# Patient Record
Sex: Male | Born: 1960
Health system: Southern US, Community
[De-identification: ages and names within clinical notes are randomized; demographics above are authoritative.]

## PROBLEM LIST (undated history)

## (undated) DIAGNOSIS — Z87448 Personal history of other diseases of urinary system: Secondary | ICD-10-CM

## (undated) DIAGNOSIS — M549 Dorsalgia, unspecified: Secondary | ICD-10-CM

## (undated) DIAGNOSIS — M1990 Unspecified osteoarthritis, unspecified site: Secondary | ICD-10-CM

## (undated) DIAGNOSIS — E669 Obesity, unspecified: Secondary | ICD-10-CM

## (undated) DIAGNOSIS — M2550 Pain in unspecified joint: Secondary | ICD-10-CM

## (undated) DIAGNOSIS — I10 Essential (primary) hypertension: Secondary | ICD-10-CM

## (undated) DIAGNOSIS — R7303 Prediabetes: Secondary | ICD-10-CM

## (undated) DIAGNOSIS — R609 Edema, unspecified: Secondary | ICD-10-CM

## (undated) DIAGNOSIS — E119 Type 2 diabetes mellitus without complications: Secondary | ICD-10-CM

## (undated) HISTORY — PX: WISDOM TOOTH EXTRACTION: SHX21

## (undated) HISTORY — DX: Essential (primary) hypertension: I10

## (undated) HISTORY — DX: Prediabetes: R73.03

## (undated) HISTORY — DX: Edema, unspecified: R60.9

## (undated) HISTORY — DX: Obesity, unspecified: E66.9

## (undated) HISTORY — PX: OTHER SURGICAL HISTORY: SHX169

## (undated) HISTORY — DX: Pain in unspecified joint: M25.50

## (undated) HISTORY — DX: Personal history of other diseases of urinary system: Z87.448

## (undated) HISTORY — DX: Dorsalgia, unspecified: M54.9

---
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
## 2003-10-15 ENCOUNTER — Ambulatory Visit (HOSPITAL_COMMUNITY): Admission: RE | Admit: 2003-10-15 | Discharge: 2003-10-15 | Payer: Self-pay | Admitting: Internal Medicine

## 2004-07-21 ENCOUNTER — Ambulatory Visit: Payer: Self-pay | Admitting: Internal Medicine

## 2004-09-22 ENCOUNTER — Ambulatory Visit: Payer: Self-pay | Admitting: Internal Medicine

## 2005-08-17 ENCOUNTER — Ambulatory Visit: Payer: Self-pay | Admitting: Internal Medicine

## 2006-10-30 ENCOUNTER — Ambulatory Visit: Payer: Self-pay | Admitting: Internal Medicine

## 2006-12-25 ENCOUNTER — Ambulatory Visit: Payer: Self-pay | Admitting: Internal Medicine

## 2006-12-25 LAB — CONVERTED CEMR LAB
ALT: 32 units/L (ref 0–53)
AST: 32 units/L (ref 0–37)
Basophils Relative: 0.6 % (ref 0.0–1.0)
Bilirubin, Direct: 0.2 mg/dL (ref 0.0–0.3)
CO2: 26 meq/L (ref 19–32)
Calcium: 9.2 mg/dL (ref 8.4–10.5)
Chloride: 108 meq/L (ref 96–112)
Creatinine, Ser: 0.8 mg/dL (ref 0.4–1.5)
Eosinophils Relative: 0.9 % (ref 0.0–5.0)
GFR calc Af Amer: 134 mL/min
Glucose, Bld: 110 mg/dL — ABNORMAL HIGH (ref 70–99)
Leukocytes, UA: NEGATIVE
Lymphocytes Relative: 33.8 % (ref 12.0–46.0)
Neutro Abs: 3.2 10*3/uL (ref 1.4–7.7)
Nitrite: NEGATIVE
Platelets: 168 10*3/uL (ref 150–400)
RDW: 13.1 % (ref 11.5–14.6)
Specific Gravity, Urine: >=1.03 (ref 1.000–1.03)
Total Bilirubin: 1.2 mg/dL (ref 0.3–1.2)
Total Protein, Urine: NEGATIVE mg/dL
Total Protein: 6.7 g/dL (ref 6.0–8.3)
Triglycerides: 336 mg/dL (ref 0–149)
Urine Glucose: NEGATIVE mg/dL
WBC: 5.5 10*3/uL (ref 4.5–10.5)

## 2006-12-31 ENCOUNTER — Ambulatory Visit: Payer: Self-pay | Admitting: Internal Medicine

## 2007-05-01 ENCOUNTER — Telehealth: Payer: Self-pay | Admitting: Internal Medicine

## 2007-07-03 ENCOUNTER — Encounter: Payer: Self-pay | Admitting: Internal Medicine

## 2007-07-03 DIAGNOSIS — I10 Essential (primary) hypertension: Secondary | ICD-10-CM | POA: Insufficient documentation

## 2007-07-03 DIAGNOSIS — E781 Pure hyperglyceridemia: Secondary | ICD-10-CM | POA: Insufficient documentation

## 2007-11-07 ENCOUNTER — Ambulatory Visit: Payer: Self-pay | Admitting: Internal Medicine

## 2007-11-07 DIAGNOSIS — F988 Other specified behavioral and emotional disorders with onset usually occurring in childhood and adolescence: Secondary | ICD-10-CM | POA: Insufficient documentation

## 2007-11-07 DIAGNOSIS — E669 Obesity, unspecified: Secondary | ICD-10-CM

## 2009-01-04 ENCOUNTER — Encounter (INDEPENDENT_AMBULATORY_CARE_PROVIDER_SITE_OTHER): Payer: Self-pay | Admitting: *Deleted

## 2009-01-26 ENCOUNTER — Ambulatory Visit: Payer: Self-pay | Admitting: Internal Medicine

## 2009-01-28 LAB — CONVERTED CEMR LAB
ALT: 38 units/L (ref 0–53)
AST: 35 units/L (ref 0–37)
Albumin: 4 g/dL (ref 3.5–5.2)
Alkaline Phosphatase: 57 units/L (ref 39–117)
BUN: 11 mg/dL (ref 6–23)
Basophils Absolute: 0 10*3/uL (ref 0.0–0.1)
Bilirubin Urine: NEGATIVE
Bilirubin, Direct: 0.2 mg/dL (ref 0.0–0.3)
CO2: 26 meq/L (ref 19–32)
Calcium: 9.1 mg/dL (ref 8.4–10.5)
Chloride: 108 meq/L (ref 96–112)
Creatinine, Ser: 0.9 mg/dL (ref 0.4–1.5)
Eosinophils Absolute: 0 10*3/uL (ref 0.0–0.7)
GFR calc non Af Amer: 95.92 mL/min (ref >60)
Glucose, Bld: 113 mg/dL — ABNORMAL HIGH (ref 70–99)
HDL: 30.9 mg/dL — ABNORMAL LOW (ref >39.00)
Hemoglobin, Urine: NEGATIVE
Ketones, ur: NEGATIVE mg/dL
Leukocytes, UA: NEGATIVE
Lymphocytes Relative: 35 % (ref 12.0–46.0)
Lymphs Abs: 1.9 10*3/uL (ref 0.7–4.0)
MCHC: 35 g/dL (ref 30.0–36.0)
MCV: 98.3 fL (ref 78.0–100.0)
Monocytes Absolute: 0.4 10*3/uL (ref 0.1–1.0)
Neutro Abs: 3 10*3/uL (ref 1.4–7.7)
Neutrophils Relative %: 57.1 % (ref 43.0–77.0)
RBC: 4.71 M/uL (ref 4.22–5.81)
RDW: 13.2 % (ref 11.5–14.6)
Specific Gravity, Urine: 1.025 (ref 1.000–1.030)
TSH: 1.97 microintl units/mL (ref 0.35–5.50)
Total Protein: 6.9 g/dL (ref 6.0–8.3)
Triglycerides: 178 mg/dL — ABNORMAL HIGH (ref 0.0–149.0)
Urine Glucose: NEGATIVE mg/dL
VLDL: 35.6 mg/dL (ref 0.0–40.0)
pH: 6 (ref 5.0–8.0)

## 2009-03-06 ENCOUNTER — Emergency Department (HOSPITAL_COMMUNITY): Admission: EM | Admit: 2009-03-06 | Discharge: 2009-03-06 | Payer: Self-pay | Admitting: Family Medicine

## 2009-06-16 ENCOUNTER — Telehealth: Payer: Self-pay | Admitting: Internal Medicine

## 2009-09-05 ENCOUNTER — Ambulatory Visit: Payer: Self-pay | Admitting: Internal Medicine

## 2009-09-05 DIAGNOSIS — L723 Sebaceous cyst: Secondary | ICD-10-CM | POA: Insufficient documentation

## 2009-09-05 DIAGNOSIS — M25559 Pain in unspecified hip: Secondary | ICD-10-CM | POA: Insufficient documentation

## 2010-03-02 ENCOUNTER — Ambulatory Visit: Payer: Self-pay | Admitting: Internal Medicine

## 2010-03-02 LAB — CONVERTED CEMR LAB
AST: 26 units/L (ref 0–37)
Alkaline Phosphatase: 65 units/L (ref 39–117)
Basophils Relative: 0.7 % (ref 0.0–3.0)
Bilirubin, Direct: 0.3 mg/dL (ref 0.0–0.3)
Calcium: 9.1 mg/dL (ref 8.4–10.5)
Creatinine, Ser: 0.8 mg/dL (ref 0.4–1.5)
Eosinophils Absolute: 0.1 10*3/uL (ref 0.0–0.7)
Eosinophils Relative: 0.9 % (ref 0.0–5.0)
GFR calc non Af Amer: 103.39 mL/min (ref >60)
HDL: 29.8 mg/dL — ABNORMAL LOW (ref >39.00)
Hemoglobin, Urine: NEGATIVE
Hemoglobin: 16.3 g/dL (ref 13.0–17.0)
Ketones, ur: NEGATIVE mg/dL
LDL Cholesterol: 85 mg/dL (ref 0–99)
Lymphocytes Relative: 31.8 % (ref 12.0–46.0)
MCHC: 36 g/dL (ref 30.0–36.0)
Monocytes Relative: 7.9 % (ref 3.0–12.0)
Neutro Abs: 4.4 10*3/uL (ref 1.4–7.7)
Neutrophils Relative %: 58.7 % (ref 43.0–77.0)
PSA: 0.62 ng/mL (ref 0.10–4.00)
RBC: 4.64 M/uL (ref 4.22–5.81)
Sodium: 138 meq/L (ref 135–145)
Total CHOL/HDL Ratio: 5
Total Protein, Urine: NEGATIVE mg/dL
Total Protein: 6.5 g/dL (ref 6.0–8.3)
Triglycerides: 121 mg/dL (ref 0.0–149.0)
Urine Glucose: NEGATIVE mg/dL
VLDL: 24.2 mg/dL (ref 0.0–40.0)
WBC: 7.5 10*3/uL (ref 4.5–10.5)

## 2010-03-08 ENCOUNTER — Ambulatory Visit: Payer: Self-pay | Admitting: Internal Medicine

## 2010-03-08 ENCOUNTER — Encounter: Payer: Self-pay | Admitting: Internal Medicine

## 2010-03-08 DIAGNOSIS — E785 Hyperlipidemia, unspecified: Secondary | ICD-10-CM

## 2010-07-20 NOTE — Assessment & Plan Note (Signed)
Summary: CPX / NWS  #   Vital Signs:  Patient profile:   50 year old male Height:      72 inches Weight:      343 pounds BMI:     46.69 Temp:     98.0 degrees F oral Pulse rate:   84 / minute Pulse rhythm:   regular Resp:     16 per minute BP sitting:   130 / 98  (left arm) Cuff size:   large  Vitals Entered By: Lanier Prude, Beverly Gust) (March 08, 2010 3:15 PM) CC: CPX Is Patient Diabetic? No   CC:  CPX.  History of Present Illness: The patient presents for a preventive health examination   Current Medications (verified): 1)  Exforge 5-320 Mg  Tabs (Amlodipine Besylate-Valsartan) .Marland Kitchen.. 1 Once Daily 2)  Fish Oil   Oil (Fish Oil) 3)  Vitamin D3 1000 Unit  Tabs (Cholecalciferol) .Marland Kitchen.. 1 By Mouth Daily in Winter 4)  Ibuprofen 600 Mg Tabs (Ibuprofen) .Marland Kitchen.. 1 By Mouth Bid  Pc X 1 Wk Then As Needed For  Pain  Allergies (verified): 1)  ! 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         Joints hurt at times  Physical Exam  General:  Well-developed, in no  acute distress; alert,appropriate and cooperative throughout examination overweight-appearing.   Head:  Normocephalic and atraumatic without obvious abnormalities. No apparent alopecia or balding. Eyes:  No corneal or conjunctival inflammation noted. EOMI. Perrla. Ears:  External ear exam shows no significant lesions or deformities.  Otoscopic examination reveals clear canals, tympanic membranes are intact bilaterally without bulging, retraction, inflammation or discharge. Hearing is grossly normal bilaterally. Nose:  External nasal examination shows no deformity or inflammation. Nasal mucosa are pink and moist without lesions or exudates. Mouth:  Oral mucosa and oropharynx without lesions or exudates.  Teeth in good repair. Neck:  No deformities, masses, or tenderness noted. Lungs:  Normal respiratory effort, chest expands symmetrically. Lungs are clear to auscultation, no crackles or wheezes. Heart:  Normal rate and regular rhythm. S1 and S2 normal without gallop, murmur, click, rub or other extra sounds. Abdomen:  Bowel sounds positive,abdomen soft and non-tender without masses, organomegaly or hernias noted. Rectal:  No external abnormalities noted. Normal sphincter tone. No rectal masses or tenderness. Prostate:  Prostate gland firm and smooth, no enlargement, nodularity, tenderness, mass, asymmetry or induration. Msk:  No deformity or scoliosis noted of thoracic or lumbar spine.   Extremities:  No clubbing, cyanosis, edema, or deformity noted with normal full range of motion of all joints.   Neurologic:  No cranial nerve deficits noted. Station and gait are normal. Plantar reflexes are down-going bilaterally. DTRs are symmetrical throughout. Sensory, motor and coordinative functions appear intact. Skin:  Intact without suspicious lesions or rashes Cervical Nodes:  No  lymphadenopathy noted Psych:  Cognition and judgment appear intact. Alert and cooperative with normal attention span and  concentration. No apparent delusions, illusions, hallucinations   Impression & Recommendations:  Problem # 1:  HEALTH MAINTENANCE EXAM (ICD-V70.0) Assessment New  The labs were reviewed with the patient.   Orders: EKG w/ Interpretation (93000)  Problem # 2:  HYPERTENSION (ICD-401.9) Assessment: Unchanged  The following medications were removed from the medication list:    Exforge 5-320 Mg Tabs (Amlodipine besylate-valsartan) .Marland Kitchen... 1 once daily His updated medication list for this problem includes:    Exforge 10-320 Mg Tabs (Amlodipine besylate-valsartan) .Marland Kitchen... 1 by mouth qd  Problem # 3:  OBESITY (ICD-278.00) Assessment: Unchanged Diet discussed  Problem # 4:  DYSLIPIDEMIA (ICD-272.4) - low HDL Assessment: Unchanged  Problem # 5:  HYPERTRIGLYCERIDEMIA (ICD-272.1) Assessment: Improved  Labs Reviewed: SGOT: 26 (03/02/2010)   SGPT: 25 (03/02/2010)   HDL:29.80 (03/02/2010), 30.90 (01/26/2009)  LDL:85 (03/02/2010), 84 (01/26/2009)  Chol:139 (03/02/2010), 150 (01/26/2009)  Trig:121.0 (03/02/2010), 178.0 (01/26/2009)  Complete Medication List: 1)  Fish Oil Oil (Fish oil) 2)  Vitamin D3 1000 Unit Tabs (Cholecalciferol) .Marland Kitchen.. 1 by mouth daily in winter 3)  Ibuprofen 600 Mg Tabs (Ibuprofen) .Marland Kitchen.. 1 by mouth bid  pc x 1 wk then as needed for  pain 4)  Exforge 10-320 Mg Tabs (Amlodipine besylate-valsartan) .Marland Kitchen.. 1 by mouth qd  Patient Instructions: 1)  Use stretching and balance exercises that I have provided (15 min. or longer every day)  2)  Go on Youtube (www.youtube.com) and look up "piriformis stretch", "Ileopsoas stretch", "IT band stretch" and "gluteus stretch". See the anatomy and learn the symptoms.  You can try to self-diagnose.Do the stretches - it may help!  3)  Please schedule a follow-up appointment in 6 months. Prescriptions: IBUPROFEN 600 MG TABS (IBUPROFEN) 1 by mouth bid  pc x 1 wk then as needed for  pain  #60 x 3   Entered and Authorized by:   Tresa Garter  MD   Signed by:   Tresa Garter MD on 03/08/2010   Method used:   Print then Give to Patient   RxID:   [id number] EXFORGE 10-320 MG TABS (AMLODIPINE BESYLATE-VALSARTAN) 1 by mouth qd  #90 x 3   Entered and Authorized by:   Tresa Garter MD   Signed by:   Tresa Garter MD on 03/08/2010   Method used:   Print then Give to Patient   RxID:   [id number]

## 2010-07-20 NOTE — Assessment & Plan Note (Signed)
Summary: BOIL ON LEFT SHOULDER/#/CD   Vital Signs:  Patient profile:   50 year old male Weight:      345 pounds Temp:     97 degrees F oral Pulse rate:   85 / minute BP sitting:   148 / 88  (left arm)  Vitals Entered By: Tora Perches (September 05, 2009 4:01 PM) CC: boil on left shoulder Is Patient Diabetic? No   CC:  boil on left shoulder.  History of Present Illness: C/o boil on L shoulder x 1-2 wks, wife has beeen squeezing out smelly pus; no pain or redness. F/u HTN C/o L hip pain x 1-2 months worse w/pressure.  Preventive Screening-Counseling & Management  Alcohol-Tobacco     Smoking Status: never  Current Medications (verified): 1)  Exforge 5-320 Mg  Tabs (Amlodipine Besylate-Valsartan) .Marland Kitchen.. 1 Once Daily 2)  Fish Oil   Oil (Fish Oil) 3)  Vitamin D3 1000 Unit  Tabs (Cholecalciferol) .Marland Kitchen.. 1 By Mouth Daily in Winter  Allergies: 1)  ! 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  The patient denies fever.    Physical Exam  General:  Well-developed,well-nourished,in no acute distress; alert,appropriate and cooperative throughout examination Mouth:  Oral mucosa and oropharynx without lesions or exudates.  Teeth in good repair. Lungs:  Normal respiratory effort, chest expands symmetrically. Lungs are clear to auscultation, no crackles or wheezes. Heart:  Normal rate and regular rhythm. S1 and S2 normal without gallop, murmur, click, rub or other extra sounds. Msk:  L troch major is tender to palp.; hip OK Neurologic:  No cranial nerve deficits noted. Station and gait are normal. Plantar reflexes are down-going bilaterally. DTRs are symmetrical throughout. Sensory,  motor and coordinative functions appear intact. Skin:  No skin redness over L shoulder. There is a 1 mm pore on poster shoulder on L w/firmer deep  tissue underneath. When squeezed - thick malodorous pus woud come out - 1 cc; NT   Impression & Recommendations:  Problem # 1:  SEBACEOUS CYST, INFECTED (ICD-706.2) Assessment New It is getting better. I do not think he needs D&C now. Will treat w/Doxy. Surg consult when he wishes a permanent removal Call if you are not better in a reasonable amount of time or if worse.   Problem # 2:  HYPERTENSION (ICD-401.9) Assessment: Unchanged States BP is better at home His updated medication list for this problem includes:    Exforge 5-320 Mg Tabs (Amlodipine besylate-valsartan) .Marland Kitchen... 1 once daily  Problem # 3:  HIP PAIN (ICD-719.45) L - troch. bursitis Assessment: New See "Patient Instructions". RTC for injection if not better His updated medication list for this problem includes:    Ibuprofen 600 Mg Tabs (Ibuprofen) .Marland Kitchen... 1 by mouth bid  pc x 1 wk then as needed for  pain  Complete Medication List: 1)  Exforge 5-320 Mg Tabs (Amlodipine besylate-valsartan) .Marland Kitchen.. 1 once daily 2)  Fish Oil Oil (Fish oil) 3)  Vitamin D3 1000 Unit Tabs (Cholecalciferol) .Marland Kitchen.. 1 by mouth daily in winter 4)  Doxycycline Hyclate 100 Mg Caps (Doxycycline hyclate) .Marland Kitchen.. 1 by mouth two times a day with a glass of water 5)  Ibuprofen 600 Mg Tabs (Ibuprofen) .Marland Kitchen.. 1 by mouth  bid  pc x 1 wk then as needed for  pain  Patient Instructions: 1)  Please schedule a follow-up appointment in 6 months well w/labs. 2)  Ice to hip 3)  Keep your wallet in the front pocket 4)  Call if you are not better in a reasonable amount of time or if worse.  Prescriptions: IBUPROFEN 600 MG TABS (IBUPROFEN) 1 by mouth bid  pc x 1 wk then as needed for  pain  #60 x 3   Entered and Authorized by:   Tresa Garter MD   Signed by:   Tresa Garter MD on 09/05/2009   Method used:    [facility]        [facility] [address] [phone]* (retail)       [address]       [place], [place]  [address]       Ph: [phone] or [phone]       Fax: [phone]   RxID:   [id number] EXFORGE 5-320 MG  TABS (AMLODIPINE BESYLATE-VALSARTAN) 1 once daily  #30 x 12   Entered and Authorized by:   Tresa Garter MD   Signed by:   Tresa Garter MD on 09/05/2009   Method used:   [facility]        [facility] [address] [phone]* (retail)       [address]       [place], [place]  [address]       Ph: [phone] or [phone]       Fax: [phone]   RxID:   [id number] DOXYCYCLINE HYCLATE 100 MG CAPS (DOXYCYCLINE HYCLATE) 1 by mouth two times a day with a glass of water  #20 x 2   Entered and Authorized by:   Tresa Garter MD   Signed by:   Tresa Garter MD on 09/05/2009   Method used:   [facility]        [facility] [address] [phone]* (retail)       [address]       [place], [place]  [address]       Ph: [phone] or [phone]       Fax: [phone]   RxID:   [id number]

## 2010-09-08 ENCOUNTER — Ambulatory Visit: Payer: Self-pay | Admitting: Internal Medicine

## 2010-09-08 DIAGNOSIS — Z0289 Encounter for other administrative examinations: Secondary | ICD-10-CM

## 2010-09-11 ENCOUNTER — Other Ambulatory Visit: Payer: Self-pay | Admitting: Internal Medicine

## 2010-09-11 NOTE — Telephone Encounter (Signed)
Exforge HCT ref req does not match dose in [facility]...please advise which dose is correct? Exforge 5-320or 10-320mg .

## 2010-09-12 ENCOUNTER — Telehealth: Payer: Self-pay | Admitting: Internal Medicine

## 2010-09-12 MED ORDER — AMLODIPINE BESYLATE-VALSARTAN 10-320 MG PO TABS: 1.0000 | ORAL_TABLET | Freq: Every day | ORAL | Status: DC

## 2010-09-12 NOTE — Telephone Encounter (Signed)
Pt is set up for f/u appt in late May but needs bp med refill now, [facility],

## 2010-09-12 NOTE — Telephone Encounter (Signed)
Patient informed. 

## 2010-09-13 NOTE — Telephone Encounter (Signed)
10-320 is correct Thank you!

## 2010-10-27 ENCOUNTER — Encounter: Payer: Self-pay | Admitting: Internal Medicine

## 2010-10-30 ENCOUNTER — Encounter: Payer: Self-pay | Admitting: Internal Medicine

## 2010-10-30 ENCOUNTER — Ambulatory Visit (INDEPENDENT_AMBULATORY_CARE_PROVIDER_SITE_OTHER): Payer: 59 | Admitting: Internal Medicine

## 2010-10-30 DIAGNOSIS — N529 Male erectile dysfunction, unspecified: Secondary | ICD-10-CM

## 2010-10-30 DIAGNOSIS — R609 Edema, unspecified: Secondary | ICD-10-CM

## 2010-10-30 DIAGNOSIS — I10 Essential (primary) hypertension: Secondary | ICD-10-CM

## 2010-10-30 MED ORDER — AMLODIPINE BESYLATE-VALSARTAN 10-320 MG PO TABS: 1.0000 | ORAL_TABLET | Freq: Every day | ORAL | Status: DC

## 2010-10-30 MED ORDER — TADALAFIL 5 MG PO TABS: 5.0000 mg | ORAL_TABLET | Freq: Every day | ORAL | Status: DC

## 2010-10-30 MED ORDER — TADALAFIL 20 MG PO TABS: 20.0000 mg | ORAL_TABLET | Freq: Every day | ORAL | Status: DC | PRN

## 2010-10-30 NOTE — Assessment & Plan Note (Signed)
On Rx. Labs ordered

## 2010-10-30 NOTE — Assessment & Plan Note (Signed)
Poss related to Norvasc

## 2010-10-30 NOTE — Progress Notes (Signed)
  Subjective:    Patient ID: Joel Medina, male    DOB: [birth year], 50 y.o.   MRN: [id number]  HPI  The patient presents for a follow-up of  chronic hypertension  C/o ED - recent and partial.   Review of Systems  Constitutional: Negative for diaphoresis.  HENT: Negative for congestion.   Eyes: Negative for photophobia.  Respiratory: Negative for cough and shortness of breath.   Neurological: Negative for tremors.   Wt Readings from Last 3 Encounters:  10/30/10 344 lb (156.037 kg)  03/08/10 343 lb (155.584 kg)  09/05/09 345 lb (156.491 kg)       Objective:   Physical Exam  Constitutional: He is oriented to person, place, and time. He appears well-developed.       Obese  HENT:  Mouth/Throat: Oropharynx is clear and moist.  Eyes: Conjunctivae are normal. Pupils are equal, round, and reactive to light.  Neck: Normal range of motion. No JVD present. No thyromegaly present.  Cardiovascular: Normal rate, regular rhythm, normal heart sounds and intact distal pulses.  Exam reveals no gallop and no friction rub.   No murmur heard. Pulmonary/Chest: Effort normal and breath sounds normal. No respiratory distress. He has no wheezes. He has no rales. He exhibits no tenderness.  Abdominal: Soft. Bowel sounds are normal. He exhibits no distension and no mass. There is no tenderness. There is no rebound and no guarding.  Musculoskeletal: Normal range of motion. He exhibits edema (trace B). He exhibits no tenderness.  Lymphadenopathy:    He has no cervical adenopathy.  Neurological: He is alert and oriented to person, place, and time. He has normal reflexes. No cranial nerve deficit. He exhibits normal muscle tone. Coordination normal.  Skin: Skin is warm and dry. No rash noted.  Psychiatric: He has a normal mood and affect. His behavior is normal. Judgment and thought content normal.          A/P:  Edema Poss related to Norvasc  HYPERTENSION On Rx. Labs ordered    ED  See  meds

## 2010-10-31 NOTE — Assessment & Plan Note (Signed)
[facility]                           PRIMARY CARE OFFICE NOTE   FORNEY, KLEINPETER                        MRN:          [id number]  DATE:12/31/2006                            DOB:          [birth year]    REASON FOR VISIT:  The patient is a 50 year old male who presents for  wellness examination.   CURRENT MEDICATIONS:  Exforge 320/5 daily.   PAST MEDICAL HISTORY/FAMILY/SOCIAL HISTORY:  As per March 17, 2002  note.  He continues to work for the city.   REVIEW OF SYSTEMS:  No chest pain or shortness of breath.  No syncope.  No neurological complaints.  He lost weight on a diet and has gained  some back.  The rest of the 18 point review of systems is negative.   PHYSICAL EXAMINATION:  VITAL SIGNS:  Blood pressure 143/88, pulse 75,  temperature 97.8, weight 227 pounds (was 261).  GENERAL APPEARANCE:  Looks well.  HEENT:  Moist mucosa.  NECK:  Supple.  No thyromegaly or bruits.  LUNGS:  Clear.  No wheezes or rales.  HEART:  Sounds S1, S2, no murmurs and no gallops.  ABDOMEN:  Soft, nontender without organomegaly or masses.  EXTREMITIES:  Lower extremities are without edema.  NEUROLOGICAL:  He is alert, oriented and cooperative and denies being  depressed.  GENITOURINARY:  Normal genitalia.  Testes are free of masses. No  hernias.   LABORATORY DATA:  EKG with normal sinus rhythm.  On December 25, 2006 CBC  normal.  Glucose 110.  Liver function tests normal.  Blood sugar 154.  Triglycerides 336.  HDL 27.  Urinalysis negative.  PSA normal.   ASSESSMENT/PLAN:  1. Normal wellness exam.  Age/health-related issues discussed.      Healthy lifestyle discussed.  He should continue to lose weight,      make his portions smaller.  Repeat exam in 12 months.  2. Hypertension. Continue current therapy/weight loss.  3. Elevated triglycerides, low HDL's.  Will start on Lovaza two      tablets b.i.d.  Will recheck labs in three months.     Georgina Quint.  Plotnikov, MD  Electronically Signed    AVP/MedQ  DD: 01/06/2007  DT: 01/06/2007  Job #: [id number]

## 2011-02-01 ENCOUNTER — Telehealth: Payer: Self-pay | Admitting: *Deleted

## 2011-02-01 NOTE — Telephone Encounter (Signed)
Called ins plan to intiate PA for Cialis. Form is being faxed.

## 2011-02-02 ENCOUNTER — Telehealth: Payer: Self-pay | Admitting: *Deleted

## 2011-02-02 ENCOUNTER — Other Ambulatory Visit: Payer: Self-pay | Admitting: *Deleted

## 2011-02-02 MED ORDER — TADALAFIL 5 MG PO TABS: 5.0000 mg | ORAL_TABLET | Freq: Every day | ORAL | Status: DC

## 2011-02-02 NOTE — Telephone Encounter (Signed)
Pt informed PA for Cialis # 30 for 30 days  Will not be approved unless he has BPH. I advised him that I can send in a new rx for Cialis # 8 and ins should cover it. Pt understands.

## 2011-04-23 ENCOUNTER — Other Ambulatory Visit: Payer: Self-pay | Admitting: *Deleted

## 2011-04-23 DIAGNOSIS — Z125 Encounter for screening for malignant neoplasm of prostate: Secondary | ICD-10-CM

## 2011-04-23 DIAGNOSIS — Z0000 Encounter for general adult medical examination without abnormal findings: Secondary | ICD-10-CM

## 2011-04-30 ENCOUNTER — Other Ambulatory Visit (INDEPENDENT_AMBULATORY_CARE_PROVIDER_SITE_OTHER): Payer: 59

## 2011-04-30 ENCOUNTER — Other Ambulatory Visit: Payer: Self-pay | Admitting: Internal Medicine

## 2011-04-30 DIAGNOSIS — Z0000 Encounter for general adult medical examination without abnormal findings: Secondary | ICD-10-CM

## 2011-04-30 DIAGNOSIS — Z125 Encounter for screening for malignant neoplasm of prostate: Secondary | ICD-10-CM

## 2011-05-01 LAB — HEPATIC FUNCTION PANEL
ALT: 27 U/L (ref 0–53)
AST: 23 U/L (ref 0–37)
Albumin: 3.8 g/dL (ref 3.5–5.2)
Alkaline Phosphatase: 61 U/L (ref 39–117)
Bilirubin, Direct: 0.1 mg/dL (ref 0.0–0.3)
Total Bilirubin: 0.8 mg/dL (ref 0.3–1.2)
Total Protein: 6.5 g/dL (ref 6.0–8.3)

## 2011-05-01 LAB — URINALYSIS, ROUTINE W REFLEX MICROSCOPIC
Bilirubin Urine: NEGATIVE
Ketones, ur: NEGATIVE
Leukocytes, UA: NEGATIVE
Nitrite: NEGATIVE
Specific Gravity, Urine: 1.025
Total Protein, Urine: NEGATIVE
Urine Glucose: NEGATIVE
Urobilinogen, UA: 0.2
pH: 6 (ref 5.0–8.0)

## 2011-05-01 LAB — BASIC METABOLIC PANEL
GFR: 124.92 mL/min (ref >60.00)
Potassium: 4.2 mEq/L (ref 3.5–5.1)
Sodium: 138 mEq/L (ref 135–145)

## 2011-05-01 LAB — CBC WITH DIFFERENTIAL/PLATELET
Basophils Relative: 0.4 % (ref 0.0–3.0)
HCT: 44.4 % (ref 39.0–52.0)
Hemoglobin: 15.6 g/dL (ref 13.0–17.0)
Lymphocytes Relative: 37 % (ref 12.0–46.0)
Lymphs Abs: 2.1 10*3/uL (ref 0.7–4.0)
Monocytes Relative: 6.9 % (ref 3.0–12.0)
Neutro Abs: 3.1 10*3/uL (ref 1.4–7.7)
RBC: 4.52 Mil/uL (ref 4.22–5.81)

## 2011-05-01 LAB — LIPID PANEL
Cholesterol: 154 mg/dL (ref 0–200)
HDL: 34 mg/dL — ABNORMAL LOW
Total CHOL/HDL Ratio: 5
Triglycerides: 233 mg/dL — ABNORMAL HIGH (ref 0.0–149.0)
VLDL: 46.6 mg/dL — ABNORMAL HIGH (ref 0.0–40.0)

## 2011-05-01 LAB — PSA: PSA: 0.62 ng/mL (ref 0.10–4.00)

## 2011-05-01 LAB — TSH: TSH: 2.52 u[IU]/mL (ref 0.35–5.50)

## 2011-05-02 ENCOUNTER — Other Ambulatory Visit: Payer: 59

## 2011-05-04 ENCOUNTER — Ambulatory Visit: Payer: 59 | Admitting: Internal Medicine

## 2011-05-04 ENCOUNTER — Ambulatory Visit (INDEPENDENT_AMBULATORY_CARE_PROVIDER_SITE_OTHER): Payer: 59 | Admitting: Internal Medicine

## 2011-05-04 ENCOUNTER — Encounter: Payer: Self-pay | Admitting: Internal Medicine

## 2011-05-04 VITALS — BP 140/72 | HR 80 | Temp 98.0°F | Resp 16 | Wt 358.0 lb

## 2011-05-04 DIAGNOSIS — I10 Essential (primary) hypertension: Secondary | ICD-10-CM

## 2011-05-04 DIAGNOSIS — N400 Enlarged prostate without lower urinary tract symptoms: Secondary | ICD-10-CM

## 2011-05-04 DIAGNOSIS — N529 Male erectile dysfunction, unspecified: Secondary | ICD-10-CM

## 2011-05-04 DIAGNOSIS — Z0000 Encounter for general adult medical examination without abnormal findings: Secondary | ICD-10-CM

## 2011-05-04 DIAGNOSIS — R609 Edema, unspecified: Secondary | ICD-10-CM

## 2011-05-04 DIAGNOSIS — R739 Hyperglycemia, unspecified: Secondary | ICD-10-CM

## 2011-05-04 MED ORDER — TADALAFIL 5 MG PO TABS: 5.0000 mg | ORAL_TABLET | Freq: Every day | ORAL | Status: DC

## 2011-05-04 MED ORDER — AMLODIPINE-VALSARTAN-HCTZ 10-320-25 MG PO TABS: 1.0000 | ORAL_TABLET | Freq: Every day | ORAL | Status: DC

## 2011-05-04 NOTE — Progress Notes (Signed)
  Subjective:    Patient ID: Joel Medina, male    DOB: 03/05/1961, 50 y.o.   MRN: [id number]  HPI  The patient is here for a wellness exam. The patient has been doing well overall without major physical or psychological issues going on lately. The patient needs to address  chronic hypertension that has been well controlled with medicines; to address chronic  hyperlipidemia controlled with medicines as well; and to address type 2 chronic diabetes, controlled with medical treatment and diet.   Review of Systems  Constitutional: Negative for appetite change, fatigue and unexpected weight change.  HENT: Negative for nosebleeds, congestion, sore throat, sneezing, trouble swallowing and neck pain.   Eyes: Negative for itching and visual disturbance.  Respiratory: Negative for cough.   Cardiovascular: Negative for chest pain, palpitations and leg swelling.  Gastrointestinal: Negative for nausea, diarrhea, blood in stool and abdominal distention.  Genitourinary: Negative for frequency and hematuria.  Musculoskeletal: Negative for back pain, joint swelling and gait problem.  Skin: Negative for rash.  Neurological: Negative for dizziness, tremors, speech difficulty and weakness.  Psychiatric/Behavioral: Negative for sleep disturbance, dysphoric mood and agitation. The patient is not nervous/anxious.   B shins with 1+ edema BP Readings from Last 3 Encounters:  05/04/11 140/72  10/30/10 130/82  03/08/10 130/98   Wt Readings from Last 3 Encounters:  05/04/11 358 lb (162.388 kg)  10/30/10 344 lb (156.037 kg)  03/08/10 343 lb (155.584 kg)        Objective:   Physical Exam  Constitutional: He is oriented to person, place, and time. He appears well-developed.       Obese  HENT:  Mouth/Throat: Oropharynx is clear and moist.  Eyes: Conjunctivae are normal. Pupils are equal, round, and reactive to light.  Neck: Normal range of motion. No JVD present. No thyromegaly present.  Cardiovascular:  Normal rate, regular rhythm, normal heart sounds and intact distal pulses.  Exam reveals no gallop and no friction rub.   No murmur heard. Pulmonary/Chest: Effort normal and breath sounds normal. No respiratory distress. He has no wheezes. He has no rales. He exhibits no tenderness.  Abdominal: Soft. Bowel sounds are normal. He exhibits no distension and no mass. There is no tenderness. There is no rebound and no guarding.  Musculoskeletal: Normal range of motion. He exhibits no edema and no tenderness.  Lymphadenopathy:    He has no cervical adenopathy.  Neurological: He is alert and oriented to person, place, and time. He has normal reflexes. No cranial nerve deficit. He exhibits normal muscle tone. Coordination normal.  Skin: Skin is warm and dry. No rash noted.  Psychiatric: He has a normal mood and affect. His behavior is normal. Judgment and thought content normal.     Lab Results  Component Value Date   WBC 5.8 04/30/2011   HGB 15.6 04/30/2011   HCT 44.4 04/30/2011   PLT 171.0 04/30/2011   GLUCOSE 137* 04/30/2011   CHOL 154 04/30/2011   TRIG 233.0* 04/30/2011   HDL 34.00* 04/30/2011   LDLDIRECT 94.4 04/30/2011   LDLCALC 85 03/02/2010   ALT 27 04/30/2011   AST 23 04/30/2011   NA 138 04/30/2011   K 4.2 04/30/2011   CL 106 04/30/2011   CREATININE 0.7 04/30/2011   BUN 12 04/30/2011   CO2 25 04/30/2011   TSH 2.52 04/30/2011   PSA 0.62 04/30/2011        Assessment & Plan:    Check CBG at home

## 2011-05-06 ENCOUNTER — Encounter: Payer: Self-pay | Admitting: Internal Medicine

## 2011-05-06 DIAGNOSIS — Z0000 Encounter for general adult medical examination without abnormal findings: Secondary | ICD-10-CM | POA: Insufficient documentation

## 2011-05-06 NOTE — Assessment & Plan Note (Signed)
See Meds 

## 2011-05-06 NOTE — Assessment & Plan Note (Addendum)
BP Readings from Last 3 Encounters:  05/04/11 140/72  10/30/10 130/82  03/08/10 130/98   See Med changes

## 2011-05-06 NOTE — Assessment & Plan Note (Signed)
We discussed age appropriate health related issues, including available/recomended screening tests and vaccinations. We discussed a need for adhering to healthy diet and exercise. Labs/EKG were reviewed/ordered. All questions were answered.   

## 2011-05-06 NOTE — Assessment & Plan Note (Signed)
We can try daily Cialis

## 2011-05-24 ENCOUNTER — Telehealth: Payer: Self-pay | Admitting: *Deleted

## 2011-05-24 NOTE — Telephone Encounter (Signed)
Called pt's rx ins number to check status was transferred X 3. Need to call [employer] [phone]

## 2011-06-07 ENCOUNTER — Telehealth: Payer: Self-pay | Admitting: *Deleted

## 2011-06-07 MED ORDER — VALSARTAN-HYDROCHLOROTHIAZIDE 320-25 MG PO TABS: 1.0000 | ORAL_TABLET | Freq: Every day | ORAL | Status: DC

## 2011-06-07 MED ORDER — AMLODIPINE BESYLATE 10 MG PO TABS: 10.0000 mg | ORAL_TABLET | Freq: Every day | ORAL | Status: DC

## 2011-06-07 NOTE — Telephone Encounter (Signed)
Pt's wife called on behalf pt stating that Exforge will no longer be covered on his insurance as of January 1. Formulary alternatives are Norvasc or Losartan, Benicar, Micardis, or Diovan. [facility]

## 2011-06-08 MED ORDER — VALSARTAN-HYDROCHLOROTHIAZIDE 320-25 MG PO TABS: 1.0000 | ORAL_TABLET | Freq: Every day | ORAL | Status: DC

## 2011-06-08 MED ORDER — AMLODIPINE BESYLATE 10 MG PO TABS: 10.0000 mg | ORAL_TABLET | Freq: Every day | ORAL | Status: DC

## 2011-06-08 NOTE — Telephone Encounter (Signed)
Pt informed of new BP meds.

## 2011-06-20 NOTE — Telephone Encounter (Signed)
Per Truddie Hidden Public affairs consultant) I called pt re: questions about his new BP meds that were sent in. I spoke to pt. He wanted to know if he needed both amlodipine and Diovan HCTZ. I advised him that the Diovan has a diuretic and yes he needs both since Dr. Posey Rea sent both. Pt understands.

## 2011-06-27 ENCOUNTER — Telehealth: Payer: Self-pay | Admitting: *Deleted

## 2011-06-27 MED ORDER — OLMESARTAN MEDOXOMIL-HCTZ 40-25 MG PO TABS: 1.0000 | ORAL_TABLET | Freq: Every day | ORAL | Status: DC

## 2011-06-27 NOTE — Telephone Encounter (Signed)
Ok benicar hct

## 2011-06-27 NOTE — Telephone Encounter (Signed)
Pt's wife states that [facility] that [employer] will not cover Diovan HCT [they will cover Benicar HCT and/or Micardis HCT] Please advise.

## 2011-06-28 NOTE — Telephone Encounter (Signed)
Patient informed change in Rx sent to pharmacy.

## 2011-11-02 ENCOUNTER — Encounter: Payer: Self-pay | Admitting: Internal Medicine

## 2011-11-02 ENCOUNTER — Ambulatory Visit (INDEPENDENT_AMBULATORY_CARE_PROVIDER_SITE_OTHER): Payer: 59 | Admitting: Internal Medicine

## 2011-11-02 VITALS — BP 122/80 | HR 80 | Temp 98.6°F | Resp 16 | Ht 75.0 in | Wt 341.0 lb

## 2011-11-02 DIAGNOSIS — E669 Obesity, unspecified: Secondary | ICD-10-CM

## 2011-11-02 DIAGNOSIS — R609 Edema, unspecified: Secondary | ICD-10-CM

## 2011-11-02 DIAGNOSIS — I10 Essential (primary) hypertension: Secondary | ICD-10-CM

## 2011-11-02 DIAGNOSIS — N529 Male erectile dysfunction, unspecified: Secondary | ICD-10-CM

## 2011-11-02 MED ORDER — TADALAFIL 20 MG PO TABS: 20.0000 mg | ORAL_TABLET | Freq: Every day | ORAL | Status: DC | PRN

## 2011-11-02 MED ORDER — OLMESARTAN MEDOXOMIL-HCTZ 40-25 MG PO TABS: 1.0000 | ORAL_TABLET | Freq: Every day | ORAL | Status: DC

## 2011-11-02 MED ORDER — AMLODIPINE BESYLATE 10 MG PO TABS: 10.0000 mg | ORAL_TABLET | Freq: Every day | ORAL | Status: DC

## 2011-11-02 NOTE — Progress Notes (Signed)
Patient ID: Joel Medina, male   DOB: 01/15/1961, 51 y.o.   MRN: [id number]  Subjective:    Patient ID: Joel Medina, male    DOB: 05/31/1961, 51 y.o.   MRN: [id number]  HPI   The patient needs to address  chronic hypertension that has been well controlled with medicines; to address chronic  hyperlipidemia controlled with medicines as well; and to address type 2 pre diabetes, ED, obesity He lost wt - new job, eating less  Review of Systems  Constitutional: Negative for appetite change, fatigue and unexpected weight change.  HENT: Negative for nosebleeds, congestion, sore throat, sneezing, trouble swallowing and neck pain.   Eyes: Negative for itching and visual disturbance.  Respiratory: Negative for cough.   Cardiovascular: Negative for chest pain, palpitations and leg swelling.  Gastrointestinal: Negative for nausea, diarrhea, blood in stool and abdominal distention.  Genitourinary: Negative for frequency and hematuria.  Musculoskeletal: Negative for back pain, joint swelling and gait problem.  Skin: Negative for rash.  Neurological: Negative for dizziness, tremors, speech difficulty and weakness.  Psychiatric/Behavioral: Negative for sleep disturbance, dysphoric mood and agitation. The patient is not nervous/anxious.   B shins with trace edema BP Readings from Last 3 Encounters:  11/02/11 122/80  05/04/11 140/72  10/30/10 130/82   Wt Readings from Last 3 Encounters:  11/02/11 341 lb (154.677 kg)  05/04/11 358 lb (162.388 kg)  10/30/10 344 lb (156.037 kg)        Objective:   Physical Exam  Constitutional: He is oriented to person, place, and time. He appears well-developed.       Obese  HENT:  Mouth/Throat: Oropharynx is clear and moist.  Eyes: Conjunctivae are normal. Pupils are equal, round, and reactive to light.  Neck: Normal range of motion. No JVD present. No thyromegaly present.  Cardiovascular: Normal rate, regular rhythm, normal heart sounds and intact  distal pulses.  Exam reveals no gallop and no friction rub.   No murmur heard. Pulmonary/Chest: Effort normal and breath sounds normal. No respiratory distress. He has no wheezes. He has no rales. He exhibits no tenderness.  Abdominal: Soft. Bowel sounds are normal. He exhibits no distension and no mass. There is no tenderness. There is no rebound and no guarding.  Musculoskeletal: Normal range of motion. He exhibits no edema and no tenderness.  Lymphadenopathy:    He has no cervical adenopathy.  Neurological: He is alert and oriented to person, place, and time. He has normal reflexes. No cranial nerve deficit. He exhibits normal muscle tone. Coordination normal.  Skin: Skin is warm and dry. No rash noted.  Psychiatric: He has a normal mood and affect. His behavior is normal. Judgment and thought content normal.     Lab Results  Component Value Date   WBC 5.8 04/30/2011   HGB 15.6 04/30/2011   HCT 44.4 04/30/2011   PLT 171.0 04/30/2011   GLUCOSE 137* 04/30/2011   CHOL 154 04/30/2011   TRIG 233.0* 04/30/2011   HDL 34.00* 04/30/2011   LDLDIRECT 94.4 04/30/2011   LDLCALC 85 03/02/2010   ALT 27 04/30/2011   AST 23 04/30/2011   NA 138 04/30/2011   K 4.2 04/30/2011   CL 106 04/30/2011   CREATININE 0.7 04/30/2011   BUN 12 04/30/2011   CO2 25 04/30/2011   TSH 2.52 04/30/2011   PSA 0.62 04/30/2011        Assessment & Plan:    Check CBG at home

## 2011-11-05 ENCOUNTER — Encounter: Payer: Self-pay | Admitting: Internal Medicine

## 2011-11-05 NOTE — Assessment & Plan Note (Signed)
Continue with current prescription therapy as reflected on the Med list.  

## 2011-11-05 NOTE — Assessment & Plan Note (Signed)
Resolved w/wt loss 

## 2011-11-05 NOTE — Assessment & Plan Note (Signed)
Better w/wt loss  

## 2011-11-13 ENCOUNTER — Telehealth: Payer: Self-pay | Admitting: *Deleted

## 2011-11-13 DIAGNOSIS — Z0000 Encounter for general adult medical examination without abnormal findings: Secondary | ICD-10-CM

## 2011-11-13 DIAGNOSIS — Z125 Encounter for screening for malignant neoplasm of prostate: Secondary | ICD-10-CM

## 2011-11-13 NOTE — Telephone Encounter (Signed)
Message copied by Deatra James on Tue Nov 13, 2011  4:54 PM ------      Message from: Etheleen Sia      Created: Fri Nov 02, 2011  4:09 PM       PHYSICAL LABS IN [facility]

## 2011-11-13 NOTE — Telephone Encounter (Signed)
Received staff msg need cpx labs entered for November... 11/13/11@4 :54pm/LMB

## 2012-05-02 ENCOUNTER — Other Ambulatory Visit (INDEPENDENT_AMBULATORY_CARE_PROVIDER_SITE_OTHER): Payer: 59

## 2012-05-02 DIAGNOSIS — Z125 Encounter for screening for malignant neoplasm of prostate: Secondary | ICD-10-CM

## 2012-05-02 DIAGNOSIS — Z0000 Encounter for general adult medical examination without abnormal findings: Secondary | ICD-10-CM

## 2012-05-02 DIAGNOSIS — E785 Hyperlipidemia, unspecified: Secondary | ICD-10-CM

## 2012-05-02 LAB — LIPID PANEL
Cholesterol: 175 mg/dL (ref 0–200)
VLDL: 47 mg/dL — ABNORMAL HIGH (ref 0.0–40.0)

## 2012-05-02 LAB — CBC WITH DIFFERENTIAL/PLATELET
Basophils Absolute: 0.1 10*3/uL (ref 0.0–0.1)
Eosinophils Relative: 1.4 % (ref 0.0–5.0)
HCT: 45.7 % (ref 39.0–52.0)
Hemoglobin: 15.8 g/dL (ref 13.0–17.0)
Lymphocytes Relative: 38.3 % (ref 12.0–46.0)
Monocytes Relative: 7.5 % (ref 3.0–12.0)
Neutro Abs: 4.1 10*3/uL (ref 1.4–7.7)
RDW: 13 % (ref 11.5–14.6)
WBC: 7.9 10*3/uL (ref 4.5–10.5)

## 2012-05-02 LAB — URINALYSIS, ROUTINE W REFLEX MICROSCOPIC
Bilirubin Urine: NEGATIVE
Ketones, ur: NEGATIVE
Leukocytes, UA: NEGATIVE
Specific Gravity, Urine: 1.015 (ref 1.000–1.030)
Total Protein, Urine: NEGATIVE
Urine Glucose: NEGATIVE
pH: 5.5 (ref 5.0–8.0)

## 2012-05-02 LAB — HEPATIC FUNCTION PANEL
ALT: 36 U/L (ref 0–53)
Bilirubin, Direct: 0.2 mg/dL (ref 0.0–0.3)
Total Protein: 7.3 g/dL (ref 6.0–8.3)

## 2012-05-02 LAB — BASIC METABOLIC PANEL
BUN: 14 mg/dL (ref 6–23)
CO2: 26 mEq/L (ref 19–32)
Chloride: 103 mEq/L (ref 96–112)
Creatinine, Ser: 0.9 mg/dL (ref 0.4–1.5)
Glucose, Bld: 145 mg/dL — ABNORMAL HIGH (ref 70–99)
Potassium: 3.8 mEq/L (ref 3.5–5.1)

## 2012-05-02 LAB — TSH: TSH: 2.23 u[IU]/mL (ref 0.35–5.50)

## 2012-05-02 LAB — LDL CHOLESTEROL, DIRECT: Direct LDL: 103.2 mg/dL

## 2012-05-09 ENCOUNTER — Ambulatory Visit (INDEPENDENT_AMBULATORY_CARE_PROVIDER_SITE_OTHER): Payer: 59 | Admitting: Internal Medicine

## 2012-05-09 ENCOUNTER — Encounter: Payer: Self-pay | Admitting: Internal Medicine

## 2012-05-09 VITALS — BP 100/70 | HR 76 | Temp 97.3°F | Resp 16 | Ht 75.0 in | Wt 339.0 lb

## 2012-05-09 DIAGNOSIS — N400 Enlarged prostate without lower urinary tract symptoms: Secondary | ICD-10-CM

## 2012-05-09 DIAGNOSIS — I10 Essential (primary) hypertension: Secondary | ICD-10-CM

## 2012-05-09 DIAGNOSIS — Z23 Encounter for immunization: Secondary | ICD-10-CM

## 2012-05-09 DIAGNOSIS — N529 Male erectile dysfunction, unspecified: Secondary | ICD-10-CM

## 2012-05-09 DIAGNOSIS — R739 Hyperglycemia, unspecified: Secondary | ICD-10-CM

## 2012-05-09 DIAGNOSIS — E669 Obesity, unspecified: Secondary | ICD-10-CM

## 2012-05-09 DIAGNOSIS — R7309 Other abnormal glucose: Secondary | ICD-10-CM

## 2012-05-09 DIAGNOSIS — L219 Seborrheic dermatitis, unspecified: Secondary | ICD-10-CM

## 2012-05-09 DIAGNOSIS — Z0000 Encounter for general adult medical examination without abnormal findings: Secondary | ICD-10-CM

## 2012-05-09 DIAGNOSIS — E781 Pure hyperglyceridemia: Secondary | ICD-10-CM

## 2012-05-09 MED ORDER — TADALAFIL 20 MG PO TABS: 20.0000 mg | ORAL_TABLET | Freq: Every day | ORAL | Status: DC | PRN

## 2012-05-09 MED ORDER — AMLODIPINE BESYLATE 10 MG PO TABS: 10.0000 mg | ORAL_TABLET | Freq: Every day | ORAL | Status: DC

## 2012-05-09 MED ORDER — CLOTRIMAZOLE-BETAMETHASONE 1-0.05 % EX CREA: TOPICAL_CREAM | Freq: Two times a day (BID) | CUTANEOUS | Status: DC

## 2012-05-09 MED ORDER — OLMESARTAN MEDOXOMIL-HCTZ 40-25 MG PO TABS: 1.0000 | ORAL_TABLET | Freq: Every day | ORAL | Status: DC

## 2012-05-09 NOTE — Assessment & Plan Note (Signed)
Continue with current prescription therapy as reflected on the Med list.  

## 2012-05-09 NOTE — Assessment & Plan Note (Signed)
Wt loss  

## 2012-05-09 NOTE — Assessment & Plan Note (Signed)
We discussed age appropriate health related issues, including available/recomended screening tests and vaccinations. We discussed a need for adhering to healthy diet and exercise. Labs/EKG were reviewed/ordered. All questions were answered.   

## 2012-05-09 NOTE — Progress Notes (Signed)
Subjective:    Patient ID: Joel Medina, male    DOB: 04/30/1961, 51 y.o.   MRN: [id number]  HPI  The patient is here for a wellness exam. The patient has been doing well overall without major physical or psychological issues going on lately.  The patient needs to address  chronic hypertension that has been well controlled with medicines; to address chronic  hyperlipidemia controlled with medicines as well; and to address type 2 pre diabetes, ED, obesity  He lost wt - new job, eating less  Review of Systems  Constitutional: Negative for appetite change, fatigue and unexpected weight change.  HENT: Negative for nosebleeds, congestion, sore throat, sneezing, trouble swallowing and neck pain.   Eyes: Negative for itching and visual disturbance.  Respiratory: Negative for cough.   Cardiovascular: Negative for chest pain, palpitations and leg swelling.  Gastrointestinal: Negative for nausea, diarrhea, blood in stool and abdominal distention.  Genitourinary: Negative for frequency and hematuria.  Musculoskeletal: Negative for back pain, joint swelling and gait problem.  Skin: Negative for rash.  Neurological: Negative for dizziness, tremors, speech difficulty and weakness.  Psychiatric/Behavioral: Negative for sleep disturbance, dysphoric mood and agitation. The patient is not nervous/anxious.   B shins with no edema  BP Readings from Last 3 Encounters:  05/09/12 100/70  11/02/11 122/80  05/04/11 140/72   Wt Readings from Last 3 Encounters:  05/09/12 339 lb (153.769 kg)  11/02/11 341 lb (154.677 kg)  05/04/11 358 lb (162.388 kg)        Objective:   Physical Exam  Constitutional: He is oriented to person, place, and time. He appears well-developed.       Obese  HENT:  Mouth/Throat: Oropharynx is clear and moist.  Eyes: Conjunctivae normal are normal. Pupils are equal, round, and reactive to light.  Neck: Normal range of motion. No JVD present. No thyromegaly present.    Cardiovascular: Normal rate, regular rhythm, normal heart sounds and intact distal pulses.  Exam reveals no gallop and no friction rub.   No murmur heard. Pulmonary/Chest: Effort normal and breath sounds normal. No respiratory distress. He has no wheezes. He has no rales. He exhibits no tenderness.  Abdominal: Soft. Bowel sounds are normal. He exhibits no distension and no mass. There is no tenderness. There is no rebound and no guarding.  Musculoskeletal: Normal range of motion. He exhibits no edema and no tenderness.  Lymphadenopathy:    He has no cervical adenopathy.  Neurological: He is alert and oriented to person, place, and time. He has normal reflexes. No cranial nerve deficit. He exhibits normal muscle tone. Coordination normal.  Skin: Skin is warm and dry. No rash noted.  Psychiatric: He has a normal mood and affect. His behavior is normal. Judgment and thought content normal.     Lab Results  Component Value Date   WBC 7.9 05/02/2012   HGB 15.8 05/02/2012   HCT 45.7 05/02/2012   PLT 216.0 05/02/2012   GLUCOSE 145* 05/02/2012   CHOL 175 05/02/2012   TRIG 235.0* 05/02/2012   HDL 32.30* 05/02/2012   LDLDIRECT 103.2 05/02/2012   LDLCALC 85 03/02/2010   ALT 36 05/02/2012   AST 32 05/02/2012   NA 136 05/02/2012   K 3.8 05/02/2012   CL 103 05/02/2012   CREATININE 0.9 05/02/2012   BUN 14 05/02/2012   CO2 26 05/02/2012   TSH 2.23 05/02/2012   PSA 0.76 05/02/2012        Assessment & Plan:  Check CBG at home

## 2012-05-09 NOTE — Assessment & Plan Note (Signed)
cont w/wt loss

## 2012-05-09 NOTE — Assessment & Plan Note (Signed)
Wt loss A1c 

## 2012-05-11 DIAGNOSIS — L219 Seborrheic dermatitis, unspecified: Secondary | ICD-10-CM | POA: Insufficient documentation

## 2012-05-11 NOTE — Assessment & Plan Note (Signed)
11/13 eyebrows Lotrisone bid

## 2012-05-11 NOTE — Assessment & Plan Note (Signed)
Continue with current prescription therapy as reflected on the Med list.  

## 2012-11-07 ENCOUNTER — Ambulatory Visit: Payer: 59 | Admitting: Internal Medicine

## 2012-11-14 ENCOUNTER — Ambulatory Visit (INDEPENDENT_AMBULATORY_CARE_PROVIDER_SITE_OTHER): Payer: 59 | Admitting: Internal Medicine

## 2012-11-14 ENCOUNTER — Encounter: Payer: Self-pay | Admitting: Internal Medicine

## 2012-11-14 VITALS — BP 122/90 | HR 76 | Temp 97.9°F | Resp 16 | Wt 337.0 lb

## 2012-11-14 DIAGNOSIS — G5620 Lesion of ulnar nerve, unspecified upper limb: Secondary | ICD-10-CM | POA: Insufficient documentation

## 2012-11-14 DIAGNOSIS — M25559 Pain in unspecified hip: Secondary | ICD-10-CM

## 2012-11-14 DIAGNOSIS — M25552 Pain in left hip: Secondary | ICD-10-CM

## 2012-11-14 DIAGNOSIS — I10 Essential (primary) hypertension: Secondary | ICD-10-CM

## 2012-11-14 DIAGNOSIS — R739 Hyperglycemia, unspecified: Secondary | ICD-10-CM

## 2012-11-14 DIAGNOSIS — G5622 Lesion of ulnar nerve, left upper limb: Secondary | ICD-10-CM

## 2012-11-14 DIAGNOSIS — R7309 Other abnormal glucose: Secondary | ICD-10-CM

## 2012-11-14 DIAGNOSIS — E669 Obesity, unspecified: Secondary | ICD-10-CM

## 2012-11-14 MED ORDER — AMLODIPINE BESYLATE 10 MG PO TABS: 10.0000 mg | ORAL_TABLET | Freq: Every day | ORAL | Status: DC

## 2012-11-14 MED ORDER — OLMESARTAN MEDOXOMIL-HCTZ 40-25 MG PO TABS: 1.0000 | ORAL_TABLET | Freq: Every day | ORAL | Status: DC

## 2012-11-14 NOTE — Assessment & Plan Note (Addendum)
S/p injection - helped x 2 wks F/u w/ortho to discuss THR

## 2012-11-14 NOTE — Assessment & Plan Note (Signed)
Better Wt Readings from Last 3 Encounters:  11/14/12 337 lb (152.862 kg)  05/09/12 339 lb (153.769 kg)  11/02/11 341 lb (154.677 kg)

## 2012-11-14 NOTE — Assessment & Plan Note (Signed)
He will see his Ortho doctor ASAP

## 2012-11-14 NOTE — Assessment & Plan Note (Signed)
Cont w/wt los A1c

## 2012-11-14 NOTE — Assessment & Plan Note (Signed)
Continue with current prescription therapy as reflected on the Med list. Labs  

## 2012-11-14 NOTE — Progress Notes (Signed)
   Subjective:    HPI    The patient needs to address  chronic hypertension that has been well controlled with medicines; to address chronic  hyperlipidemia controlled with medicines as well; and to address type 2 pre diabetes, ED, obesity C/o L elbow pain and fingers #4-5 numbness - new He lost wt -  eating less  Review of Systems  Constitutional: Negative for appetite change, fatigue and unexpected weight change.  HENT: Negative for nosebleeds, congestion, sore throat, sneezing, trouble swallowing and neck pain.   Eyes: Negative for itching and visual disturbance.  Respiratory: Negative for cough.   Cardiovascular: Negative for chest pain, palpitations and leg swelling.  Gastrointestinal: Negative for nausea, diarrhea, blood in stool and abdominal distention.  Genitourinary: Negative for frequency and hematuria.  Musculoskeletal: Negative for back pain, joint swelling and gait problem.  Skin: Negative for rash.  Neurological: Negative for dizziness, tremors, speech difficulty and weakness.  Psychiatric/Behavioral: Negative for sleep disturbance, dysphoric mood and agitation. The patient is not nervous/anxious.   B shins with no edema  BP Readings from Last 3 Encounters:  11/14/12 122/90  05/09/12 100/70  11/02/11 122/80   Wt Readings from Last 3 Encounters:  11/14/12 337 lb (152.862 kg)  05/09/12 339 lb (153.769 kg)  11/02/11 341 lb (154.677 kg)        Objective:   Physical Exam  Constitutional: He is oriented to person, place, and time. He appears well-developed.  Obese  HENT:  Mouth/Throat: Oropharynx is clear and moist.  Eyes: Conjunctivae are normal. Pupils are equal, round, and reactive to light.  Neck: Normal range of motion. No JVD present. No thyromegaly present.  Cardiovascular: Normal rate, regular rhythm, normal heart sounds and intact distal pulses.  Exam reveals no gallop and no friction rub.   No murmur heard. Pulmonary/Chest: Effort normal and breath  sounds normal. No respiratory distress. He has no wheezes. He has no rales. He exhibits no tenderness.  Abdominal: Soft. Bowel sounds are normal. He exhibits no distension and no mass. There is no tenderness. There is no rebound and no guarding.  Musculoskeletal: Normal range of motion. He exhibits no edema and no tenderness.  Lymphadenopathy:    He has no cervical adenopathy.  Neurological: He is alert and oriented to person, place, and time. He has normal reflexes. No cranial nerve deficit. He exhibits normal muscle tone. Coordination normal.  Skin: Skin is warm and dry. No rash noted.  Psychiatric: He has a normal mood and affect. His behavior is normal. Judgment and thought content normal.     Lab Results  Component Value Date   WBC 7.9 05/02/2012   HGB 15.8 05/02/2012   HCT 45.7 05/02/2012   PLT 216.0 05/02/2012   GLUCOSE 145* 05/02/2012   CHOL 175 05/02/2012   TRIG 235.0* 05/02/2012   HDL 32.30* 05/02/2012   LDLDIRECT 103.2 05/02/2012   LDLCALC 85 03/02/2010   ALT 36 05/02/2012   AST 32 05/02/2012   NA 136 05/02/2012   K 3.8 05/02/2012   CL 103 05/02/2012   CREATININE 0.9 05/02/2012   BUN 14 05/02/2012   CO2 26 05/02/2012   TSH 2.23 05/02/2012   PSA 0.76 05/02/2012        Assessment & Plan:    Check CBG at home

## 2012-11-21 ENCOUNTER — Telehealth: Payer: Self-pay | Admitting: Internal Medicine

## 2012-11-21 MED ORDER — TRAMADOL HCL 50 MG PO TABS: 50.0000 mg | ORAL_TABLET | Freq: Two times a day (BID) | ORAL | Status: DC | PRN

## 2012-11-21 NOTE — Telephone Encounter (Signed)
Pt is requesting something to be called in for hip pain.  He uses [facility] [address] at [employer].  He wants hydrocodone or something like that.

## 2012-11-21 NOTE — Telephone Encounter (Signed)
Ok Tramadol - called in Thx

## 2012-11-24 NOTE — Telephone Encounter (Signed)
Pt is aware.  

## 2012-12-14 ENCOUNTER — Encounter (HOSPITAL_COMMUNITY): Payer: Self-pay | Admitting: *Deleted

## 2012-12-14 ENCOUNTER — Emergency Department (HOSPITAL_COMMUNITY)
Admission: EM | Admit: 2012-12-14 | Discharge: 2012-12-14 | Disposition: A | Discharge status: Home / Self Care | Payer: 59 | Attending: Emergency Medicine | Admitting: Emergency Medicine

## 2012-12-14 DIAGNOSIS — G8929 Other chronic pain: Secondary | ICD-10-CM | POA: Insufficient documentation

## 2012-12-14 DIAGNOSIS — Z87448 Personal history of other diseases of urinary system: Secondary | ICD-10-CM | POA: Insufficient documentation

## 2012-12-14 DIAGNOSIS — M25552 Pain in left hip: Secondary | ICD-10-CM

## 2012-12-14 DIAGNOSIS — M25559 Pain in unspecified hip: Secondary | ICD-10-CM | POA: Insufficient documentation

## 2012-12-14 DIAGNOSIS — E669 Obesity, unspecified: Secondary | ICD-10-CM | POA: Insufficient documentation

## 2012-12-14 DIAGNOSIS — Z79899 Other long term (current) drug therapy: Secondary | ICD-10-CM | POA: Insufficient documentation

## 2012-12-14 DIAGNOSIS — I10 Essential (primary) hypertension: Secondary | ICD-10-CM | POA: Insufficient documentation

## 2012-12-14 DIAGNOSIS — R269 Unspecified abnormalities of gait and mobility: Secondary | ICD-10-CM | POA: Insufficient documentation

## 2012-12-14 MED ORDER — ONDANSETRON 4 MG PO TBDP
4.0000 mg | ORAL_TABLET | Freq: Once | ORAL | Status: AC
  Administered 2012-12-14: 4 mg via ORAL
  Filled 2012-12-14: qty 1

## 2012-12-14 MED ORDER — HYDROMORPHONE HCL PF 2 MG/ML IJ SOLN
2.0000 mg | Freq: Once | INTRAMUSCULAR | Status: AC
  Administered 2012-12-14: 2 mg via INTRAMUSCULAR
  Filled 2012-12-14: qty 1

## 2012-12-14 MED ORDER — HYDROCODONE-ACETAMINOPHEN 5-325 MG PO TABS: 2.0000 | ORAL_TABLET | Freq: Four times a day (QID) | ORAL | Status: DC | PRN

## 2012-12-14 NOTE — ED Provider Notes (Signed)
   History    CSN: [id number] Arrival date & time 12/14/12  2058  First MD Initiated Contact with Patient 12/14/12 2248     Chief Complaint  Patient presents with  . Hip Pain   (Consider location/radiation/quality/duration/timing/severity/associated sxs/prior Treatment) HPI Comments: Patient with chronic L hip pain that is getting worse Has had steroid injection without relief, ultram prescribed through his PCP was seen by Warner/murphy but is unhappy with the assessment and treatment plan .   Patient is a 52 y.o. male presenting with hip pain. The history is provided by the patient.  Hip Pain This is a chronic problem. The current episode started 1 to 4 weeks ago. The problem occurs constantly. The problem has been gradually worsening. Pertinent negatives include no fever or numbness. The symptoms are aggravated by walking, standing and exertion. Treatments tried: ultram. The treatment provided no relief.   Past Medical History  Diagnosis Date  . HTN (hypertension)   . Obesity   . History of pyelonephritis    History reviewed. No pertinent past surgical history. Family History  Problem Relation Age of Onset  . Hypertension Other   . Hypertension Father    History  Substance Use Topics  . Smoking status: Never Smoker   . Smokeless tobacco: Not on file  . Alcohol Use: No    Review of Systems  Constitutional: Negative for fever.  Musculoskeletal: Positive for gait problem.  Neurological: Negative for numbness.  All other systems reviewed and are negative.    Allergies  Lisinopril  Home Medications   Current Outpatient Rx  Name  Route  Sig  Dispense  Refill  . amLODipine (NORVASC) 10 MG tablet   Oral   Take 10 mg by mouth daily.         Marland Kitchen olmesartan-hydrochlorothiazide (BENICAR HCT) 40-25 MG per tablet   Oral   Take 1 tablet by mouth daily.         . traMADol (ULTRAM) 50 MG tablet   Oral   Take 50 mg by mouth every 6 (six) hours as needed for pain.        Marland Kitchen HYDROcodone-acetaminophen (NORCO/VICODIN) 5-325 MG per tablet   Oral   Take 2 tablets by mouth every 6 (six) hours as needed for pain.   30 tablet   0    BP 130/77  Pulse 89  Temp(Src) 98.7 F (37.1 C) (Oral)  Resp 20  SpO2 96% Physical Exam  Nursing note and vitals reviewed. Constitutional: He is oriented to person, place, and time. He appears well-developed and well-nourished.  HENT:  Head: Normocephalic.  Eyes: Pupils are equal, round, and reactive to light.  Neck: Normal range of motion.  Cardiovascular: Normal rate and regular rhythm.   Pulmonary/Chest: Effort normal and breath sounds normal.  Musculoskeletal: He exhibits no tenderness.       Left hip: He exhibits decreased range of motion and bony tenderness. He exhibits no swelling and no deformity.       Legs: Neurological: He is alert and oriented to person, place, and time.  Skin: Skin is warm.    ED Course  Procedures (including critical care time) Labs Reviewed - No data to display No results found. 1. Hip pain, chronic, left     MDM  Will give IM Dilaudid Rx for Vicodin that he can use at night, than use the Ultram during the day Will refer to [facility] ortho  Arman Filter, NP 12/16/12 2000

## 2012-12-14 NOTE — ED Notes (Signed)
The pt is c/o  Lt hip pain for a long time.  He has chronic hip pain and he needs a hip replacement.  He saw his doctor that gave him a rx that is not working

## 2012-12-18 NOTE — ED Provider Notes (Signed)
Medical screening examination/treatment/procedure(s) were performed by non-physician practitioner and as supervising physician I was immediately available for consultation/collaboration.  Tyreke Kaeser, MD 12/18/12 0041 

## 2013-01-08 ENCOUNTER — Telehealth: Payer: Self-pay | Admitting: *Deleted

## 2013-01-08 NOTE — Telephone Encounter (Signed)
Pt is having left hip replacement surgery and needs clearance. Does he need OV?

## 2013-01-08 NOTE — Telephone Encounter (Signed)
Yes. Thx.

## 2013-01-08 NOTE — Telephone Encounter (Signed)
Will have schedulers contact pt to sched OV.

## 2013-01-13 ENCOUNTER — Encounter: Payer: Self-pay | Admitting: Internal Medicine

## 2013-01-13 ENCOUNTER — Ambulatory Visit (INDEPENDENT_AMBULATORY_CARE_PROVIDER_SITE_OTHER): Payer: 59 | Admitting: Internal Medicine

## 2013-01-13 VITALS — BP 120/80 | HR 80 | Temp 98.6°F | Resp 16 | Wt 329.0 lb

## 2013-01-13 DIAGNOSIS — M25559 Pain in unspecified hip: Secondary | ICD-10-CM

## 2013-01-13 DIAGNOSIS — R7309 Other abnormal glucose: Secondary | ICD-10-CM

## 2013-01-13 DIAGNOSIS — E669 Obesity, unspecified: Secondary | ICD-10-CM

## 2013-01-13 DIAGNOSIS — Z01818 Encounter for other preprocedural examination: Secondary | ICD-10-CM | POA: Insufficient documentation

## 2013-01-13 DIAGNOSIS — E785 Hyperlipidemia, unspecified: Secondary | ICD-10-CM

## 2013-01-13 DIAGNOSIS — I10 Essential (primary) hypertension: Secondary | ICD-10-CM

## 2013-01-13 DIAGNOSIS — M25552 Pain in left hip: Secondary | ICD-10-CM

## 2013-01-13 DIAGNOSIS — R739 Hyperglycemia, unspecified: Secondary | ICD-10-CM

## 2013-01-13 MED ORDER — MELOXICAM 15 MG PO TABS: 15.0000 mg | ORAL_TABLET | Freq: Every day | ORAL | Status: DC | PRN

## 2013-01-13 MED ORDER — POLYETHYLENE GLYCOL 3350 17 GM/SCOOP PO POWD: 17.0000 g | Freq: Every day | ORAL | Status: DC | PRN

## 2013-01-13 NOTE — Assessment & Plan Note (Addendum)
Doing well after wt loss 

## 2013-01-13 NOTE — Progress Notes (Signed)
Subjective:   IM Consult Req by Dr Lequita Halt Reason: pre-op exam  HPI  The patient is here for a pre-op exam. The patient has been doing well overall without major physical or psychological issues going on lately, except for severe hip pain  The patient has chronic hypertension that has been well controlled with medicines after wt loss; to address chronic  hyperlipidemia controlled with medicines as well; and to address type 2 pre diabetes, ED, obesity - all stable  He lost wt -  eating less  Past Medical History  Diagnosis Date  . HTN (hypertension)   . Obesity   . History of pyelonephritis    History reviewed. No pertinent past surgical history.  reports that he has never smoked. He does not have any smokeless tobacco history on file. He reports that he does not drink alcohol or use illicit drugs. family history includes Hypertension in his father and other. Allergies  Allergen Reactions  . Lisinopril     REACTION: Side effects   Current Outpatient Prescriptions on File Prior to Visit  Medication Sig Dispense Refill  . amLODipine (NORVASC) 10 MG tablet Take 10 mg by mouth daily.      Marland Kitchen HYDROcodone-acetaminophen (NORCO/VICODIN) 5-325 MG per tablet Take 2 tablets by mouth every 6 (six) hours as needed for pain.  30 tablet  0  . olmesartan-hydrochlorothiazide (BENICAR HCT) 40-25 MG per tablet Take 1 tablet by mouth daily.      . traMADol (ULTRAM) 50 MG tablet Take 50 mg by mouth every 6 (six) hours as needed for pain.       No current facility-administered medications on file prior to visit.     Review of Systems  Constitutional: Positive for unexpected weight change. Negative for appetite change and fatigue.  HENT: Negative for nosebleeds, congestion, sore throat, sneezing, trouble swallowing and neck pain.   Eyes: Negative for itching and visual disturbance.  Respiratory: Negative for cough.   Cardiovascular: Negative for chest pain, palpitations and leg swelling.   Gastrointestinal: Negative for nausea, diarrhea, blood in stool and abdominal distention.  Genitourinary: Negative for frequency and hematuria.  Musculoskeletal: Positive for arthralgias and gait problem. Negative for back pain and joint swelling.  Skin: Negative for rash.  Neurological: Negative for dizziness, tremors, speech difficulty and weakness.  Psychiatric/Behavioral: Negative for sleep disturbance, dysphoric mood and agitation. The patient is not nervous/anxious.   B shins with no edema  BP Readings from Last 3 Encounters:  01/13/13 120/80  12/14/12 130/77  11/14/12 122/90   Wt Readings from Last 3 Encounters:  01/13/13 329 lb (149.233 kg)  11/14/12 337 lb (152.862 kg)  05/09/12 339 lb (153.769 kg)   BP 120/80  Pulse 80  Temp(Src) 98.6 F (37 C) (Oral)  Resp 16  Wt 329 lb (149.233 kg)  BMI 41.12 kg/m2      Objective:   Physical Exam  Constitutional: He is oriented to person, place, and time. He appears well-developed.  Obese  HENT:  Mouth/Throat: Oropharynx is clear and moist.  Eyes: Conjunctivae are normal. Pupils are equal, round, and reactive to light.  Neck: Normal range of motion. No JVD present. No thyromegaly present.  Cardiovascular: Normal rate, regular rhythm, normal heart sounds and intact distal pulses.  Exam reveals no gallop and no friction rub.   No murmur heard. Pulmonary/Chest: Effort normal and breath sounds normal. No respiratory distress. He has no wheezes. He has no rales. He exhibits no tenderness.  Abdominal: Soft. Bowel sounds are  normal. He exhibits no distension and no mass. There is no tenderness. There is no rebound and no guarding.  Musculoskeletal: Normal range of motion. He exhibits tenderness. He exhibits no edema.  Limp, using a crutch  Lymphadenopathy:    He has no cervical adenopathy.  Neurological: He is alert and oriented to person, place, and time. He has normal reflexes. No cranial nerve deficit. He exhibits normal muscle  tone. Coordination normal.  Skin: Skin is warm and dry. No rash noted.  Psychiatric: He has a normal mood and affect. His behavior is normal. Judgment and thought content normal.     Lab Results  Component Value Date   WBC 7.9 05/02/2012   HGB 15.8 05/02/2012   HCT 45.7 05/02/2012   PLT 216.0 05/02/2012   GLUCOSE 145* 05/02/2012   CHOL 175 05/02/2012   TRIG 235.0* 05/02/2012   HDL 32.30* 05/02/2012   LDLDIRECT 103.2 05/02/2012   LDLCALC 85 03/02/2010   ALT 36 05/02/2012   AST 32 05/02/2012   NA 136 05/02/2012   K 3.8 05/02/2012   CL 103 05/02/2012   CREATININE 0.9 05/02/2012   BUN 14 05/02/2012   CO2 26 05/02/2012   TSH 2.23 05/02/2012   PSA 0.76 05/02/2012        Assessment & Plan:

## 2013-01-13 NOTE — Assessment & Plan Note (Addendum)
  7/14 Joel Medina is medically clear for surgery. Thank you!

## 2013-01-15 NOTE — Assessment & Plan Note (Signed)
Lost wt on diet - better

## 2013-01-15 NOTE — Assessment & Plan Note (Signed)
Severe OA L THR is planned

## 2013-01-15 NOTE — Assessment & Plan Note (Signed)
Should get better w/wt loss

## 2013-01-15 NOTE — Assessment & Plan Note (Signed)
  On diet  

## 2013-01-19 NOTE — Progress Notes (Signed)
Need orders in EPIC.  Surgery scheduled for 02/04/13.  Thank You 

## 2013-01-22 ENCOUNTER — Encounter (HOSPITAL_COMMUNITY): Payer: Self-pay | Admitting: Pharmacy Technician

## 2013-01-24 ENCOUNTER — Other Ambulatory Visit: Payer: Self-pay | Admitting: Orthopedic Surgery

## 2013-01-24 NOTE — Progress Notes (Signed)
Preoperative surgical orders have been place into the Epic hospital system for Joel Medina on 01/24/2013, 9:46 AM  by Patrica Duel for surgery on 02/04/13.  Preop Total Hip orders including Experel Injecion, IV Tylenol, and PO Decadron as long as there are no contraindications to the above medications. Avel Peace, PA-C

## 2013-01-27 ENCOUNTER — Other Ambulatory Visit: Payer: Self-pay | Admitting: Orthopedic Surgery

## 2013-01-28 ENCOUNTER — Ambulatory Visit (HOSPITAL_COMMUNITY)
Admission: RE | Admit: 2013-01-28 | Discharge: 2013-01-28 | Disposition: A | Discharge status: Home / Self Care | Payer: 59 | Source: Ambulatory Visit | Attending: Orthopedic Surgery | Admitting: Orthopedic Surgery

## 2013-01-28 ENCOUNTER — Other Ambulatory Visit: Payer: Self-pay | Admitting: Orthopedic Surgery

## 2013-01-28 ENCOUNTER — Encounter (HOSPITAL_COMMUNITY): Payer: Self-pay

## 2013-01-28 ENCOUNTER — Encounter (HOSPITAL_COMMUNITY)
Admission: RE | Admit: 2013-01-28 | Discharge: 2013-01-28 | Disposition: A | Discharge status: Home / Self Care | Payer: 59 | Source: Ambulatory Visit | Attending: Orthopedic Surgery | Admitting: Orthopedic Surgery

## 2013-01-28 DIAGNOSIS — Z01818 Encounter for other preprocedural examination: Secondary | ICD-10-CM | POA: Insufficient documentation

## 2013-01-28 DIAGNOSIS — Z01810 Encounter for preprocedural cardiovascular examination: Secondary | ICD-10-CM | POA: Insufficient documentation

## 2013-01-28 DIAGNOSIS — Z01812 Encounter for preprocedural laboratory examination: Secondary | ICD-10-CM | POA: Insufficient documentation

## 2013-01-28 DIAGNOSIS — M169 Osteoarthritis of hip, unspecified: Secondary | ICD-10-CM | POA: Insufficient documentation

## 2013-01-28 DIAGNOSIS — M1610 Unilateral primary osteoarthritis, unspecified hip: Secondary | ICD-10-CM | POA: Insufficient documentation

## 2013-01-28 HISTORY — DX: Unspecified osteoarthritis, unspecified site: M19.90

## 2013-01-28 LAB — COMPREHENSIVE METABOLIC PANEL
ALT: 18 U/L (ref 0–53)
AST: 17 U/L (ref 0–37)
Alkaline Phosphatase: 70 U/L (ref 39–117)
CO2: 25 mEq/L (ref 19–32)
GFR calc Af Amer: >90 mL/min (ref >90)
Glucose, Bld: 144 mg/dL — ABNORMAL HIGH (ref 70–99)
Potassium: 3.6 mEq/L (ref 3.5–5.1)
Sodium: 136 mEq/L (ref 135–145)
Total Protein: 7.1 g/dL (ref 6.0–8.3)

## 2013-01-28 LAB — URINALYSIS, ROUTINE W REFLEX MICROSCOPIC
Glucose, UA: NEGATIVE mg/dL
Leukocytes, UA: NEGATIVE
Protein, ur: NEGATIVE mg/dL
Specific Gravity, Urine: 1.019 (ref 1.005–1.030)

## 2013-01-28 LAB — SURGICAL PCR SCREEN: Staphylococcus aureus: NEGATIVE

## 2013-01-28 LAB — ABO/RH: ABO/RH(D): A POS

## 2013-01-28 LAB — CBC
Hemoglobin: 15.3 g/dL (ref 13.0–17.0)
MCHC: 35.8 g/dL (ref 30.0–36.0)
RBC: 4.72 MIL/uL (ref 4.22–5.81)

## 2013-01-28 LAB — APTT: aPTT: 55 seconds — ABNORMAL HIGH (ref 24–37)

## 2013-01-28 NOTE — Pre-Procedure Instructions (Addendum)
MEDICAL CLEARANCE FOR HIP REPLACEMENT ON PT'S CHART FROM DR. PLOTNIKOV. EKG, CXR, LEFT HIP XRAY WERE DONE TODAY AT [facility] AS PER ANESTHESIOLOGIST'S GUIDELINES AND ORDERS DR. Lequita Halt. PT'S PTT ELEVATES AT 55, PT, INR NORMAL - BOTH PTT AND PT REPORTS FAXED TO DR. ALUISIO'S OFFICE.

## 2013-01-28 NOTE — H&P (Signed)
Joel Medina  DOB: 08/01/1960 Married / Language: [name] / Race: White Male  Date of Admission:  02/04/2013  Chief Complaint:  Left Hip Pain  History of Present Illness The patient is a 52 year old male who comes in for a preoperative History and Physical. The patient is scheduled for a left total hip arthroplasty to be performed by Dr. Gus Rankin. Aluisio, MD at [facility] on 02/04/2013. The patient is a 52 year old male who presents with a hip problem. The patient is seen for a second opinion.The patient reports left hip problems including pain and stiffness symptoms that have been present for 2 year(s) (increased for the past year). The symptoms began without any known injury. The patient reports symptoms radiating to the: left groin.The patient feels as if their symptoms are does feel they are worsening. Current treatment includes opioid analgesics (hydrocodone). Prior to being seen today the patient was previously evaluated by a colleague Delbert Harness as well as Museum/gallery conservator) 5 month(s) ago. Previous workup for this problem has included hip x-rays. Previous treatment for this problem has included corticosteroid injection (intra-articular injection on February 19th. He states the injection helped for a short time, but then the pain returned and he has not been able to get any further relief) and nonsteroidal anti-inflammatory drugs. All of his issues have been present for about a year. They have gotten much worse in the past 6 months. He had an intra-articular injection at Dr. Greig Right office earlier this year that only helped for a couple of weeks. After that the pain has come back intensely.  He has pain in his groin radiating to his knee, also in the lateral hip, radiating just below the knee, and in the buttock. Each of these pains has a distinct quality to it, but all of them are giving him a lot of trouble. He's had marked functional limitations because of  loss of motion. He works for [employer] in a sedentary position and has been able to keep working but it is getting increasingly difficult to do so.  He is ready to get the hip fixed. They have been treated conservatively in the past for the above stated problem and despite conservative measures, they continue to have progressive pain and severe functional limitations and dysfunction. They have failed non-operative management including home exercise, medications. It is felt that they would benefit from undergoing total joint replacement. Risks and benefits of the procedure have been discussed with the patient and they elect to proceed with surgery. There are no active contraindications to surgery such as ongoing infection or rapidly progressive neurological disease.  Problem List Osteoarthritis, Hip   Allergies No Known Drug Allergies   Family History Hypertension. mother, brother and grandfather mothers side Diabetes Mellitus. grandfather mothers side Heart Disease. grandfather mothers side   Social History Number of flights of stairs before winded. less than 1 Pain Contract. yes Most recent primary occupation. Event organiser Living situation. live with spouse Marital status. married Tobacco use. never smoker; smoke(d) less than 1/2 pack(s) per day; uses less than half 1/2 can(s) smokeless per week Previously in rehab. no Tobacco / smoke exposure. no Illicit drug use. no Alcohol use. current drinker; drinks beer; 8-14 per week Drug/Alcohol Rehab (Currently). no Exercise. Exercises rarely; does other Children. 4 Current work status. working full time   Medication History Hydrocodone-Acetaminophen (10-325MG  Tablet, Oral) Active. AmLODIPine Besylate (10MG  Tablet, Oral) Active. Benicar HCT (40-25MG  Tablet, Oral) Active.  Past Surgical History Bunionectomy Ulnar Surgery   Medical History High blood  pressure Obesity Hypercholesterolemia   Review of Systems General:Not Present- Chills, Fever, Night Sweats, Fatigue, Weight Gain, Weight Loss and Memory Loss. Skin:Not Present- Hives, Itching, Rash, Eczema and Lesions. HEENT:Not Present- Tinnitus, Headache, Double Vision, Visual Loss, Hearing Loss and Dentures. Respiratory:Not Present- Shortness of breath with exertion, Shortness of breath at rest, Allergies, Coughing up blood and Chronic Cough. Cardiovascular:Not Present- Chest Pain, Racing/skipping heartbeats, Difficulty Breathing Lying Down, Murmur, Swelling and Palpitations. Gastrointestinal:Not Present- Bloody Stool, Heartburn, Abdominal Pain, Vomiting, Nausea, Constipation, Diarrhea, Difficulty Swallowing, Jaundice and Loss of appetitie. Male Genitourinary:Not Present- Urinary frequency, Blood in Urine, Weak urinary stream, Discharge, Flank Pain, Incontinence, Painful Urination, Urgency, Urinary Retention and Urinating at Night. Musculoskeletal:Present- Muscle Pain, Joint Pain, Spasms and Muscle Cramps. Not Present- Muscle Weakness, Joint Swelling, Back Pain and Morning Stiffness. Neurological:Not Present- Tremor, Dizziness, Blackout spells, Paralysis, Difficulty with balance and Weakness. Psychiatric:Not Present- Insomnia.   Vitals Weight: 330 lb Height: 75 in Weight was reported by patient. Height was reported by patient. Body Surface Area: 2.81 m Body Mass Index: 41.25 kg/m Pulse: 84 (Regular) Resp.: 12 (Unlabored) BP: 122/72 (Sitting, Left Arm, Standard)    Physical Exam The physical exam findings are as follows:   General Mental Status - Alert, cooperative and good historian. General Appearance- pleasant. Not in acute distress. Orientation- Oriented X3. Build & Nutrition- Overweight, Obese and Well developed.   Head and Neck Head- normocephalic, atraumatic . Neck Global Assessment- supple. no bruit auscultated on the right and no  bruit auscultated on the left.   Eye Vision- Wears corrective lenses. Pupil- Bilateral- Regular and Round. Motion- Bilateral- EOMI.   Chest and Lung Exam Auscultation: Breath sounds:- clear at anterior chest wall and - clear at posterior chest wall. Adventitious sounds:- No Adventitious sounds.   Cardiovascular Auscultation:Rhythm- Regular rate and rhythm. Heart Sounds- S1 WNL and S2 WNL. Murmurs & Other Heart Sounds:Auscultation of the heart reveals - No Murmurs.   Abdomen Inspection:Contour- Generalized moderate distention. Palpation/Percussion:Tenderness- Abdomen is non-tender to palpation. Rigidity (guarding)- Abdomen is soft. Auscultation:Auscultation of the abdomen reveals - Bowel sounds normal.   Male Genitourinary Not done, not pertinent to present illness  Musculoskeletal On exam, well-developed male, alert and oriented, in no apparent distress. Evaluation of his right hip shows flexion to 110, rotation in 10, out 30, abduction 30 without discomfort. Left hip flexion 90. No internal or external rotation. Abduction about 20. Left knee exam is normal. Slight peripheral edema in both legs. Pulses are intact with intact motor and sensation.  Radiographs - brought over from February from [facility] office. He has severe advanced left hip arthritis, bone on bone, with some flattening of the femoral head and massive osteophytes. He also has arthritic change in the right hip but to a lesser degree.  Assessment & Plan Osteoarthritis, Hip (715.35) Impression: Left Hip  Note: Plan is for a Left Total Hip Replacement by Dr. Lequita Halt.  Plan is to go home.  PCP - Dr. Beaulah Dinning - Patient has been seen preoperatively and felt to be stable for surgery.  The patient does not have any contraindications and will recieve TXA (tranexamic acid) prior to surgery.  Time spent ~ 20 minutes  Signed electronically by Lauraine Rinne, III PA-C

## 2013-01-28 NOTE — Progress Notes (Signed)
01/28/13 1000  OBSTRUCTIVE SLEEP APNEA  Have you ever been diagnosed with sleep apnea through a sleep study? No  Do you snore loudly (loud enough to be heard through closed doors)?  0  Do you often feel tired, fatigued, or sleepy during the daytime? 1  Has anyone observed you stop breathing during your sleep? 1  Do you have, or are you being treated for high blood pressure? 1  BMI more than 35 kg/m2? 1  Age over 51 years old? 1  Neck circumference greater than 40 cm/18 inches? 1  Gender: 1  Obstructive Sleep Apnea Score 7  Score 4 or greater  Results sent to PCP

## 2013-01-28 NOTE — Patient Instructions (Addendum)
YOUR SURGERY IS SCHEDULED AT [facility]  ON:  Wednesday 8/20  REPORT TO [facility] AT:  6:30 AM      PHONE # FOR SHORT STAY IS [phone]  DO NOT EAT OR DRINK ANYTHING AFTER MIDNIGHT THE NIGHT BEFORE YOUR SURGERY.  YOU MAY BRUSH YOUR TEETH, RINSE OUT YOUR MOUTH--BUT NO WATER, NO FOOD, NO CHEWING GUM, NO MINTS, NO CANDIES, NO CHEWING TOBACCO.  PLEASE TAKE THE FOLLOWING MEDICATIONS THE AM OF YOUR SURGERY WITH A FEW SIPS OF WATER:  AMLODIPINE, HYDROCODONE / ACETAMINOPHEN    DO NOT BRING VALUABLES, MONEY, CREDIT CARDS.  DO NOT WEAR JEWELRY, MAKE-UP, NAIL POLISH AND NO METAL PINS OR CLIPS IN YOUR HAIR. CONTACT LENS, DENTURES / PARTIALS, GLASSES SHOULD NOT BE WORN TO SURGERY AND IN MOST CASES-HEARING AIDS WILL NEED TO BE REMOVED.  BRING YOUR GLASSES CASE, ANY EQUIPMENT NEEDED FOR YOUR CONTACT LENS. FOR PATIENTS ADMITTED TO THE HOSPITAL--CHECK OUT TIME THE DAY OF DISCHARGE IS 11:00 AM.  ALL INPATIENT ROOMS ARE PRIVATE - WITH BATHROOM, TELEPHONE, TELEVISION AND WIFI INTERNET.                               PLEASE READ OVER ANY  FACT SHEETS THAT YOU WERE GIVEN: MRSA INFORMATION, BLOOD TRANSFUSION INFORMATION, INCENTIVE SPIROMETER INFORMATION. FAILURE TO FOLLOW THESE INSTRUCTIONS MAY RESULT IN THE CANCELLATION OF YOUR SURGERY.   PATIENT SIGNATURE_________________________________

## 2013-02-04 ENCOUNTER — Inpatient Hospital Stay (HOSPITAL_COMMUNITY)
Admission: RE | Admit: 2013-02-04 | Discharge: 2013-02-05 | DRG: 470 | Disposition: A | Discharge status: Home Health | Payer: 59 | Source: Ambulatory Visit | Attending: Orthopedic Surgery | Admitting: Orthopedic Surgery

## 2013-02-04 ENCOUNTER — Inpatient Hospital Stay (HOSPITAL_COMMUNITY): Payer: 59

## 2013-02-04 ENCOUNTER — Encounter (HOSPITAL_COMMUNITY): Payer: Self-pay | Admitting: Anesthesiology

## 2013-02-04 ENCOUNTER — Encounter (HOSPITAL_COMMUNITY): Payer: Self-pay | Admitting: *Deleted

## 2013-02-04 ENCOUNTER — Encounter (HOSPITAL_COMMUNITY)
Admission: RE | Disposition: A | Discharge status: Home Health | Payer: Self-pay | Source: Ambulatory Visit | Attending: Orthopedic Surgery

## 2013-02-04 ENCOUNTER — Inpatient Hospital Stay (HOSPITAL_COMMUNITY): Payer: 59 | Admitting: Anesthesiology

## 2013-02-04 DIAGNOSIS — E669 Obesity, unspecified: Secondary | ICD-10-CM | POA: Diagnosis present

## 2013-02-04 DIAGNOSIS — Z6841 Body Mass Index (BMI) 40.0 and over, adult: Secondary | ICD-10-CM

## 2013-02-04 DIAGNOSIS — M1610 Unilateral primary osteoarthritis, unspecified hip: Principal | ICD-10-CM | POA: Diagnosis present

## 2013-02-04 DIAGNOSIS — I10 Essential (primary) hypertension: Secondary | ICD-10-CM | POA: Diagnosis present

## 2013-02-04 DIAGNOSIS — E7800 Pure hypercholesterolemia, unspecified: Secondary | ICD-10-CM | POA: Diagnosis present

## 2013-02-04 DIAGNOSIS — M169 Osteoarthritis of hip, unspecified: Principal | ICD-10-CM | POA: Diagnosis present

## 2013-02-04 HISTORY — PX: TOTAL HIP ARTHROPLASTY: SHX124

## 2013-02-04 LAB — TYPE AND SCREEN: Antibody Screen: NEGATIVE

## 2013-02-04 SURGERY — ARTHROPLASTY, HIP, TOTAL,POSTERIOR APPROACH
Anesthesia: Spinal | Site: Hip | Laterality: Left | Wound class: Clean

## 2013-02-04 MED ORDER — ONDANSETRON HCL 4 MG/2ML IJ SOLN
INTRAMUSCULAR | Status: DC | PRN
  Administered 2013-02-04: 4 mg via INTRAVENOUS

## 2013-02-04 MED ORDER — ONDANSETRON HCL 4 MG/2ML IJ SOLN: 4.0000 mg | Freq: Four times a day (QID) | INTRAMUSCULAR | Status: DC | PRN

## 2013-02-04 MED ORDER — DEXTROSE 5 % IV SOLN
3.0000 g | INTRAVENOUS | Status: AC
  Administered 2013-02-04: 3 g via INTRAVENOUS
  Filled 2013-02-04: qty 3000

## 2013-02-04 MED ORDER — IRBESARTAN 300 MG PO TABS
300.0000 mg | ORAL_TABLET | Freq: Every day | ORAL | Status: DC
  Administered 2013-02-05: 300 mg via ORAL
  Filled 2013-02-04 (×2): qty 1

## 2013-02-04 MED ORDER — SODIUM CHLORIDE 0.9 % IV SOLN: INTRAVENOUS | Status: DC

## 2013-02-04 MED ORDER — POLYETHYLENE GLYCOL 3350 17 G PO PACK: 17.0000 g | PACK | Freq: Every day | ORAL | Status: DC | PRN

## 2013-02-04 MED ORDER — METOCLOPRAMIDE HCL 5 MG PO TABS
5.0000 mg | ORAL_TABLET | Freq: Three times a day (TID) | ORAL | Status: DC | PRN
  Filled 2013-02-04: qty 2

## 2013-02-04 MED ORDER — MIDAZOLAM HCL 5 MG/5ML IJ SOLN
INTRAMUSCULAR | Status: DC | PRN
  Administered 2013-02-04: 2 mg via INTRAVENOUS

## 2013-02-04 MED ORDER — RIVAROXABAN 10 MG PO TABS
10.0000 mg | ORAL_TABLET | Freq: Every day | ORAL | Status: DC
  Administered 2013-02-05: 10 mg via ORAL
  Filled 2013-02-04 (×2): qty 1

## 2013-02-04 MED ORDER — DEXAMETHASONE SODIUM PHOSPHATE 10 MG/ML IJ SOLN
10.0000 mg | Freq: Every day | INTRAMUSCULAR | Status: AC
  Filled 2013-02-04: qty 1

## 2013-02-04 MED ORDER — BUPIVACAINE LIPOSOME 1.3 % IJ SUSP
20.0000 mL | Freq: Once | INTRAMUSCULAR | Status: DC
  Filled 2013-02-04: qty 20

## 2013-02-04 MED ORDER — DOCUSATE SODIUM 100 MG PO CAPS
100.0000 mg | ORAL_CAPSULE | Freq: Two times a day (BID) | ORAL | Status: DC
  Administered 2013-02-04 – 2013-02-05 (×2): 100 mg via ORAL

## 2013-02-04 MED ORDER — CELECOXIB 200 MG PO CAPS
200.0000 mg | ORAL_CAPSULE | Freq: Once | ORAL | Status: AC
  Administered 2013-02-04: 200 mg via ORAL
  Filled 2013-02-04: qty 1

## 2013-02-04 MED ORDER — BUPIVACAINE HCL (PF) 0.25 % IJ SOLN
INTRAMUSCULAR | Status: DC | PRN
  Administered 2013-02-04: 20 mL

## 2013-02-04 MED ORDER — CHLORHEXIDINE GLUCONATE 4 % EX LIQD: 60.0000 mL | Freq: Once | CUTANEOUS | Status: DC

## 2013-02-04 MED ORDER — HYDROMORPHONE HCL PF 1 MG/ML IJ SOLN: 0.2500 mg | INTRAMUSCULAR | Status: DC | PRN

## 2013-02-04 MED ORDER — DEXAMETHASONE SODIUM PHOSPHATE 10 MG/ML IJ SOLN
10.0000 mg | Freq: Once | INTRAMUSCULAR | Status: AC
  Administered 2013-02-04: 10 mg via INTRAVENOUS

## 2013-02-04 MED ORDER — CEFAZOLIN SODIUM-DEXTROSE 2-3 GM-% IV SOLR
2.0000 g | Freq: Four times a day (QID) | INTRAVENOUS | Status: AC
  Administered 2013-02-04 (×2): 2 g via INTRAVENOUS
  Filled 2013-02-04 (×2): qty 50

## 2013-02-04 MED ORDER — GABAPENTIN 300 MG PO CAPS
600.0000 mg | ORAL_CAPSULE | Freq: Once | ORAL | Status: AC
  Administered 2013-02-04: 600 mg via ORAL
  Filled 2013-02-04: qty 2

## 2013-02-04 MED ORDER — BISACODYL 10 MG RE SUPP: 10.0000 mg | Freq: Every day | RECTAL | Status: DC | PRN

## 2013-02-04 MED ORDER — MENTHOL 3 MG MT LOZG: 1.0000 | LOZENGE | OROMUCOSAL | Status: DC | PRN

## 2013-02-04 MED ORDER — STERILE WATER FOR IRRIGATION IR SOLN
Status: DC | PRN
  Administered 2013-02-04: 3000 mL

## 2013-02-04 MED ORDER — ACETAMINOPHEN 500 MG PO TABS
1000.0000 mg | ORAL_TABLET | Freq: Once | ORAL | Status: AC
  Administered 2013-02-04: 1000 mg via ORAL
  Filled 2013-02-04: qty 2

## 2013-02-04 MED ORDER — TRAMADOL HCL 50 MG PO TABS: 50.0000 mg | ORAL_TABLET | Freq: Four times a day (QID) | ORAL | Status: DC | PRN

## 2013-02-04 MED ORDER — PROPOFOL INFUSION 10 MG/ML OPTIME
INTRAVENOUS | Status: DC | PRN
  Administered 2013-02-04: 100 ug/kg/min via INTRAVENOUS
  Administered 2013-02-04: 60 ug/kg/min via INTRAVENOUS

## 2013-02-04 MED ORDER — DEXAMETHASONE 6 MG PO TABS
10.0000 mg | ORAL_TABLET | Freq: Every day | ORAL | Status: AC
  Administered 2013-02-05: 10 mg via ORAL
  Filled 2013-02-04: qty 1

## 2013-02-04 MED ORDER — OXYCODONE HCL 5 MG PO TABS
5.0000 mg | ORAL_TABLET | ORAL | Status: DC | PRN
  Administered 2013-02-04 (×2): 10 mg via ORAL
  Administered 2013-02-04 – 2013-02-05 (×2): 15 mg via ORAL
  Administered 2013-02-05 (×3): 20 mg via ORAL
  Filled 2013-02-04: qty 3
  Filled 2013-02-04 (×3): qty 4
  Filled 2013-02-04: qty 3
  Filled 2013-02-04 (×2): qty 2

## 2013-02-04 MED ORDER — KETOROLAC TROMETHAMINE 15 MG/ML IJ SOLN
15.0000 mg | Freq: Four times a day (QID) | INTRAMUSCULAR | Status: AC | PRN
  Administered 2013-02-04: 15 mg via INTRAVENOUS
  Filled 2013-02-04: qty 1

## 2013-02-04 MED ORDER — OLMESARTAN MEDOXOMIL-HCTZ 40-25 MG PO TABS: 1.0000 | ORAL_TABLET | Freq: Every morning | ORAL | Status: DC

## 2013-02-04 MED ORDER — MEPERIDINE HCL 50 MG/ML IJ SOLN: 6.2500 mg | INTRAMUSCULAR | Status: DC | PRN

## 2013-02-04 MED ORDER — TRANEXAMIC ACID 100 MG/ML IV SOLN
1000.0000 mg | INTRAVENOUS | Status: AC
  Administered 2013-02-04: 1000 mg via INTRAVENOUS
  Filled 2013-02-04: qty 10

## 2013-02-04 MED ORDER — ACETAMINOPHEN 500 MG PO TABS
1000.0000 mg | ORAL_TABLET | Freq: Four times a day (QID) | ORAL | Status: AC
  Administered 2013-02-04: 1000 mg via ORAL
  Filled 2013-02-04: qty 2

## 2013-02-04 MED ORDER — ACETAMINOPHEN 500 MG PO TABS: 1000.0000 mg | ORAL_TABLET | Freq: Once | ORAL | Status: DC

## 2013-02-04 MED ORDER — ONDANSETRON HCL 4 MG PO TABS: 4.0000 mg | ORAL_TABLET | Freq: Four times a day (QID) | ORAL | Status: DC | PRN

## 2013-02-04 MED ORDER — OXYCODONE HCL 5 MG PO TABS: 5.0000 mg | ORAL_TABLET | Freq: Once | ORAL | Status: DC | PRN

## 2013-02-04 MED ORDER — MORPHINE SULFATE 2 MG/ML IJ SOLN
1.0000 mg | INTRAMUSCULAR | Status: DC | PRN
  Administered 2013-02-04 – 2013-02-05 (×3): 2 mg via INTRAVENOUS
  Filled 2013-02-04 (×3): qty 1

## 2013-02-04 MED ORDER — METHOCARBAMOL 100 MG/ML IJ SOLN
500.0000 mg | Freq: Four times a day (QID) | INTRAMUSCULAR | Status: DC | PRN
  Filled 2013-02-04 (×2): qty 5

## 2013-02-04 MED ORDER — METHOCARBAMOL 500 MG PO TABS
500.0000 mg | ORAL_TABLET | Freq: Four times a day (QID) | ORAL | Status: DC | PRN
  Administered 2013-02-04 – 2013-02-05 (×5): 500 mg via ORAL
  Filled 2013-02-04 (×5): qty 1

## 2013-02-04 MED ORDER — HYDROCHLOROTHIAZIDE 25 MG PO TABS
25.0000 mg | ORAL_TABLET | Freq: Every day | ORAL | Status: DC
  Administered 2013-02-05: 25 mg via ORAL
  Filled 2013-02-04 (×2): qty 1

## 2013-02-04 MED ORDER — PHENOL 1.4 % MT LIQD: 1.0000 | OROMUCOSAL | Status: DC | PRN

## 2013-02-04 MED ORDER — 0.9 % SODIUM CHLORIDE (POUR BTL) OPTIME
TOPICAL | Status: DC | PRN
  Administered 2013-02-04: 1000 mL

## 2013-02-04 MED ORDER — DIPHENHYDRAMINE HCL 12.5 MG/5ML PO ELIX: 12.5000 mg | ORAL_SOLUTION | ORAL | Status: DC | PRN

## 2013-02-04 MED ORDER — AMLODIPINE BESYLATE 10 MG PO TABS
10.0000 mg | ORAL_TABLET | Freq: Every morning | ORAL | Status: DC
  Administered 2013-02-05: 10 mg via ORAL
  Filled 2013-02-04: qty 1

## 2013-02-04 MED ORDER — OXYCODONE HCL 5 MG/5ML PO SOLN
5.0000 mg | Freq: Once | ORAL | Status: DC | PRN
  Filled 2013-02-04: qty 5

## 2013-02-04 MED ORDER — PROMETHAZINE HCL 25 MG/ML IJ SOLN: 6.2500 mg | INTRAMUSCULAR | Status: DC | PRN

## 2013-02-04 MED ORDER — FLEET ENEMA 7-19 GM/118ML RE ENEM: 1.0000 | ENEMA | Freq: Once | RECTAL | Status: AC | PRN

## 2013-02-04 MED ORDER — LACTATED RINGERS IV SOLN
INTRAVENOUS | Status: DC
  Administered 2013-02-04: 1000 mL via INTRAVENOUS

## 2013-02-04 MED ORDER — BUPIVACAINE HCL (PF) 0.5 % IJ SOLN
INTRAMUSCULAR | Status: DC | PRN
  Administered 2013-02-04: 3 mL

## 2013-02-04 MED ORDER — SODIUM CHLORIDE 0.9 % IJ SOLN
INTRAMUSCULAR | Status: DC | PRN
  Administered 2013-02-04: 10:00:00

## 2013-02-04 MED ORDER — POTASSIUM CHLORIDE IN NACL 20-0.9 MEQ/L-% IV SOLN
INTRAVENOUS | Status: DC
  Administered 2013-02-04: 12:00:00 via INTRAVENOUS
  Filled 2013-02-04 (×4): qty 1000

## 2013-02-04 MED ORDER — FENTANYL CITRATE 0.05 MG/ML IJ SOLN
INTRAMUSCULAR | Status: DC | PRN
  Administered 2013-02-04 (×2): 50 ug via INTRAVENOUS

## 2013-02-04 MED ORDER — METOCLOPRAMIDE HCL 5 MG/ML IJ SOLN: 5.0000 mg | Freq: Three times a day (TID) | INTRAMUSCULAR | Status: DC | PRN

## 2013-02-04 SURGICAL SUPPLY — 52 items
BAG ZIPLOCK 12X15 (MISCELLANEOUS) ×2 IMPLANT
BIT DRILL 2.8X128 (BIT) ×2 IMPLANT
BLADE EXTENDED COATED 6.5IN (ELECTRODE) ×2 IMPLANT
BLADE SAW SAG 73X25 THK (BLADE) ×1
BLADE SAW SGTL 73X25 THK (BLADE) ×1 IMPLANT
CAPT HIP PF COP ×2 IMPLANT
CLOTH BEACON ORANGE TIMEOUT ST (SAFETY) ×2 IMPLANT
DECANTER SPIKE VIAL GLASS SM (MISCELLANEOUS) ×2 IMPLANT
DRAPE INCISE IOBAN 66X45 STRL (DRAPES) ×2 IMPLANT
DRAPE ORTHO SPLIT 77X108 STRL (DRAPES) ×2
DRAPE POUCH INSTRU U-SHP 10X18 (DRAPES) ×2 IMPLANT
DRAPE SURG ORHT 6 SPLT 77X108 (DRAPES) ×2 IMPLANT
DRAPE U-SHAPE 47X51 STRL (DRAPES) ×2 IMPLANT
DRSG ADAPTIC 3X8 NADH LF (GAUZE/BANDAGES/DRESSINGS) ×2 IMPLANT
DRSG MEPILEX BORDER 4X4 (GAUZE/BANDAGES/DRESSINGS) ×2 IMPLANT
DRSG MEPILEX BORDER 4X8 (GAUZE/BANDAGES/DRESSINGS) ×2 IMPLANT
DURAPREP 26ML APPLICATOR (WOUND CARE) ×2 IMPLANT
ELECT REM PT RETURN 9FT ADLT (ELECTROSURGICAL) ×2
ELECTRODE REM PT RTRN 9FT ADLT (ELECTROSURGICAL) ×1 IMPLANT
EVACUATOR 1/8 PVC DRAIN (DRAIN) ×2 IMPLANT
FACESHIELD LNG OPTICON STERILE (SAFETY) ×8 IMPLANT
GLOVE BIO SURGEON STRL SZ7.5 (GLOVE) ×2 IMPLANT
GLOVE BIO SURGEON STRL SZ8 (GLOVE) ×2 IMPLANT
GLOVE BIOGEL PI IND STRL 8 (GLOVE) ×3 IMPLANT
GLOVE BIOGEL PI INDICATOR 8 (GLOVE) ×3
GLOVE SURG SS PI 6.5 STRL IVOR (GLOVE) ×4 IMPLANT
GOWN STRL NON-REIN LRG LVL3 (GOWN DISPOSABLE) ×4 IMPLANT
GOWN STRL REIN XL XLG (GOWN DISPOSABLE) ×2 IMPLANT
IMMOBILIZER KNEE 20 (SOFTGOODS)
IMMOBILIZER KNEE 20 THIGH 36 (SOFTGOODS) IMPLANT
KIT BASIN OR (CUSTOM PROCEDURE TRAY) ×2 IMPLANT
MANIFOLD NEPTUNE II (INSTRUMENTS) ×2 IMPLANT
NDL SAFETY ECLIPSE 18X1.5 (NEEDLE) ×1 IMPLANT
NEEDLE HYPO 18GX1.5 SHARP (NEEDLE) ×1
NEEDLE HYPO 22GX1.5 SAFETY (NEEDLE) ×2 IMPLANT
NS IRRIG 1000ML POUR BTL (IV SOLUTION) ×2 IMPLANT
PACK TOTAL JOINT (CUSTOM PROCEDURE TRAY) ×2 IMPLANT
PASSER SUT SWANSON 36MM LOOP (INSTRUMENTS) ×2 IMPLANT
POSITIONER SURGICAL ARM (MISCELLANEOUS) ×2 IMPLANT
SPONGE GAUZE 4X4 12PLY (GAUZE/BANDAGES/DRESSINGS) ×2 IMPLANT
STRIP CLOSURE SKIN 1/2X4 (GAUZE/BANDAGES/DRESSINGS) ×4 IMPLANT
SUT ETHIBOND NAB CT1 #1 30IN (SUTURE) ×4 IMPLANT
SUT MNCRL AB 4-0 PS2 18 (SUTURE) ×2 IMPLANT
SUT VIC AB 2-0 CT1 27 (SUTURE) ×3
SUT VIC AB 2-0 CT1 TAPERPNT 27 (SUTURE) ×3 IMPLANT
SUT VLOC 180 0 24IN GS25 (SUTURE) ×4 IMPLANT
SYR 20CC LL (SYRINGE) ×2 IMPLANT
SYR 50ML LL SCALE MARK (SYRINGE) ×2 IMPLANT
TOWEL OR 17X26 10 PK STRL BLUE (TOWEL DISPOSABLE) ×4 IMPLANT
TOWEL OR NON WOVEN STRL DISP B (DISPOSABLE) ×2 IMPLANT
TRAY FOLEY CATH 14FRSI W/METER (CATHETERS) ×2 IMPLANT
WATER STERILE IRR 1500ML POUR (IV SOLUTION) ×2 IMPLANT

## 2013-02-04 NOTE — Progress Notes (Signed)
Utilization review completed.  

## 2013-02-04 NOTE — Anesthesia Postprocedure Evaluation (Signed)
Anesthesia Post Note  Patient: Joel Medina  Procedure(s) Performed: Procedure(s) (LRB): LEFT TOTAL HIP ARTHROPLASTY (Left)  Anesthesia type: Spinal  Patient location: PACU  Post pain: Pain level controlled  Post assessment: Post-op Vital signs reviewed  Last Vitals: BP 136/80  Pulse 72  Temp(Src) 36.9 C (Oral)  Resp 14  Ht 6\' 3"  (1.905 m)  Wt 329 lb (149.233 kg)  BMI 41.12 kg/m2  SpO2 98%  Post vital signs: Reviewed  Level of consciousness: sedated  Complications: No apparent anesthesia complications

## 2013-02-04 NOTE — Evaluation (Signed)
Physical Therapy Evaluation Patient Details Name: Joel Medina MRN: [id number] DOB: 10/08/1960 Today's Date: 02/04/2013 Time: 9147-8295 PT Time Calculation (min): 30 min  PT Assessment / Plan / Recommendation History of Present Illness     Clinical Impression  Pt s/p L THR presents with functional mobility limitations 2* decreased L LE strength/ROM, post op pain and posterior THP.  Pt should progress to d/c home with family assist and HHPT follow up.    PT Assessment  Patient needs continued PT services    Follow Up Recommendations  Home health PT    Does the patient have the potential to tolerate intense rehabilitation      Barriers to Discharge        Equipment Recommendations       Recommendations for Other Services OT consult   Frequency 7X/week    Precautions / Restrictions Precautions Precautions: Posterior Hip Precaution Comments: Sign hung in room Restrictions Weight Bearing Restrictions: No Other Position/Activity Restrictions: WBAT   Pertinent Vitals/Pain 6/10; premed, ice packs provided      Mobility  Bed Mobility Bed Mobility: Supine to Sit Supine to Sit: 1: +2 Total assist Supine to Sit: Patient Percentage: 80% Details for Bed Mobility Assistance: cues for sequence, adherence to THP and use of R LE to self assist Transfers Transfers: Sit to Stand;Stand to Sit Sit to Stand: 1: +2 Total assist Sit to Stand: Patient Percentage: 80% Stand to Sit: 1: +2 Total assist;To chair/3-in-1;With upper extremity assist;With armrests Stand to Sit: Patient Percentage: 80% Details for Transfer Assistance: cues for sequence, LE management and use of UEs to self assist Ambulation/Gait Ambulation/Gait Assistance: 1: +2 Total assist Ambulation/Gait: Patient Percentage: 80% Ambulation Distance (Feet): 100 Feet Assistive device: Rolling walker Ambulation/Gait Assistance Details: cues for sequence, position from RW, posture and ER on R Gait Pattern: Step-to  pattern;Step-through pattern;Decreased stance time - left Stairs: No    Exercises Total Joint Exercises Ankle Circles/Pumps: AROM;10 reps;Supine;Both Quad Sets: AROM;10 reps;Both;Supine Heel Slides: AAROM;15 reps;Supine;Left Hip ABduction/ADduction: AAROM;15 reps;Supine;Left   PT Diagnosis: Difficulty walking  PT Problem List: Decreased strength;Decreased range of motion;Decreased activity tolerance;Decreased mobility;Decreased knowledge of use of DME;Obesity;Pain;Decreased knowledge of precautions PT Treatment Interventions: DME instruction;Gait training;Stair training;Functional mobility training;Therapeutic activities;Therapeutic exercise;Patient/family education     PT Goals(Current goals can be found in the care plan section) Acute Rehab PT Goals Patient Stated Goal: Walk without pain PT Goal Formulation: With patient Time For Goal Achievement: 02/07/13 Potential to Achieve Goals: Good  Visit Information  Last PT Received On: 02/04/13 Assistance Needed: +2       Prior Functioning  Home Living Family/patient expects to be discharged to:: Private residence Living Arrangements: Spouse/significant other Available Help at Discharge: Family Type of Home: House Home Access: Stairs to enter Secretary/administrator of Steps: 4 Entrance Stairs-Rails: Right;Left Home Layout: Able to live on main level with bedroom/bathroom Home Equipment: Crutches Prior Function Level of Independence: Independent with assistive device(s) Comments: Pt on crutches last 3 weeks Communication Communication: No difficulties    Cognition  Cognition Arousal/Alertness: Awake/alert Behavior During Therapy: WFL for tasks assessed/performed Overall Cognitive Status: Within Functional Limits for tasks assessed    Extremity/Trunk Assessment Upper Extremity Assessment Upper Extremity Assessment: Overall WFL for tasks assessed Lower Extremity Assessment Lower Extremity Assessment: RLE  deficits/detail;LLE deficits/detail RLE Deficits / Details: Strength WFL with hip abd ltd to 10 and flex to 90 2* arthritic changes LLE Deficits / Details: 2/5 hip strength with AAROM to 80 hip flex and 15 abd  Balance    End of Session PT - End of Session Equipment Utilized During Treatment: Gait belt Activity Tolerance: Patient tolerated treatment well Patient left: in chair;with call bell/phone within reach;with family/visitor present Nurse Communication: Mobility status  GP     Olevia Westervelt 02/04/2013, 5:00 PM

## 2013-02-04 NOTE — Anesthesia Procedure Notes (Signed)
Spinal  Patient location during procedure: OR Start time: 02/04/2013 8:25 AM End time: 02/04/2013 8:31 AM Staffing CRNA/Resident: Early Osmond E Performed by: resident/CRNA  Preanesthetic Checklist Completed: patient identified, site marked, surgical consent, pre-op evaluation, timeout performed, IV checked, risks and benefits discussed and monitors and equipment checked Spinal Block Patient position: sitting Prep: Betadine Patient monitoring: heart rate, continuous pulse ox and blood pressure Approach: midline Location: L2-3 Injection technique: single-shot Needle Needle type: Sprotte  Needle gauge: 24 G Needle length: 9 cm Assessment Sensory level: T6 Additional Notes Sitting position, time out, sterile prep and drape. Clear CSF pre/post injection. Patient tol well. Return to supine.

## 2013-02-04 NOTE — Preoperative (Signed)
Beta Blockers   Reason not to administer Beta Blockers:Not Applicable 

## 2013-02-04 NOTE — Anesthesia Preprocedure Evaluation (Addendum)
Anesthesia Evaluation  Patient identified by MRN, date of birth, ID band Patient awake    Reviewed: Allergy & Precautions, H&P , NPO status , Patient's Chart, lab work & pertinent test results  Airway Mallampati: III TM Distance: >3 FB Neck ROM: Full    Dental  (+) Dental Advisory Given and Teeth Intact   Pulmonary neg pulmonary ROS,  breath sounds clear to auscultation        Cardiovascular hypertension, Pt. on medications Rhythm:Regular Rate:Normal     Neuro/Psych negative neurological ROS  negative psych ROS   GI/Hepatic negative GI ROS, Neg liver ROS,   Endo/Other  Morbid obesity  Renal/GU negative Renal ROS     Musculoskeletal negative musculoskeletal ROS (+)   Abdominal (+) + obese,   Peds  Hematology negative hematology ROS (+)   Anesthesia Other Findings   Reproductive/Obstetrics                          Anesthesia Physical Anesthesia Plan  ASA: III  Anesthesia Plan: Spinal   Post-op Pain Management:    Induction:   Airway Management Planned:   Additional Equipment:   Intra-op Plan:   Post-operative Plan:   Informed Consent: I have reviewed the patients History and Physical, chart, labs and discussed the procedure including the risks, benefits and alternatives for the proposed anesthesia with the patient or authorized representative who has indicated his/her understanding and acceptance.   Dental advisory given  Plan Discussed with: CRNA  Anesthesia Plan Comments:        Anesthesia Quick Evaluation

## 2013-02-04 NOTE — Progress Notes (Signed)
Portable AP Pelvis X-ray done. 

## 2013-02-04 NOTE — Plan of Care (Signed)
Problem: Consults Goal: Diagnosis- Total Joint Replacement Left total hip     

## 2013-02-04 NOTE — Progress Notes (Signed)
X-ray results noted 

## 2013-02-04 NOTE — Op Note (Signed)
Pre-operative diagnosis- Osteoarthritis Left hip  Post-operative diagnosis- Osteoarthritis  Left hip  Procedure-  LeftTotal Hip Arthroplasty  Surgeon- Joel Rankin. Haylin Camilli, MD  Assistant- Leilani Able, PA-C   Anesthesia  Spinal  EBL- 200 ml   Drain hemovac   Complication- None  Condition-PACU - hemodynamically stable.   Brief Clinical Note- Joel Medina is a 52 y.o. male with end stage arthritis of his left hip with progressively worsening pain and dysfunction. Pain occurs with activity and rest including pain at night. He has tried analgesics, protected weight bearing and rest without benefit. Pain is too severe to attempt physical therapy. Radiographs demonstrate bone on bone arthritis with subchondral cyst formation. He presents now for left THA.  Procedure in detail-   The patient is brought into the operating room and placed on the operating table. After successful administration of Spinal  anesthesia, the patient is placed in the  Right lateral decubitus position with the  Left side up and held in place with the hip positioner. The lower extremity is isolated from the perineum with plastic drapes and time-out is performed by the surgical team. The lower extremity is then prepped and draped in the usual sterile fashion. A short posterolateral incision is made with a ten blade through the subcutaneous tissue to the level of the fascia lata which is incised in line with the skin incision. The sciatic nerve is palpated and protected and the short external rotators and capsule are isolated from the femur. The hip is then dislocated and the center of the femoral head is marked. A trial prosthesis is placed such that the trial head corresponds to the center of the patients' native femoral head. The resection level is marked on the femoral neck and the resection is made with an oscillating saw. The femoral head is removed and femoral retractors placed to gain access to the femoral canal.  The canal finder is passed into the femoral canal and the canal is thoroughly irrigated with sterile saline to remove the fatty contents. Axial reaming is performed to 15.5  mm, proximal reaming to 20 F  and the sleeve machined to a large. A 20 F large trial sleeve is placed into the proximal femur.      The femur is then retracted anteriorly to gain acetabular exposure. Acetabular retractors are placed and the labrum and osteophytes are removed, Acetabular reaming is performed to 55  mm and a 56  mm Pinnacle acetabular shell is placed in anatomic position with excellent purchase. Additional dome screws were not needed. The permanent 36 mm neutral + 4 Marathon liner is placed into the acetabular shell.      The trial femur is then placed into the femoral canal. The size is 20 x 15  stem with a 36 + 12  neck and a 36 + 6 head with the neck version matching  the patients' native anteversion. The hip is reduced with excellent stability with full extension and full external rotation, 70 degrees flexion with 40 degrees adduction and 90 degrees internal rotation and 90 degrees of flexion with 70 degrees of internal rotation. The operative leg is placed on top of the non-operative leg and the leg lengths are found to be equal. The trials are then removed and the permanent implant of the same size is impacted into the femoral canal. The ceramic  femoral head of the same size as the trial is placed and the hip is reduced with the same stability parameters. The operative  leg is again placed on top of the non-operative leg and the leg lengths are found to be equal.      The wound is then copiously irrigated with saline solution and the capsule and short external rotators are re-attached to the femur through drill holes with Ethibond suture. The fascia lata is closed over a hemovac drain with #1 vicryl suture and the fascia lata, gluteal muscles and subcutaneous tissues are injected with Exparel 20ml diluted with saline 30  ml plus 20 ml of .25% Marcaine. The subcutaneous tissues are closed with #1 and2-0 vicryl and the subcuticular layer closed with running 4-0 Monocryl. The drain is hooked to suction, incision cleaned and dried, and steri-srips and a bulky sterile dressing applied. The limb is placed into a knee immobilizer and the patient is awakened and transported to recovery in stable condition.      Please note that a surgical assistant was a medical necessity for this procedure in order to perform it in a safe and expeditious manner. The assistant was necessary to provide retraction to the vital neurovascular structures and to retract and position the limb to allow for anatomic placement of the prosthetic components.  Joel Rankin Kerigan Narvaez, MD    02/04/2013, 9:52 AM

## 2013-02-04 NOTE — Interval H&P Note (Signed)
History and Physical Interval Note:  02/04/2013 7:48 AM  Joel Medina  has presented today for surgery, with the diagnosis of OA OF LEFT HIP  The various methods of treatment have been discussed with the patient and family. After consideration of risks, benefits and other options for treatment, the patient has consented to  Procedure(s): LEFT TOTAL HIP ARTHROPLASTY (Left) as a surgical intervention .  The patient's history has been reviewed, patient examined, no change in status, stable for surgery.  I have reviewed the patient's chart and labs.  Questions were answered to the patient's satisfaction.     Loanne Drilling

## 2013-02-04 NOTE — H&P (View-Only) (Signed)
Joel Medina  DOB: 10/15/1960 Married / Language: English / Race: White Male  Date of Admission:  02/04/2013  Chief Complaint:  Left Hip Pain  History of Present Illness The patient is a 52 year old male who comes in for a preoperative History and Physical. The patient is scheduled for a left total hip arthroplasty to be performed by Dr. Frank V. Aluisio, MD at [facility] on 02/04/2013. The patient is a 52 year old male who presents with a hip problem. The patient is seen for a second opinion.The patient reports left hip problems including pain and stiffness symptoms that have been present for 2 year(s) (increased for the past year). The symptoms began without any known injury. The patient reports symptoms radiating to the: left groin.The patient feels as if their symptoms are does feel they are worsening. Current treatment includes opioid analgesics (hydrocodone). Prior to being seen today the patient was previously evaluated by a colleague ([name] as well as [facility]) 5 month(s) ago. Previous workup for this problem has included hip x-rays. Previous treatment for this problem has included corticosteroid injection (intra-articular injection on February 19th. He states the injection helped for a short time, but then the pain returned and he has not been able to get any further relief) and nonsteroidal anti-inflammatory drugs. All of his issues have been present for about a year. They have gotten much worse in the past 6 months. He had an intra-articular injection at Dr. Murphy's office earlier this year that only helped for a couple of weeks. After that the pain has come back intensely.  He has pain in his groin radiating to his knee, also in the lateral hip, radiating just below the knee, and in the buttock. Each of these pains has a distinct quality to it, but all of them are giving him a lot of trouble. He's had marked functional limitations because of  loss of motion. He works for [employer] in a sedentary position and has been able to keep working but it is getting increasingly difficult to do so.  He is ready to get the hip fixed. They have been treated conservatively in the past for the above stated problem and despite conservative measures, they continue to have progressive pain and severe functional limitations and dysfunction. They have failed non-operative management including home exercise, medications. It is felt that they would benefit from undergoing total joint replacement. Risks and benefits of the procedure have been discussed with the patient and they elect to proceed with surgery. There are no active contraindications to surgery such as ongoing infection or rapidly progressive neurological disease.  Problem List Osteoarthritis, Hip   Allergies No Known Drug Allergies   Family History Hypertension. mother, brother and grandfather mothers side Diabetes Mellitus. grandfather mothers side Heart Disease. grandfather mothers side   Social History Number of flights of stairs before winded. less than 1 Pain Contract. 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 live with spouse Marital status. married Tobacco use. never smoker; smoke(d) less than 1/2 pack(s) per day; uses less than half 1/2 can(s) smokeless per week Previously in rehab. no Tobacco / smoke exposure. no Illicit drug use. no Alcohol use. current drinker; drinks beer; 8-14 per week Drug/Alcohol Rehab (Currently). no Exercise. Exercises rarely; does other Children. 4 Current work status. working full time   Medication History Hydrocodone-Acetaminophen (10-325MG Tablet, Oral) Active. AmLODIPine Besylate (10MG Tablet, Oral) Active. Benicar HCT (40-25MG Tablet, Oral) Active.     Past Surgical History Bunionectomy Ulnar Surgery   Medical History High blood  pressure Obesity Hypercholesterolemia   Review of Systems General:Not Present- Chills, Fever, Night Sweats, Fatigue, Weight Gain, Weight Loss and Memory Loss. Skin:Not Present- Hives, Itching, Rash, Eczema and Lesions. HEENT:Not Present- Tinnitus, Headache, Double Vision, Visual Loss, Hearing Loss and Dentures. Respiratory:Not Present- Shortness of breath with exertion, Shortness of breath at rest, Allergies, Coughing up blood and Chronic Cough. Cardiovascular:Not Present- Chest Pain, Racing/skipping heartbeats, Difficulty Breathing Lying Down, Murmur, Swelling and Palpitations. Gastrointestinal:Not Present- Bloody Stool, Heartburn, Abdominal Pain, Vomiting, Nausea, Constipation, Diarrhea, Difficulty Swallowing, Jaundice and Loss of appetitie. Male Genitourinary:Not Present- Urinary frequency, Blood in Urine, Weak urinary stream, Discharge, Flank Pain, Incontinence, Painful Urination, Urgency, Urinary Retention and Urinating at Night. Musculoskeletal:Present- Muscle Pain, Joint Pain, Spasms and Muscle Cramps. Not Present- Muscle Weakness, Joint Swelling, Back Pain and Morning Stiffness. Neurological:Not Present- Tremor, Dizziness, Blackout spells, Paralysis, Difficulty with balance and Weakness. Psychiatric:Not Present- Insomnia.   Vitals Weight: 330 lb Height: 75 in Weight was reported by patient. Height was reported by patient. Body Surface Area: 2.81 m Body Mass Index: 41.25 kg/m Pulse: 84 (Regular) Resp.: 12 (Unlabored) BP: 122/72 (Sitting, Left Arm, Standard)    Physical Exam The physical exam findings are as follows:   General Mental Status - Alert, cooperative and good historian. General Appearance- pleasant. Not in acute distress. Orientation- Oriented X3. Build & Nutrition- Overweight, Obese and Well developed.   Head and Neck Head- normocephalic, atraumatic . Neck Global Assessment- supple. no bruit auscultated on the right and no  bruit auscultated on the left.   Eye Vision- Wears corrective lenses. Pupil- Bilateral- Regular and Round. Motion- Bilateral- EOMI.   Chest and Lung Exam Auscultation: Breath sounds:- clear at anterior chest wall and - clear at posterior chest wall. Adventitious sounds:- No Adventitious sounds.   Cardiovascular Auscultation:Rhythm- Regular rate and rhythm. Heart Sounds- S1 WNL and S2 WNL. Murmurs & Other Heart Sounds:Auscultation of the heart reveals - No Murmurs.   Abdomen Inspection:Contour- Generalized moderate distention. Palpation/Percussion:Tenderness- Abdomen is non-tender to palpation. Rigidity (guarding)- Abdomen is soft. Auscultation:Auscultation of the abdomen reveals - Bowel sounds normal.   Male Genitourinary Not done, not pertinent to present illness  Musculoskeletal On exam, well-developed male, alert and oriented, in no apparent distress. Evaluation of his right hip shows flexion to 110, rotation in 10, out 30, abduction 30 without discomfort. Left hip flexion 90. No internal or external rotation. Abduction about 20. Left knee exam is normal. Slight peripheral edema in both legs. Pulses are intact with intact motor and sensation.  Radiographs - brought over from February from Murphy-Wainer's office. He has severe advanced left hip arthritis, bone on bone, with some flattening of the femoral head and massive osteophytes. He also has arthritic change in the right hip but to a lesser degree.  Assessment & Plan Osteoarthritis, Hip (715.35) Impression: Left Hip  Note: Plan is for a Left Total Hip Replacement by Dr. Aluisio.  Plan is to go home.  PCP - Dr. A. Plotnikov - Patient has been seen preoperatively and felt to be stable for surgery.  The patient does not have any contraindications and will recieve TXA (tranexamic acid) prior to surgery.  Time spent ~ 20 minutes  Signed electronically by Malicia Blasdel L Carie Kapuscinski, III PA-C   

## 2013-02-04 NOTE — Transfer of Care (Signed)
Immediate Anesthesia Transfer of Care Note  Patient: Joel Medina  Procedure(s) Performed: Procedure(s) (LRB): LEFT TOTAL HIP ARTHROPLASTY (Left)  Patient Location: PACU  Anesthesia Type: Spinal  Level of Consciousness: sedated, patient cooperative and responds to stimulaton  Airway & Oxygen Therapy: Patient Spontanous Breathing and Patient connected to face mask oxgen  Post-op Assessment: Report given to PACU RN and Post -op Vital signs reviewed and stable  Post vital signs: Reviewed and stable  Complications: No apparent anesthesia complications

## 2013-02-05 ENCOUNTER — Encounter (HOSPITAL_COMMUNITY): Payer: Self-pay | Admitting: Orthopedic Surgery

## 2013-02-05 LAB — CBC
HCT: 37.6 % — ABNORMAL LOW (ref 39.0–52.0)
Platelets: 173 10*3/uL (ref 150–400)
RDW: 12.9 % (ref 11.5–15.5)
WBC: 12.3 10*3/uL — ABNORMAL HIGH (ref 4.0–10.5)

## 2013-02-05 LAB — BASIC METABOLIC PANEL
BUN: 17 mg/dL (ref 6–23)
Chloride: 103 mEq/L (ref 96–112)
GFR calc Af Amer: >90 mL/min (ref >90)
GFR calc non Af Amer: >90 mL/min (ref >90)
Potassium: 4.1 mEq/L (ref 3.5–5.1)
Sodium: 135 mEq/L (ref 135–145)

## 2013-02-05 MED ORDER — METHOCARBAMOL 500 MG PO TABS: 500.0000 mg | ORAL_TABLET | Freq: Four times a day (QID) | ORAL | Status: DC | PRN

## 2013-02-05 MED ORDER — TRAMADOL HCL 50 MG PO TABS: 50.0000 mg | ORAL_TABLET | Freq: Four times a day (QID) | ORAL | Status: DC | PRN

## 2013-02-05 MED ORDER — OXYCODONE HCL 5 MG PO TABS: 5.0000 mg | ORAL_TABLET | ORAL | Status: DC | PRN

## 2013-02-05 MED ORDER — RIVAROXABAN 10 MG PO TABS: 10.0000 mg | ORAL_TABLET | Freq: Every day | ORAL | Status: DC

## 2013-02-05 NOTE — Progress Notes (Signed)
   Subjective: 1 Day Post-Op Procedure(s) (LRB): LEFT TOTAL HIP ARTHROPLASTY (Left) Patient reports pain as mild.   Had spasms last night after getting back to bed but much better now with Robaxin Would like to go home this afternoon if possible Plan is to go Home after hospital stay.  Objective: Vital signs in last 24 hours: Temp:  [97.5 F (36.4 C)-98.7 F (37.1 C)] 98 F (36.7 C) (08/21 0515) Pulse Rate:  [60-84] 73 (08/21 0515) Resp:  [8-20] 20 (08/21 0515) BP: (98-136)/(66-81) 129/81 mmHg (08/21 0515) SpO2:  [94 %-100 %] 98 % (08/21 0515) FiO2 (%):  [2 %] 2 % (08/20 1230) Weight:  [329 lb (149.233 kg)] 329 lb (149.233 kg) (08/20 1230)  Intake/Output from previous day:  Intake/Output Summary (Last 24 hours) at 02/05/13 0712 Last data filed at 02/05/13 0519  Gross per 24 hour  Intake 4041.67 ml  Output   2940 ml  Net 1101.67 ml    Intake/Output this shift:    Labs:  Recent Labs  02/05/13 0439  HGB 13.2    Recent Labs  02/05/13 0439  WBC 12.3*  RBC 4.15*  HCT 37.6*  PLT 173    Recent Labs  02/05/13 0439  NA 135  K 4.1  CL 103  CO2 22  BUN 17  CREATININE 0.72  GLUCOSE 211*  CALCIUM 8.8   No results found for this basename: LABPT, INR,  in the last 72 hours  EXAM General - Patient is Alert, Appropriate and Oriented Extremity - Neurologically intact Neurovascular intact No cellulitis present Compartment soft Dressing - dressing C/D/I Motor Function - intact, moving foot and toes well on exam.  Hemovac pulled without difficulty.  Past Medical History  Diagnosis Date  . HTN (hypertension)   . Obesity   . History of pyelonephritis   . Arthritis     OA BOTH HIPS - PAIN IN LEFT HIP WORSE    Assessment/Plan: 1 Day Post-Op Procedure(s) (LRB): LEFT TOTAL HIP ARTHROPLASTY (Left) Principal Problem:   OA (osteoarthritis) of hip   Advance diet Up with therapy D/C IV fluids Discharge home with home health  DVT Prophylaxis -  Xarelto Weight Bearing As Tolerated right Leg D/C Knee Immobilizer Hemovac Pulled Continue Therapy Hip Precautions Probably home today after 2 sessions of PT   Tekeya Geffert V

## 2013-02-05 NOTE — Care Management Note (Signed)
    Page 1 of 2   02/05/2013     5:05:28 PM   CARE MANAGEMENT NOTE 02/05/2013  Patient:  NASRI, BOAKYE   Account Number:  [id number]  Date Initiated:  02/05/2013  Documentation initiated by:  Colleen Can  Subjective/Objective Assessment:   dx OA left hip; total hip replacemnt    Pre-arranged with Genevieve Norlander to start HHpt services which will start within 48hrs of discharge.l     Action/Plan:   CM spoke with patient and spouse. Plans are for patient to return to his home where spouse will be caregiver. Pt will require HHpt and [facility] provide the services.   Anticipated DC Date:  02/05/2013   Anticipated DC Plan:  HOME W HOME HEALTH SERVICES      DC Planning Services  CM consult      [facility] Choice  HOME HEALTH  DURABLE MEDICAL EQUIPMENT   Choice offered to / List presented to:  C-1 Patient   DME arranged  OTHER - SEE COMMENT      DME agency  [facility]     HH arranged  HH-2 PT      [facility] agency  [facility]   Status of service:  Completed, signed off Medicare Important Message given?   (If response is "NO", the following Medicare IM given date fields will be blank) Date Medicare IM given:   Date Additional Medicare IM given:    Discharge Disposition:  HOME W HOME HEALTH SERVICES  Per UR Regulation:    If discussed at Long Length of Stay Meetings, dates discussed:    Comments:  02/05/2013 Colleen Can BSN RN CCM [phone] Spouse has purchased bariatric 3n1. [facility] delivered bariatric RW.

## 2013-02-05 NOTE — Progress Notes (Signed)
[facility]  [facility] is providing the following services: RW (patient declined commode)  If patient discharges after hours, please call [phone].   Renard Hamper 02/05/2013, 11:08 AM

## 2013-02-05 NOTE — Progress Notes (Signed)
Physical Therapy Treatment Patient Details Name: Joel Medina MRN: [id number] DOB: [birth year] Today's Date: 02/05/2013 Time: 4098-1191 PT Time Calculation (min): 29 min  PT Assessment / Plan / Recommendation  History of Present Illness pt was admitted for L posterior approach THA.   PT Comments   Pt and spouse educated on car transfers and stairs.  Follow Up Recommendations  Home health PT     Does the patient have the potential to tolerate intense rehabilitation     Barriers to Discharge        Equipment Recommendations  Rolling walker with 5" wheels    Recommendations for Other Services OT consult  Frequency 7X/week   Progress towards PT Goals Progress towards PT goals: Progressing toward goals  Plan Current plan remains appropriate    Precautions / Restrictions Precautions Precautions: Posterior Hip Precaution Comments: Sign hung in room Restrictions Weight Bearing Restrictions: No Other Position/Activity Restrictions: WBAT   Pertinent Vitals/Pain 4/10; premed, ice pack provided    Mobility  Bed Mobility Bed Mobility: Supine to Sit Supine to Sit: 4: Min assist Details for Bed Mobility Assistance: min assist with L LE Transfers Transfers: Sit to Stand;Stand to Sit Sit to Stand: 5: Supervision Stand to Sit: 5: Supervision Details for Transfer Assistance: cues for LE management and use of UEs to self assist Ambulation/Gait Ambulation/Gait Assistance: 5: Supervision Ambulation Distance (Feet): 75 Feet (twice) Assistive device: Rolling walker Ambulation/Gait Assistance Details: cues for Position from RW and ER on L Gait Pattern: Step-to pattern;Step-through pattern;Decreased stance time - left;Wide base of support Stairs: Yes Stairs Assistance: 4: Min assist Stairs Assistance Details (indicate cue type and reason): cues for sequence and foot/crutch placement Stair Management Technique: One rail Right;Step to pattern;Forwards;With crutches Number of Stairs: 4     Exercises     PT Diagnosis:    PT Problem List:   PT Treatment Interventions:     PT Goals (current goals can now be found in the care plan section) Acute Rehab PT Goals Patient Stated Goal: Walk without pain PT Goal Formulation: With patient Time For Goal Achievement: 02/07/13 Potential to Achieve Goals: Good  Visit Information  Last PT Received On: 02/05/13 Assistance Needed: +1 History of Present Illness: pt was admitted for L posterior approach THA.    Subjective Data  Patient Stated Goal: Walk without pain   Cognition  Cognition Arousal/Alertness: Awake/alert Behavior During Therapy: WFL for tasks assessed/performed Overall Cognitive Status: Within Functional Limits for tasks assessed    Balance     End of Session PT - End of Session Equipment Utilized During Treatment: Gait belt Activity Tolerance: Patient tolerated treatment well Patient left: in chair;with call bell/phone within reach;with family/visitor present Nurse Communication: Mobility status   GP     Joel Medina 02/05/2013, 3:42 PM

## 2013-02-05 NOTE — Progress Notes (Signed)
Patient ready for d/c home. Transported to automobile via wheelchair accompanied per wife. Left with belongings and  walker for home use.

## 2013-02-05 NOTE — Evaluation (Signed)
Occupational Therapy Evaluation Patient Details Name: Joel Medina MRN: [id number] DOB: March 26, 1961 Today's Date: 02/05/2013 Time: 1207-1218, 1247- 2258 & 1335 - 1340 OT Time Calculation (min): 28 min  OT Assessment / Plan / Recommendation History of present illness pt was admitted for L posterior approach THA.   Clinical Impression   Pt was admitted for the the above procedure.  He verbalizes understanding of all precautions related to adls..  No further OT is needed at this time.      OT Assessment       Follow Up Recommendations       Barriers to Discharge      Equipment Recommendations       Recommendations for Other Services    Frequency       Precautions / Restrictions Precautions Precautions: Posterior Hip Precaution Comments: Sign hung in room Restrictions Weight Bearing Restrictions: No Other Position/Activity Restrictions: WBAT   Pertinent Vitals/Pain Pt c/o soreness in L hip:  Did not want to sit up/change position    ADL  ADL Comments: pt seen for education for adls and posterior thps.  Used AE at bed level.  Tried both regular sock aid which was too small and wide sock aid.  This released sock too early.  Gave pt extra velcro to put on sides of sock aide once he gets this.  Pt verbalizes understanding of all.  Did not feel he needed to practice reacher or shoehorn.  Demonstrated using reacher to start shoe and standing with shoehorn to push heel in.  Also reviewed tub readiness/sidestepping over when ready to keep precautions.   Wife is getting wide 3:1 commode   Spoke to wife when she arrived.  She will plan to help with ADLs and may look into reacher/long shoehorn.  They will help with socks.  Reviewed tub transfer, and pt will practice with HHPT when he is ready to step into tub.     OT Diagnosis:    OT Problem List:   OT Treatment Interventions:     OT Goals(Current goals can be found in the care plan section) Acute Rehab OT Goals Patient Stated Goal:  Walk without pain  Visit Information  Last OT Received On: 02/05/13 Assistance Needed: +1 History of Present Illness: pt was admitted for L posterior approach THA.       Prior Functioning     Home Living Family/patient expects to be discharged to:: Private residence Additional Comments: plans to sponge bathe initially.  Wife is looking into AE kit Prior Function Level of Independence: Independent with assistive device(s) Comments: Pt on crutches last 3 weeks Communication Communication: No difficulties         Vision/Perception     Cognition  Cognition Arousal/Alertness: Awake/alert Behavior During Therapy: WFL for tasks assessed/performed Overall Cognitive Status: Within Functional Limits for tasks assessed    Extremity/Trunk Assessment Upper Extremity Assessment Upper Extremity Assessment: Overall WFL for tasks assessed     Mobility Did not mobilize during OT eval:  Min guard with PT    Exercise    Balance     End of Session OT - End of Session Activity Tolerance: Patient tolerated treatment well Patient left: in bed;with call bell/phone within reach  GO     [facility] 02/05/2013, 2:31 PM Marica Otter, OTR/L [phone] 02/05/2013

## 2013-02-05 NOTE — Progress Notes (Signed)
Physical Therapy Treatment Patient Details Name: Joel Medina MRN: [id number] DOB: 02-03-1961 Today's Date: 02/05/2013 Time: 1191-4782 PT Time Calculation (min): 35 min  PT Assessment / Plan / Recommendation  History of Present Illness     PT Comments     Follow Up Recommendations  Home health PT     Does the patient have the potential to tolerate intense rehabilitation     Barriers to Discharge        Equipment Recommendations  Rolling walker with 5" wheels (Wide RW)    Recommendations for Other Services OT consult  Frequency 7X/week   Progress towards PT Goals Progress towards PT goals: Progressing toward goals  Plan Current plan remains appropriate    Precautions / Restrictions Precautions Precautions: Posterior Hip Precaution Comments: Sign hung in room Restrictions Weight Bearing Restrictions: No Other Position/Activity Restrictions: WBAT   Pertinent Vitals/Pain 6/10; premed, ice packs provided    Mobility  Bed Mobility Bed Mobility: Sit to Supine Sit to Supine: 4: Min assist Details for Bed Mobility Assistance: cues for sequence, adherence to THP and use of R LE to self assist - Physical assist with L LE Transfers Transfers: Sit to Stand;Stand to Sit Sit to Stand: 4: Min guard Stand to Sit: 4: Min guard Details for Transfer Assistance: cues for LE management and use of UEs to self assist Ambulation/Gait Ambulation/Gait Assistance: 4: Min guard;5: Supervision Ambulation Distance (Feet): 123 Feet Assistive device: Rolling walker Ambulation/Gait Assistance Details: Min cues for position from RW, BOS, ER on L, and hip precautions in turning Gait Pattern: Step-to pattern;Step-through pattern;Decreased stance time - left;Wide base of support Stairs: No    Exercises Total Joint Exercises Ankle Circles/Pumps: AROM;10 reps;Supine;Both Quad Sets: AROM;10 reps;Both;Supine Gluteal Sets: AROM;Both;10 reps;Supine Heel Slides: AAROM;Supine;Left;20 reps Hip  ABduction/ADduction: AAROM;15 reps;Supine;Left   PT Diagnosis:    PT Problem List:   PT Treatment Interventions:     PT Goals (current goals can now be found in the care plan section) Acute Rehab PT Goals Patient Stated Goal: Walk without pain PT Goal Formulation: With patient Time For Goal Achievement: 02/07/13 Potential to Achieve Goals: Good  Visit Information  Last PT Received On: 02/05/13 Assistance Needed: +1    Subjective Data  Subjective: I can't lay flat or I have awful spasms Patient Stated Goal: Walk without pain   Cognition  Cognition Arousal/Alertness: Awake/alert Behavior During Therapy: WFL for tasks assessed/performed Overall Cognitive Status: Within Functional Limits for tasks assessed    Balance     End of Session PT - End of Session Equipment Utilized During Treatment: Gait belt Activity Tolerance: Patient tolerated treatment well Patient left: in bed;with call bell/phone within reach;with family/visitor present Nurse Communication: Mobility status   GP     Joel Medina 02/05/2013, 12:24 PM

## 2013-02-08 NOTE — Discharge Summary (Signed)
Physician Discharge Summary   Patient ID: Joel Medina MRN: [id number] DOB/AGE: July 09, 1960 52 y.o.  Admit date: 02/04/2013 Discharge date: 02/05/2013  Primary Diagnosis: Osteoarthritis left hip  Admission Diagnoses:  Past Medical History  Diagnosis Date  . HTN (hypertension)   . Obesity   . History of pyelonephritis   . Arthritis     OA BOTH HIPS - PAIN IN LEFT HIP WORSE   Discharge Diagnoses:   Principal Problem:   OA (osteoarthritis) of hip  Estimated body mass index is 41.12 kg/(m^2) as calculated from the following:   Height as of this encounter: 6\' 3"  (1.905 m).   Weight as of this encounter: 329 lb (149.233 kg).  Procedure(s) (LRB): LEFT TOTAL HIP ARTHROPLASTY (Left)   Consults: None  HPI: The patient is a 52 year old male who comes in for a preoperative History and Physical. The patient is scheduled for a left total hip arthroplasty to be performed by Dr. Gus Rankin. Maribell Demeo, MD at [facility] on 02/04/2013.  The patient is a 52 year old male who presents with a hip problem. The patient is seen for a second opinion.The patient reports left hip problems including pain and stiffness symptoms that have been present for 2 year(s) (increased for the past year). The symptoms began without any known injury. The patient reports symptoms radiating to the: left groin.The patient feels as if their symptoms are does feel they are worsening. Current treatment includes opioid analgesics (hydrocodone). Prior to being seen today the patient was previously evaluated by a colleague Delbert Harness as well as Museum/gallery conservator) 5 month(s) ago. Previous workup for this problem has included hip x-rays. Previous treatment for this problem has included corticosteroid injection (intra-articular injection on February 19th. He states the injection helped for a short time, but then the pain returned and he has not been able to get any further relief) and nonsteroidal anti-inflammatory drugs.    All of his issues have been present for about a year. They have gotten much worse in the past 6 months. He had an intra-articular injection at Dr. Greig Right office earlier this year that only helped for a couple of weeks. After that the pain has come back intensely.  He has pain in his groin radiating to his knee, also in the lateral hip, radiating just below the knee, and in the buttock. Each of these pains has a distinct quality to it, but all of them are giving him a lot of trouble. He's had marked functional limitations because of loss of motion. He works for [employer] in a sedentary position and has been able to keep working but it is getting increasingly difficult to do so. He is ready to get the hip fixed.  They have been treated conservatively in the past for the above stated problem and despite conservative measures, they continue to have progressive pain and severe functional limitations and dysfunction. They have failed non-operative management including home exercise, medications. It is felt that they would benefit from undergoing total joint replacement. Risks and benefits of the procedure have been discussed with the patient and they elect to proceed with surgery. There are no active contraindications to surgery such as ongoing infection or rapidly progressive neurological disease.  Laboratory Data: Admission on 02/04/2013, Discharged on 02/05/2013  Component Date Value Range Status  . WBC 02/05/2013 12.3* 4.0 - 10.5 K/uL Final  . RBC 02/05/2013 4.15* 4.22 - 5.81 MIL/uL Final  . Hemoglobin 02/05/2013 13.2  13.0 -  17.0 g/dL Final  . HCT 16/03/9603 37.6* 39.0 - 52.0 % Final  . MCV 02/05/2013 90.6  78.0 - 100.0 fL Final  . MCH 02/05/2013 31.8  26.0 - 34.0 pg Final  . MCHC 02/05/2013 35.1  30.0 - 36.0 g/dL Final  . RDW 54/02/8118 12.9  11.5 - 15.5 % Final  . Platelets 02/05/2013 173  150 - 400 K/uL Final  . Sodium 02/05/2013 135  135 - 145 mEq/L Final  . Potassium  02/05/2013 4.1  3.5 - 5.1 mEq/L Final  . Chloride 02/05/2013 103  96 - 112 mEq/L Final  . CO2 02/05/2013 22  19 - 32 mEq/L Final  . Glucose, Bld 02/05/2013 211* 70 - 99 mg/dL Final  . BUN 14/78/2956 17  6 - 23 mg/dL Final  . Creatinine, Ser 02/05/2013 0.72  0.50 - 1.35 mg/dL Final  . Calcium 21/30/8657 8.8  8.4 - 10.5 mg/dL Final  . GFR calc non Af Amer 02/05/2013 >90  >90 mL/min Final  . GFR calc Af Amer 02/05/2013 >90  >90 mL/min Final   Comment: (NOTE)                          The eGFR has been calculated using the CKD EPI equation.                          This calculation has not been validated in all clinical situations.                          eGFR's persistently <90 mL/min signify possible Chronic Kidney                          Disease.  Hospital Outpatient Visit on 01/28/2013  Component Date Value Range Status  . MRSA, PCR 01/28/2013 NEGATIVE  NEGATIVE Final  . Staphylococcus aureus 01/28/2013 NEGATIVE  NEGATIVE Final   Comment:                                 The Xpert SA Assay (FDA                          approved for NASAL specimens                          in patients over 64 years of age),                          is one component of                          a comprehensive surveillance                          program.  Test performance has                          been validated by [employer] for patients greater  than or equal to 38 year old.                          It is not intended                          to diagnose infection nor to                          guide or monitor treatment.  Marland Kitchen aPTT 01/28/2013 55* 24 - 37 seconds Final   Comment:                                 IF BASELINE aPTT IS ELEVATED,                          SUGGEST PATIENT RISK ASSESSMENT                          BE USED TO DETERMINE APPROPRIATE                          ANTICOAGULANT THERAPY.  . WBC 01/28/2013 6.4  4.0 - 10.5 K/uL Final    . RBC 01/28/2013 4.72  4.22 - 5.81 MIL/uL Final  . Hemoglobin 01/28/2013 15.3  13.0 - 17.0 g/dL Final  . HCT 78/29/5621 42.7  39.0 - 52.0 % Final  . MCV 01/28/2013 90.5  78.0 - 100.0 fL Final  . MCH 01/28/2013 32.4  26.0 - 34.0 pg Final  . MCHC 01/28/2013 35.8  30.0 - 36.0 g/dL Final  . RDW 30/86/5784 12.7  11.5 - 15.5 % Final  . Platelets 01/28/2013 216  150 - 400 K/uL Final  . Sodium 01/28/2013 136  135 - 145 mEq/L Final  . Potassium 01/28/2013 3.6  3.5 - 5.1 mEq/L Final  . Chloride 01/28/2013 102  96 - 112 mEq/L Final  . CO2 01/28/2013 25  19 - 32 mEq/L Final  . Glucose, Bld 01/28/2013 144* 70 - 99 mg/dL Final  . BUN 69/62/9528 11  6 - 23 mg/dL Final  . Creatinine, Ser 01/28/2013 0.73  0.50 - 1.35 mg/dL Final  . Calcium 41/32/4401 9.3  8.4 - 10.5 mg/dL Final  . Total Protein 01/28/2013 7.1  6.0 - 8.3 g/dL Final  . Albumin 02/72/5366 3.8  3.5 - 5.2 g/dL Final  . AST 44/08/4740 17  0 - 37 U/L Final  . ALT 01/28/2013 18  0 - 53 U/L Final  . Alkaline Phosphatase 01/28/2013 70  39 - 117 U/L Final  . Total Bilirubin 01/28/2013 0.5  0.3 - 1.2 mg/dL Final  . GFR calc non Af Amer 01/28/2013 >90  >90 mL/min Final  . GFR calc Af Amer 01/28/2013 >90  >90 mL/min Final   Comment:                                 The eGFR has been calculated                          using the CKD EPI equation.  This calculation has not been                          validated in all clinical                          situations.                          eGFR's persistently                          <90 mL/min signify                          possible Chronic Kidney Disease.  Marland Kitchen Prothrombin Time 01/28/2013 13.7  11.6 - 15.2 seconds Final  . INR 01/28/2013 1.07  0.00 - 1.49 Final  . ABO/RH(D) 01/28/2013 A POS   Final  . Antibody Screen 01/28/2013 NEG   Final  . Sample Expiration 01/28/2013 02/07/2013   Final  . Color, Urine 01/28/2013 YELLOW  YELLOW Final  . APPearance 01/28/2013 CLEAR   CLEAR Final  . Specific Gravity, Urine 01/28/2013 1.019  1.005 - 1.030 Final  . pH 01/28/2013 6.0  5.0 - 8.0 Final  . Glucose, UA 01/28/2013 NEGATIVE  NEGATIVE mg/dL Final  . Hgb urine dipstick 01/28/2013 NEGATIVE  NEGATIVE Final  . Bilirubin Urine 01/28/2013 NEGATIVE  NEGATIVE Final  . Ketones, ur 01/28/2013 NEGATIVE  NEGATIVE mg/dL Final  . Protein, ur 16/03/9603 NEGATIVE  NEGATIVE mg/dL Final  . Urobilinogen, UA 01/28/2013 1.0  0.0 - 1.0 mg/dL Final  . Nitrite 54/02/8118 NEGATIVE  NEGATIVE Final  . Leukocytes, UA 01/28/2013 NEGATIVE  NEGATIVE Final   MICROSCOPIC NOT DONE ON URINES WITH NEGATIVE PROTEIN, BLOOD, LEUKOCYTES, NITRITE, OR GLUCOSE <1000 mg/dL.  . ABO/RH(D) 01/28/2013 A POS   Final     X-Rays:Dg Chest 2 View  01/28/2013   *RADIOLOGY REPORT*  Clinical Data: Preop left total hip replacement.  Left hip pain.  CHEST - 2 VIEW  Comparison: None.  Findings: Trachea is midline.  Heart size normal.  Right hemidiaphragm is mildly elevated with scarring in the adjacent right lower lobe.  Lungs are otherwise clear.  No pleural fluid. Degenerative osteophytosis is seen throughout the spine with prominent degenerative changes in the acromioclavicular joints bilaterally.  IMPRESSION: No acute findings.   Original Report Authenticated By: Leanna Battles, M.D.   Dg Hip Complete Left  01/28/2013   *RADIOLOGY REPORT*  Clinical Data: Preop left total hip replacement.  Left hip pain.  LEFT HIP - COMPLETE 2+ VIEW  Comparison: None.  Findings: There is complete joint space loss in the superior aspect of the left hip, with extensive subchondral sclerosis and cyst formation, as well as osteophytosis. Flattening and irregularity of the left femoral head is seen in association.  Mild medial joint space narrowing in the right hip with moderate subchondral sclerosis and osteophytosis.  Obturator rings are intact.  IMPRESSION:  1.  Advanced left hip osteoarthritis with flattening and irregularity of the left  femoral head. 2.  Moderate right hip osteoarthritis.   Original Report Authenticated By: Leanna Battles, M.D.   Dg Pelvis Portable  02/04/2013   *RADIOLOGY REPORT*  Clinical Data: Post left total hip replacement  PORTABLE PELVIS  Comparison: Portable exam 1044 hours compared to 01/28/2013  Findings: Interval resection of the left femoral  head/neck and placement of components of a left hip prosthesis in expected positions. No fracture or dislocation. Surgical drain present. Osteoarthritic changes of right hip and osseous demineralization again noted.  IMPRESSION: Left hip prosthesis without acute complication.   Original Report Authenticated By: Ulyses Southward, M.D.    EKG: Orders placed during the hospital encounter of 02/04/13  . EKG     Hospital Course: Patient was admitted to [facility] and taken to the OR and underwent the above state procedure without complications.  Patient tolerated the procedure well and was later transferred to the recovery room and then to the orthopaedic floor for postoperative care.  They were given PO and IV analgesics for pain control following their surgery.  They were given 24 hours of postoperative antibiotics of  Anti-infectives   Start     Dose/Rate Route Frequency Ordered Stop   02/04/13 1400  ceFAZolin (ANCEF) IVPB 2 g/50 mL premix     2 g 100 mL/hr over 30 Minutes Intravenous Every 6 hours 02/04/13 1233 02/04/13 2028   02/04/13 0640  ceFAZolin (ANCEF) 3 g in dextrose 5 % 50 mL IVPB     3 g 160 mL/hr over 30 Minutes Intravenous On call to O.R. 02/04/13 2841 02/04/13 0825     and started on DVT prophylaxis in the form of Xarelto.   PT and OT were ordered for total hip protocol.  The patient was allowed to be WBAT with therapy. Discharge planning was consulted to help with postop disposition and equipment needs.  Patient had a comfortable night on the evening of surgery.  They started to get up OOB with therapy on day one.  Hemovac drain was pulled  without difficulty.  The knee immobilizer was removed and discontinued. He ambulated with Physical therapy that afternoon and tolerated it well. On POD 1 he had 2 excellent PT sessions and was independent with activities to allow for discharge home. He is discharged in stable condition, tolerating a regular diet and comfortable on oral analgesics.     Discharge Medications: Prior to Admission medications   Medication Sig Start Date End Date Taking? Authorizing Provider  amLODipine (NORVASC) 10 MG tablet Take 10 mg by mouth every morning.    Yes Historical Provider, MD  olmesartan-hydrochlorothiazide (BENICAR HCT) 40-25 MG per tablet Take 1 tablet by mouth every morning.    Yes Historical Provider, MD  methocarbamol (ROBAXIN) 500 MG tablet Take 1 tablet (500 mg total) by mouth every 6 (six) hours as needed. 02/05/13   Loanne Drilling, MD  oxyCODONE (OXY IR/ROXICODONE) 5 MG immediate release tablet Take 1-4 tablets (5-20 mg total) by mouth every 3 (three) hours as needed. 02/05/13   Loanne Drilling, MD  rivaroxaban (XARELTO) 10 MG TABS tablet Take 1 tablet (10 mg total) by mouth daily with breakfast. 02/05/13   Loanne Drilling, MD  traMADol (ULTRAM) 50 MG tablet Take 1-2 tablets (50-100 mg total) by mouth every 6 (six) hours as needed (mild  pain). 02/05/13   Loanne Drilling, MD    Diet: Regular diet Activity:WBAT No bending hip over 90 degrees- A "L" Angle Do not cross legs Do not let foot roll inward When turning these patients a pillow should be placed between the patient's legs to prevent crossing. Patients should have the affected knee fully extended when trying to sit or stand from all surfaces to prevent excessive hip flexion. When ambulating and turning toward the affected side the affected leg should have the  toes turned out prior to moving the walker and the rest of patient's body as to prevent internal rotation/ turning in of the leg. Abduction pillows are the most effective way to  prevent a patient from not crossing legs or turning toes in at rest. If an abduction pillow is not ordered placing a regular pillow length wise between the patient's legs is also an effective reminder. It is imperative that these precautions be maintained so that the surgical hip does not dislocate. Follow-up:in 2 weeks Disposition - Home Discharged Condition: good    Future Appointments Provider Department Dept Phone   05/22/2013 3:45 PM Tresa Garter, MD [facility] [phone]       Medication List    STOP taking these medications       HYDROcodone-acetaminophen 5-325 MG per tablet  Commonly known as:  NORCO/VICODIN      TAKE these medications       amLODipine 10 MG tablet  Commonly known as:  NORVASC  Take 10 mg by mouth every morning.     methocarbamol 500 MG tablet  Commonly known as:  ROBAXIN  Take 1 tablet (500 mg total) by mouth every 6 (six) hours as needed.     olmesartan-hydrochlorothiazide 40-25 MG per tablet  Commonly known as:  BENICAR HCT  Take 1 tablet by mouth every morning.     oxyCODONE 5 MG immediate release tablet  Commonly known as:  Oxy IR/ROXICODONE  Take 1-4 tablets (5-20 mg total) by mouth every 3 (three) hours as needed.     rivaroxaban 10 MG Tabs tablet  Commonly known as:  XARELTO  Take 1 tablet (10 mg total) by mouth daily with breakfast.     traMADol 50 MG tablet  Commonly known as:  ULTRAM  Take 1-2 tablets (50-100 mg total) by mouth every 6 (six) hours as needed (mild  pain).           Follow-up Information   Follow up with Loanne Drilling, MD. Schedule an appointment as soon as possible for a visit on 02/17/2013. (Call [phone] tomorrow or Monday to make the appointment)    Specialty:  Orthopedic Surgery   Contact information:   [address] [place] [address] [phone]       Signed: Loanne Drilling 02/08/2013, 10:03 AM

## 2013-05-22 ENCOUNTER — Ambulatory Visit: Payer: 59 | Admitting: Internal Medicine

## 2013-06-01 ENCOUNTER — Other Ambulatory Visit (INDEPENDENT_AMBULATORY_CARE_PROVIDER_SITE_OTHER): Payer: 59

## 2013-06-01 ENCOUNTER — Other Ambulatory Visit: Payer: Self-pay | Admitting: Internal Medicine

## 2013-06-01 ENCOUNTER — Encounter: Payer: Self-pay | Admitting: Internal Medicine

## 2013-06-01 ENCOUNTER — Ambulatory Visit (INDEPENDENT_AMBULATORY_CARE_PROVIDER_SITE_OTHER): Payer: 59 | Admitting: Internal Medicine

## 2013-06-01 VITALS — BP 120/80 | HR 80 | Temp 97.0°F | Resp 16 | Wt 334.0 lb

## 2013-06-01 DIAGNOSIS — R7309 Other abnormal glucose: Secondary | ICD-10-CM

## 2013-06-01 DIAGNOSIS — M169 Osteoarthritis of hip, unspecified: Secondary | ICD-10-CM

## 2013-06-01 DIAGNOSIS — E669 Obesity, unspecified: Secondary | ICD-10-CM

## 2013-06-01 DIAGNOSIS — G5622 Lesion of ulnar nerve, left upper limb: Secondary | ICD-10-CM

## 2013-06-01 DIAGNOSIS — R739 Hyperglycemia, unspecified: Secondary | ICD-10-CM

## 2013-06-01 DIAGNOSIS — G5620 Lesion of ulnar nerve, unspecified upper limb: Secondary | ICD-10-CM

## 2013-06-01 DIAGNOSIS — L219 Seborrheic dermatitis, unspecified: Secondary | ICD-10-CM

## 2013-06-01 DIAGNOSIS — M25552 Pain in left hip: Secondary | ICD-10-CM

## 2013-06-01 DIAGNOSIS — M25559 Pain in unspecified hip: Secondary | ICD-10-CM

## 2013-06-01 DIAGNOSIS — I10 Essential (primary) hypertension: Secondary | ICD-10-CM

## 2013-06-01 LAB — BASIC METABOLIC PANEL
BUN: 12 mg/dL (ref 6–23)
Calcium: 9.4 mg/dL (ref 8.4–10.5)
Chloride: 100 mEq/L (ref 96–112)
Creatinine, Ser: 0.8 mg/dL (ref 0.4–1.5)

## 2013-06-01 LAB — HEMOGLOBIN A1C: Hgb A1c MFr Bld: 7.3 % — ABNORMAL HIGH (ref 4.6–6.5)

## 2013-06-01 MED ORDER — KETOCONAZOLE 2 % EX CREA: 1.0000 "application " | TOPICAL_CREAM | Freq: Two times a day (BID) | CUTANEOUS | Status: DC

## 2013-06-01 MED ORDER — FUROSEMIDE 40 MG PO TABS: 40.0000 mg | ORAL_TABLET | Freq: Every day | ORAL | Status: DC | PRN

## 2013-06-01 MED ORDER — METFORMIN HCL 500 MG PO TABS: 500.0000 mg | ORAL_TABLET | Freq: Every day | ORAL | Status: DC

## 2013-06-01 MED ORDER — POTASSIUM CHLORIDE ER 10 MEQ PO TBCR: 10.0000 meq | EXTENDED_RELEASE_TABLET | Freq: Every day | ORAL | Status: DC

## 2013-06-01 MED ORDER — OLMESARTAN MEDOXOMIL-HCTZ 40-25 MG PO TABS: 1.0000 | ORAL_TABLET | Freq: Every morning | ORAL | Status: DC

## 2013-06-01 NOTE — Assessment & Plan Note (Signed)
Better  

## 2013-06-01 NOTE — Assessment & Plan Note (Signed)
Continue with current prescription therapy as reflected on the Med list.  

## 2013-06-01 NOTE — Assessment & Plan Note (Signed)
8/14 s/p R THR Dr Lequita Halt

## 2013-06-01 NOTE — Assessment & Plan Note (Signed)
Lab Wt loss

## 2013-06-01 NOTE — Assessment & Plan Note (Signed)
Ketoconazole cream bid

## 2013-06-01 NOTE — Progress Notes (Signed)
Pre visit review using our clinic review tool, if applicable. No additional management support is needed unless otherwise documented below in the visit note. 

## 2013-06-01 NOTE — Progress Notes (Signed)
Subjective:     HPI  F/u severe R hip pain - resolved  The patient has chronic hypertension that has been well controlled with medicines after wt loss; to address chronic  hyperlipidemia controlled with medicines as well; and to address type 2 pre diabetes, ED, obesity - all stable  He lost wt -  eating less  Past Medical History  Diagnosis Date  . HTN (hypertension)   . Obesity   . History of pyelonephritis   . Arthritis     OA BOTH HIPS - PAIN IN LEFT HIP WORSE   Past Surgical History  Procedure Laterality Date  . Right ulnar nerve surgery to remove scar tissue  ? 2006  . Buninectomy - right  1977  . Wisdom teeth extractions  ? 1983  . Total hip arthroplasty Left 02/04/2013    Procedure: LEFT TOTAL HIP ARTHROPLASTY;  Surgeon: Loanne Drilling, MD;  Location: [facility];  Service: Orthopedics;  Laterality: Left;    reports that he has never smoked. He does not have any smokeless tobacco history on file. He reports that he does not drink alcohol or use illicit drugs. family history includes Hypertension in his father and other. No Known Allergies Current Outpatient Prescriptions on File Prior to Visit  Medication Sig Dispense Refill  . amLODipine (NORVASC) 10 MG tablet Take 10 mg by mouth every morning.       . methocarbamol (ROBAXIN) 500 MG tablet Take 1 tablet (500 mg total) by mouth every 6 (six) hours as needed.  80 tablet  1  . olmesartan-hydrochlorothiazide (BENICAR HCT) 40-25 MG per tablet Take 1 tablet by mouth every morning.       Marland Kitchen oxyCODONE (OXY IR/ROXICODONE) 5 MG immediate release tablet Take 1-4 tablets (5-20 mg total) by mouth every 3 (three) hours as needed.  90 tablet  0  . rivaroxaban (XARELTO) 10 MG TABS tablet Take 1 tablet (10 mg total) by mouth daily with breakfast.  20 tablet  0  . traMADol (ULTRAM) 50 MG tablet Take 1-2 tablets (50-100 mg total) by mouth every 6 (six) hours as needed (mild  pain).  90 tablet  1   No current facility-administered  medications on file prior to visit.     Review of Systems  Constitutional: Positive for unexpected weight change. Negative for appetite change and fatigue.  HENT: Negative for congestion, nosebleeds, sneezing, sore throat and trouble swallowing.   Eyes: Negative for itching and visual disturbance.  Respiratory: Negative for cough.   Cardiovascular: Negative for chest pain, palpitations and leg swelling.  Gastrointestinal: Negative for nausea, diarrhea, blood in stool and abdominal distention.  Genitourinary: Negative for frequency and hematuria.  Musculoskeletal: Positive for arthralgias and gait problem. Negative for back pain, joint swelling and neck pain.  Skin: Negative for rash.  Neurological: Negative for dizziness, tremors, speech difficulty and weakness.  Psychiatric/Behavioral: Negative for sleep disturbance, dysphoric mood and agitation. The patient is not nervous/anxious.   B shins with no edema  BP Readings from Last 3 Encounters:  06/01/13 120/80  02/05/13 116/72  02/05/13 116/72   Wt Readings from Last 3 Encounters:  06/01/13 334 lb (151.501 kg)  02/04/13 329 lb (149.233 kg)  02/04/13 329 lb (149.233 kg)   BP 120/80  Pulse 80  Temp(Src) 97 F (36.1 C) (Oral)  Resp 16  Wt 334 lb (151.501 kg)      Objective:   Physical Exam  Constitutional: He is oriented to person, place, and time. He appears  well-developed.  Obese  HENT:  Mouth/Throat: Oropharynx is clear and moist.  Eyes: Conjunctivae are normal. Pupils are equal, round, and reactive to light.  Neck: Normal range of motion. No JVD present. No thyromegaly present.  Cardiovascular: Normal rate, regular rhythm, normal heart sounds and intact distal pulses.  Exam reveals no gallop and no friction rub.   No murmur heard. Pulmonary/Chest: Effort normal and breath sounds normal. No respiratory distress. He has no wheezes. He has no rales. He exhibits no tenderness.  Abdominal: Soft. Bowel sounds are normal. He  exhibits no distension and no mass. There is no tenderness. There is no rebound and no guarding.  Musculoskeletal: Normal range of motion. He exhibits tenderness. He exhibits no edema.  Limp, using a crutch  Lymphadenopathy:    He has no cervical adenopathy.  Neurological: He is alert and oriented to person, place, and time. He has normal reflexes. No cranial nerve deficit. He exhibits normal muscle tone. Coordination normal.  Skin: Skin is warm and dry. No rash noted.  Psychiatric: He has a normal mood and affect. His behavior is normal. Judgment and thought content normal.  R hip pain is better   Lab Results  Component Value Date   WBC 12.3* 02/05/2013   HGB 13.2 02/05/2013   HCT 37.6* 02/05/2013   PLT 173 02/05/2013   GLUCOSE 211* 02/05/2013   CHOL 175 05/02/2012   TRIG 235.0* 05/02/2012   HDL 32.30* 05/02/2012   LDLDIRECT 103.2 05/02/2012   LDLCALC 85 03/02/2010   ALT 18 01/28/2013   AST 17 01/28/2013   NA 135 02/05/2013   K 4.1 02/05/2013   CL 103 02/05/2013   CREATININE 0.72 02/05/2013   BUN 17 02/05/2013   CO2 22 02/05/2013   TSH 2.23 05/02/2012   PSA 0.76 05/02/2012   INR 1.07 01/28/2013        Assessment & Plan:

## 2013-06-05 ENCOUNTER — Telehealth: Payer: Self-pay

## 2013-06-05 NOTE — Telephone Encounter (Signed)
please, inform patient that all labs were normal except for a low potassium and a mild diabetes (worse). We need to start potassium pills and start Metformin  To improve his metabolism. RTC 4 mo with BMET and A1c Thx

## 2013-06-05 NOTE — Telephone Encounter (Signed)
I called patient back and informed him of MD message

## 2013-06-05 NOTE — Telephone Encounter (Signed)
Phone call from patient stating when he went to pick up his prescriptions at the pharmacy he was given Metformin for diabetes. He 's not sure why. He states when he received a call regarding his labs the only abnormality was his calcium. He asks why does he need the metformin? Please advise.

## 2013-09-25 ENCOUNTER — Other Ambulatory Visit (INDEPENDENT_AMBULATORY_CARE_PROVIDER_SITE_OTHER): Payer: 59

## 2013-09-25 ENCOUNTER — Ambulatory Visit (INDEPENDENT_AMBULATORY_CARE_PROVIDER_SITE_OTHER): Payer: 59 | Admitting: Internal Medicine

## 2013-09-25 ENCOUNTER — Encounter: Payer: Self-pay | Admitting: Internal Medicine

## 2013-09-25 VITALS — BP 110/74 | HR 81 | Temp 97.4°F | Ht 75.0 in | Wt 329.0 lb

## 2013-09-25 DIAGNOSIS — M25559 Pain in unspecified hip: Secondary | ICD-10-CM

## 2013-09-25 DIAGNOSIS — E669 Obesity, unspecified: Secondary | ICD-10-CM

## 2013-09-25 DIAGNOSIS — I10 Essential (primary) hypertension: Secondary | ICD-10-CM

## 2013-09-25 DIAGNOSIS — R739 Hyperglycemia, unspecified: Secondary | ICD-10-CM

## 2013-09-25 DIAGNOSIS — R7309 Other abnormal glucose: Secondary | ICD-10-CM

## 2013-09-25 DIAGNOSIS — Z0000 Encounter for general adult medical examination without abnormal findings: Secondary | ICD-10-CM

## 2013-09-25 LAB — BASIC METABOLIC PANEL
BUN: 15 mg/dL (ref 6–23)
CO2: 29 meq/L (ref 19–32)
Calcium: 9.5 mg/dL (ref 8.4–10.5)
Chloride: 101 mEq/L (ref 96–112)
Creatinine, Ser: 0.8 mg/dL (ref 0.4–1.5)
GFR: 101.91 mL/min (ref >60.00)
GLUCOSE: 107 mg/dL — AB (ref 70–99)
POTASSIUM: 3.7 meq/L (ref 3.5–5.1)
SODIUM: 137 meq/L (ref 135–145)

## 2013-09-25 LAB — HEMOGLOBIN A1C: Hgb A1c MFr Bld: 6.6 % — ABNORMAL HIGH (ref 4.6–6.5)

## 2013-09-25 MED ORDER — FUROSEMIDE 40 MG PO TABS: 40.0000 mg | ORAL_TABLET | Freq: Every day | ORAL | Status: DC | PRN

## 2013-09-25 NOTE — Assessment & Plan Note (Signed)
Labs  Continue with current prescription therapy as reflected on the Med list.  

## 2013-09-25 NOTE — Progress Notes (Signed)
Subjective:     HPI  F/u severe R hip pain - resolved C/o B quads soreness  The patient has chronic hypertension that has been well controlled with medicines after wt loss; to address chronic  hyperlipidemia controlled with medicines as well; and to address type 2 pre diabetes, ED, obesity - all stable  He lost wt -  eating less  Past Medical History  Diagnosis Date  . HTN (hypertension)   . Obesity   . History of pyelonephritis   . Arthritis     OA BOTH HIPS - PAIN IN LEFT HIP WORSE   Past Surgical History  Procedure Laterality Date  . Right ulnar nerve surgery to remove scar tissue  ? 2006  . Buninectomy - right  1977  . Wisdom teeth extractions  ? 1983  . Total hip arthroplasty Left 02/04/2013    Procedure: LEFT TOTAL HIP ARTHROPLASTY;  Surgeon: [name], MD;  Location: [facility];  Service: Orthopedics;  Laterality: Left;    reports that he has never smoked. He does not have any smokeless tobacco history on file. He reports that he does not drink alcohol or use illicit drugs. family history includes Hypertension in his father and other. No Known Allergies Current Outpatient Prescriptions on File Prior to Visit  Medication Sig Dispense Refill  . furosemide (LASIX) 40 MG tablet Take 1 tablet (40 mg total) by mouth daily as needed for edema.  30 tablet  3  . metFORMIN (GLUCOPHAGE) 500 MG tablet Take 1 tablet (500 mg total) by mouth daily with breakfast.  90 tablet  3  . olmesartan-hydrochlorothiazide (BENICAR HCT) 40-25 MG per tablet Take 1 tablet by mouth every morning.  90 tablet  3  . potassium chloride (KLOR-CON 10) 10 MEQ tablet Take 1 tablet (10 mEq total) by mouth daily.  90 tablet  3  . amLODipine (NORVASC) 10 MG tablet Take 10 mg by mouth every morning.       Marland Kitchen ketoconazole (NIZORAL) 2 % cream Apply 1 application topically 2 (two) times daily.  30 g  1  . rivaroxaban (XARELTO) 10 MG TABS tablet Take 1 tablet (10 mg total) by mouth daily with breakfast.  20  tablet  0   No current facility-administered medications on file prior to visit.     Review of Systems  Constitutional: Positive for unexpected weight change. Negative for appetite change and fatigue.  HENT: Negative for congestion, nosebleeds, sneezing, sore throat and trouble swallowing.   Eyes: Negative for itching and visual disturbance.  Respiratory: Negative for cough.   Cardiovascular: Negative for chest pain, palpitations and leg swelling.  Gastrointestinal: Negative for nausea, diarrhea, blood in stool and abdominal distention.  Genitourinary: Negative for frequency and hematuria.  Musculoskeletal: Positive for arthralgias and gait problem. Negative for back pain, joint swelling and neck pain.  Skin: Negative for rash.  Neurological: Negative for dizziness, tremors, speech difficulty and weakness.  Psychiatric/Behavioral: Negative for sleep disturbance, dysphoric mood and agitation. The patient is not nervous/anxious.   B shins with no edema  BP Readings from Last 3 Encounters:  09/25/13 110/74  06/01/13 120/80  02/05/13 116/72   Wt Readings from Last 3 Encounters:  09/25/13 329 lb (149.233 kg)  06/01/13 334 lb (151.501 kg)  02/04/13 329 lb (149.233 kg)   BP 110/74  Pulse 81  Temp(Src) 97.4 F (36.3 C) (Oral)  Ht 6\' 3"  (1.905 m)  Wt 329 lb (149.233 kg)  BMI 41.12 kg/m2  SpO2 97%  Objective:   Physical Exam  Constitutional: He is oriented to person, place, and time. He appears well-developed.  Obese  HENT:  Mouth/Throat: Oropharynx is clear and moist.  Eyes: Conjunctivae are normal. Pupils are equal, round, and reactive to light.  Neck: Normal range of motion. No JVD present. No thyromegaly present.  Cardiovascular: Normal rate, regular rhythm, normal heart sounds and intact distal pulses.  Exam reveals no gallop and no friction rub.   No murmur heard. Pulmonary/Chest: Effort normal and breath sounds normal. No respiratory distress. He has no wheezes. He  has no rales. He exhibits no tenderness.  Abdominal: Soft. Bowel sounds are normal. He exhibits no distension and no mass. There is no tenderness. There is no rebound and no guarding.  Musculoskeletal: Normal range of motion. He exhibits tenderness. He exhibits no edema.  Limp, using a crutch  Lymphadenopathy:    He has no cervical adenopathy.  Neurological: He is alert and oriented to person, place, and time. He has normal reflexes. No cranial nerve deficit. He exhibits normal muscle tone. Coordination normal.  Skin: Skin is warm and dry. No rash noted.  Psychiatric: He has a normal mood and affect. His behavior is normal. Judgment and thought content normal.  R hip pain is better   Lab Results  Component Value Date   WBC 12.3* 02/05/2013   HGB 13.2 02/05/2013   HCT 37.6* 02/05/2013   PLT 173 02/05/2013   GLUCOSE 125* 06/01/2013   CHOL 175 05/02/2012   TRIG 235.0* 05/02/2012   HDL 32.30* 05/02/2012   LDLDIRECT 103.2 05/02/2012   LDLCALC 85 03/02/2010   ALT 18 01/28/2013   AST 17 01/28/2013   NA 136 06/01/2013   K 3.4* 06/01/2013   CL 100 06/01/2013   CREATININE 0.8 06/01/2013   BUN 12 06/01/2013   CO2 26 06/01/2013   TSH 2.23 05/02/2012   PSA 0.76 05/02/2012   INR 1.07 01/28/2013   HGBA1C 7.3* 06/01/2013        Assessment & Plan:

## 2013-09-25 NOTE — Patient Instructions (Signed)
Wt Readings from Last 3 Encounters:  09/25/13 329 lb (149.233 kg)  06/01/13 334 lb (151.501 kg)  02/04/13 329 lb (149.233 kg)   [email or web address]  -- look up "IT band stretch"

## 2013-09-25 NOTE — Assessment & Plan Note (Signed)
Continue with current prescription therapy as reflected on the Med list.  

## 2013-09-25 NOTE — Assessment & Plan Note (Signed)
IT band stretches

## 2013-09-25 NOTE — Assessment & Plan Note (Signed)
Better  

## 2013-09-25 NOTE — Progress Notes (Signed)
Pre visit review using our clinic review tool, if applicable. No additional management support is needed unless otherwise documented below in the visit note. 

## 2013-11-27 ENCOUNTER — Ambulatory Visit: Payer: 59 | Admitting: Internal Medicine

## 2013-12-07 ENCOUNTER — Other Ambulatory Visit: Payer: Self-pay | Admitting: Internal Medicine

## 2013-12-25 ENCOUNTER — Other Ambulatory Visit: Payer: Self-pay | Admitting: Internal Medicine

## 2014-02-25 ENCOUNTER — Encounter: Payer: Self-pay | Admitting: Internal Medicine

## 2014-02-25 ENCOUNTER — Ambulatory Visit (INDEPENDENT_AMBULATORY_CARE_PROVIDER_SITE_OTHER): Payer: 59 | Admitting: Internal Medicine

## 2014-02-25 VITALS — BP 132/72 | HR 82 | Temp 98.2°F | Resp 20 | Ht 75.0 in | Wt 342.4 lb

## 2014-02-25 DIAGNOSIS — L02419 Cutaneous abscess of limb, unspecified: Secondary | ICD-10-CM

## 2014-02-25 DIAGNOSIS — L03116 Cellulitis of left lower limb: Secondary | ICD-10-CM

## 2014-02-25 DIAGNOSIS — L03119 Cellulitis of unspecified part of limb: Secondary | ICD-10-CM

## 2014-02-25 MED ORDER — CEPHALEXIN 500 MG PO CAPS: 500.0000 mg | ORAL_CAPSULE | Freq: Four times a day (QID) | ORAL | Status: DC

## 2014-02-25 NOTE — Patient Instructions (Signed)
We think that your leg has cellulitis or a skin infection. We will give you an antiboitic called keflex (cephalexin) which you need to take 4 times a day for 1 week.   Cellulitis Cellulitis is an infection of the skin and the tissue beneath it. The infected area is usually red and tender. Cellulitis occurs most often in the arms and lower legs.  CAUSES  Cellulitis is caused by bacteria that enter the skin through cracks or cuts in the skin. The most common types of bacteria that cause cellulitis are staphylococci and streptococci. SIGNS AND SYMPTOMS   Redness and warmth.  Swelling.  Tenderness or pain.  Fever. DIAGNOSIS  Your health care provider can usually determine what is wrong based on a physical exam. Blood tests may also be done. TREATMENT  Treatment usually involves taking an antibiotic medicine. HOME CARE INSTRUCTIONS   Take your antibiotic medicine as directed by your health care provider. Finish the antibiotic even if you start to feel better.  Keep the infected arm or leg elevated to reduce swelling.  Apply a warm cloth to the affected area up to 4 times per day to relieve pain.  Take medicines only as directed by your health care provider.  Keep all follow-up visits as directed by your health care provider. SEEK MEDICAL CARE IF:   You notice red streaks coming from the infected area.  Your red area gets larger or turns dark in color.  Your bone or joint underneath the infected area becomes painful after the skin has healed.  Your infection returns in the same area or another area.  You notice a swollen bump in the infected area.  You develop new symptoms.  You have a fever. SEEK IMMEDIATE MEDICAL CARE IF:   You feel very sleepy.  You develop vomiting or diarrhea.  You have a general ill feeling (malaise) with muscle aches and pains. MAKE SURE YOU:   Understand these instructions.  Will watch your condition.  Will get help right away if you are  not doing well or get worse. Document Released: 03/14/2005 Document Revised: 10/19/2013 Document Reviewed: 08/20/2011 [facility] Patient Information 2015 [place], [place]. This information is not intended to replace advice given to you by your health care provider. Make sure you discuss any questions you have with your health care provider.

## 2014-02-25 NOTE — Progress Notes (Signed)
Pre visit review using our clinic review tool, if applicable. No additional management support is needed unless otherwise documented below in the visit note. 

## 2014-02-26 DIAGNOSIS — L03116 Cellulitis of left lower limb: Secondary | ICD-10-CM | POA: Insufficient documentation

## 2014-02-26 NOTE — Progress Notes (Signed)
   Subjective:    Patient ID: Joel Medina, male    DOB: 02-26-1961, 53 y.o.   MRN: [id number]  HPI The patient is a 53 YO man with PMH of HTN, swelling in his legs, OA in his hips who is coming in today for some redness and swelling on his left shin. He is supposed to take lasix daily but this causing a lot of inconvenience at work with needing to urinate about every 15-30 minutes. He noticed that both legs have been more swollen since that time. He noticed 1-2 nights ago that one leg was a little tender and then noticed today that it was starting to get red. He denies any fevers or chills at home. He denies any injury to his leg although his wife was clipping his toenails on the left foot the day before this started. He has no other complaints.   Review of Systems  Constitutional: Negative for fever, chills, activity change, appetite change and fatigue.  Respiratory: Negative for cough, chest tightness, shortness of breath and wheezing.   Cardiovascular: Positive for leg swelling. Negative for chest pain and palpitations.  Skin: Positive for color change and rash. Negative for wound.  Allergic/Immunologic: Positive for environmental allergies.      Objective:   Physical Exam  Constitutional: He appears well-developed and well-nourished.  Obese  HENT:  Head: Normocephalic and atraumatic.  Eyes: EOM are normal.  Cardiovascular: Normal rate and regular rhythm.   Pulmonary/Chest: Effort normal and breath sounds normal. No respiratory distress.  Musculoskeletal: Normal range of motion. He exhibits edema and tenderness.  Minimal tenderness with slight heat over rash, no muscular pain in the leg.   Skin: Skin is warm and dry. Rash noted. There is erythema.  Erythema on the left shin to midway to knee. Does not extend to the back of the leg. No calf tenderness. Some edema to midshin on the left leg. Also some edema to the midshin on the right leg. Both 1+ pitting.       Assessment & Plan:

## 2014-02-26 NOTE — Assessment & Plan Note (Signed)
Mild cellulitis on the left leg, no concern for DVT given no calf tenderness and on wrong location. Spoke with him about taking the lasix to help with swelling long term or alternative of compression stockings after his cellulitis is resolved. He will take keflex QID for 1 week and call back with worsening, fevers, pain, more swelling.

## 2014-04-02 ENCOUNTER — Other Ambulatory Visit (INDEPENDENT_AMBULATORY_CARE_PROVIDER_SITE_OTHER): Payer: 59

## 2014-04-02 ENCOUNTER — Ambulatory Visit (INDEPENDENT_AMBULATORY_CARE_PROVIDER_SITE_OTHER): Payer: 59 | Admitting: Internal Medicine

## 2014-04-02 ENCOUNTER — Encounter: Payer: Self-pay | Admitting: Internal Medicine

## 2014-04-02 VITALS — BP 110/80 | HR 83 | Temp 98.2°F | Ht 75.0 in | Wt 339.0 lb

## 2014-04-02 DIAGNOSIS — Z0000 Encounter for general adult medical examination without abnormal findings: Secondary | ICD-10-CM

## 2014-04-02 DIAGNOSIS — R739 Hyperglycemia, unspecified: Secondary | ICD-10-CM

## 2014-04-02 DIAGNOSIS — I10 Essential (primary) hypertension: Secondary | ICD-10-CM

## 2014-04-02 LAB — BASIC METABOLIC PANEL
BUN: 13 mg/dL (ref 6–23)
CHLORIDE: 101 meq/L (ref 96–112)
CO2: 25 meq/L (ref 19–32)
CREATININE: 0.8 mg/dL (ref 0.4–1.5)
Calcium: 9.4 mg/dL (ref 8.4–10.5)
GFR: 104.57 mL/min (ref >60.00)
Glucose, Bld: 152 mg/dL — ABNORMAL HIGH (ref 70–99)
Potassium: 3.5 mEq/L (ref 3.5–5.1)
Sodium: 135 mEq/L (ref 135–145)

## 2014-04-02 LAB — HEPATIC FUNCTION PANEL
ALK PHOS: 67 U/L (ref 39–117)
ALT: 29 U/L (ref 0–53)
AST: 26 U/L (ref 0–37)
Albumin: 3.8 g/dL (ref 3.5–5.2)
BILIRUBIN DIRECT: 0.2 mg/dL (ref 0.0–0.3)
BILIRUBIN TOTAL: 1.3 mg/dL — AB (ref 0.2–1.2)
Total Protein: 7.9 g/dL (ref 6.0–8.3)

## 2014-04-02 LAB — CBC WITH DIFFERENTIAL/PLATELET
BASOS PCT: 0.6 % (ref 0.0–3.0)
Basophils Absolute: 0 10*3/uL (ref 0.0–0.1)
EOS PCT: 1.2 % (ref 0.0–5.0)
Eosinophils Absolute: 0.1 10*3/uL (ref 0.0–0.7)
HEMATOCRIT: 44.6 % (ref 39.0–52.0)
HEMOGLOBIN: 15.2 g/dL (ref 13.0–17.0)
LYMPHS ABS: 3.1 10*3/uL (ref 0.7–4.0)
Lymphocytes Relative: 37.5 % (ref 12.0–46.0)
MCHC: 34 g/dL (ref 30.0–36.0)
MCV: 94.6 fl (ref 78.0–100.0)
MONO ABS: 0.5 10*3/uL (ref 0.1–1.0)
Monocytes Relative: 6.4 % (ref 3.0–12.0)
NEUTROS ABS: 4.5 10*3/uL (ref 1.4–7.7)
Neutrophils Relative %: 54.3 % (ref 43.0–77.0)
PLATELETS: 225 10*3/uL (ref 150.0–400.0)
RBC: 4.71 Mil/uL (ref 4.22–5.81)
RDW: 13.8 % (ref 11.5–15.5)
WBC: 8.3 10*3/uL (ref 4.0–10.5)

## 2014-04-02 LAB — PSA: PSA: 0.82 ng/mL (ref 0.10–4.00)

## 2014-04-02 LAB — LIPID PANEL
CHOL/HDL RATIO: 6
CHOLESTEROL: 159 mg/dL (ref 0–200)
HDL: 24.8 mg/dL — ABNORMAL LOW (ref >39.00)
NONHDL: 134.2
Triglycerides: 210 mg/dL — ABNORMAL HIGH (ref 0.0–149.0)
VLDL: 42 mg/dL — ABNORMAL HIGH (ref 0.0–40.0)

## 2014-04-02 LAB — TSH: TSH: 3.52 u[IU]/mL (ref 0.35–4.50)

## 2014-04-02 LAB — HEMOGLOBIN A1C: Hgb A1c MFr Bld: 7.8 % — ABNORMAL HIGH (ref 4.6–6.5)

## 2014-04-02 MED ORDER — KETOCONAZOLE 2 % EX CREA: 1.0000 "application " | TOPICAL_CREAM | Freq: Two times a day (BID) | CUTANEOUS | Status: DC

## 2014-04-02 NOTE — Assessment & Plan Note (Signed)
Continue with current prescription therapy as reflected on the Med list.  

## 2014-04-02 NOTE — Progress Notes (Signed)
Subjective:     HPI  F/u severe R hip pain - resolved   The patient has chronic hypertension that has been well controlled with medicines after wt loss; to address chronic  hyperlipidemia controlled with medicines as well; and to address type 2 pre diabetes, ED, obesity - all stable  He lost wt -  eating less  Past Medical History  Diagnosis Date  . HTN (hypertension)   . Obesity   . History of pyelonephritis   . Arthritis     OA BOTH HIPS - PAIN IN LEFT HIP WORSE   Past Surgical History  Procedure Laterality Date  . Right ulnar nerve surgery to remove scar tissue  ? 2006  . Buninectomy - right  1977  . Wisdom teeth extractions  ? 1983  . Total hip arthroplasty Left 02/04/2013    Procedure: LEFT TOTAL HIP ARTHROPLASTY;  Surgeon: [name], MD;  Location: [facility];  Service: Orthopedics;  Laterality: Left;    reports that he has never smoked. He does not have any smokeless tobacco history on file. He reports that he does not drink alcohol or use illicit drugs. family history includes Hypertension in his father and other. No Known Allergies Current Outpatient Prescriptions on File Prior to Visit  Medication Sig Dispense Refill  . amLODipine (NORVASC) 10 MG tablet TAKE 1 TABLET BY MOUTH EVERY DAY  90 tablet  2  . furosemide (LASIX) 40 MG tablet Take 1 tablet (40 mg total) by mouth daily as needed for edema.  30 tablet  3  . ketoconazole (NIZORAL) 2 % cream Apply 1 application topically 2 (two) times daily.  30 g  1  . metFORMIN (GLUCOPHAGE) 500 MG tablet Take 1 tablet (500 mg total) by mouth daily with breakfast.  90 tablet  3  . olmesartan-hydrochlorothiazide (BENICAR HCT) 40-25 MG per tablet Take 1 tablet by mouth every morning.  90 tablet  3  . potassium chloride (KLOR-CON 10) 10 MEQ tablet Take 1 tablet (10 mEq total) by mouth daily.  90 tablet  3  . rivaroxaban (XARELTO) 10 MG TABS tablet Take 1 tablet (10 mg total) by mouth daily with breakfast.  20 tablet  0   No  current facility-administered medications on file prior to visit.     Review of Systems  Constitutional: Positive for unexpected weight change. Negative for appetite change and fatigue.  HENT: Negative for congestion, nosebleeds, sneezing, sore throat and trouble swallowing.   Eyes: Negative for itching and visual disturbance.  Respiratory: Negative for cough.   Cardiovascular: Negative for chest pain, palpitations and leg swelling.  Gastrointestinal: Negative for nausea, diarrhea, blood in stool and abdominal distention.  Genitourinary: Negative for frequency and hematuria.  Musculoskeletal: Positive for arthralgias and gait problem. Negative for back pain, joint swelling and neck pain.  Skin: Negative for rash.  Neurological: Negative for dizziness, tremors, speech difficulty and weakness.  Psychiatric/Behavioral: Negative for sleep disturbance, dysphoric mood and agitation. The patient is not nervous/anxious.   B shins with no edema  BP Readings from Last 3 Encounters:  04/02/14 110/80  02/25/14 132/72  09/25/13 110/74   Wt Readings from Last 3 Encounters:  04/02/14 339 lb (153.769 kg)  02/25/14 342 lb 6.4 oz (155.312 kg)  09/25/13 329 lb (149.233 kg)   BP 110/80  Pulse 83  Temp(Src) 98.2 F (36.8 C) (Oral)  Wt 339 lb (153.769 kg)  SpO2 95%      Objective:   Physical Exam  Constitutional:  He is oriented to person, place, and time. He appears well-developed.  Obese  HENT:  Mouth/Throat: Oropharynx is clear and moist.  Eyes: Conjunctivae are normal. Pupils are equal, round, and reactive to light.  Neck: Normal range of motion. No JVD present. No thyromegaly present.  Cardiovascular: Normal rate, regular rhythm, normal heart sounds and intact distal pulses.  Exam reveals no gallop and no friction rub.   No murmur heard. Pulmonary/Chest: Effort normal and breath sounds normal. No respiratory distress. He has no wheezes. He has no rales. He exhibits no tenderness.   Abdominal: Soft. Bowel sounds are normal. He exhibits no distension and no mass. There is no tenderness. There is no rebound and no guarding.  Musculoskeletal: Normal range of motion. He exhibits tenderness. He exhibits no edema.  Limp, using a crutch  Lymphadenopathy:    He has no cervical adenopathy.  Neurological: He is alert and oriented to person, place, and time. He has normal reflexes. No cranial nerve deficit. He exhibits normal muscle tone. Coordination normal.  Skin: Skin is warm and dry. No rash noted.  Psychiatric: He has a normal mood and affect. His behavior is normal. Judgment and thought content normal.  R hip pain is ok   Lab Results  Component Value Date   WBC 12.3* 02/05/2013   HGB 13.2 02/05/2013   HCT 37.6* 02/05/2013   PLT 173 02/05/2013   GLUCOSE 107* 09/25/2013   CHOL 175 05/02/2012   TRIG 235.0* 05/02/2012   HDL 32.30* 05/02/2012   LDLDIRECT 103.2 05/02/2012   LDLCALC 85 03/02/2010   ALT 18 01/28/2013   AST 17 01/28/2013   NA 137 09/25/2013   K 3.7 09/25/2013   CL 101 09/25/2013   CREATININE 0.8 09/25/2013   BUN 15 09/25/2013   CO2 29 09/25/2013   TSH 2.23 05/02/2012   PSA 0.76 05/02/2012   INR 1.07 01/28/2013   HGBA1C 6.6* 09/25/2013        Assessment & Plan:

## 2014-04-02 NOTE — Progress Notes (Signed)
Pre visit review using our clinic review tool, if applicable. No additional management support is needed unless otherwise documented below in the visit note. 

## 2014-04-05 ENCOUNTER — Other Ambulatory Visit: Payer: Self-pay | Admitting: Internal Medicine

## 2014-04-05 ENCOUNTER — Telehealth: Payer: Self-pay | Admitting: Internal Medicine

## 2014-04-05 LAB — URINALYSIS
Bilirubin Urine: NEGATIVE
Hgb urine dipstick: NEGATIVE
Ketones, ur: NEGATIVE
Leukocytes, UA: NEGATIVE
NITRITE: NEGATIVE
PH: 5.5 (ref 5.0–8.0)
SPECIFIC GRAVITY, URINE: 1.025 (ref 1.000–1.030)
TOTAL PROTEIN, URINE-UPE24: NEGATIVE
URINE GLUCOSE: NEGATIVE
Urobilinogen, UA: 0.2 (ref 0.0–1.0)

## 2014-04-05 LAB — LDL CHOLESTEROL, DIRECT: LDL DIRECT: 93.3 mg/dL

## 2014-04-05 MED ORDER — METFORMIN HCL 1000 MG PO TABS: 1000.0000 mg | ORAL_TABLET | Freq: Two times a day (BID) | ORAL | Status: DC

## 2014-04-05 NOTE — Telephone Encounter (Signed)
emmi emailed °

## 2014-04-30 ENCOUNTER — Telehealth: Payer: Self-pay | Admitting: Internal Medicine

## 2014-04-30 NOTE — Telephone Encounter (Signed)
Patients wife believes he is having an allergic reaction to metformin.  Patient has developed hoarseness and a cough with chest red.  Did send to nurse line.  Please follow up.

## 2014-04-30 NOTE — Telephone Encounter (Signed)
Patient Information:  Caller Name: Amy  Phone: [phone]  Patient: Joel, Joel Medina  Gender: Male  DOB: February 20, 1961  Age: 53 Years  PCP: Plotnikov, Alex (Adults only)  Office Follow Up:  Does the office need to follow up with this patient?: Yes  Instructions For The Office: Please call and advise   Symptoms  Reason For Call & Symptoms: Pts spouse is calling on his behalf. Pt was increased to Metformin 2000mg  daily. After the visit he developed a cough/redness on his chest (not hives but appears as a sunburn the width of his chest and stops before his abdomen. He is also hoarse. Pts spouse said she looked up the symptoms and they were listed as side effects. Pt states he is fatigued and the cough is intermittent dry cough - it has not progressed.  Reviewed Health History In EMR: Yes  Reviewed Medications In EMR: Yes  Reviewed Allergies In EMR: Yes  Reviewed Surgeries / Procedures: Yes  Date of Onset of Symptoms: 04/19/2014  Guideline(s) Used:  Cough  Disposition Per Guideline:   Home Care  Reason For Disposition Reached:   Cough with no complications  Advice Given:  N/A  Patient Refused Recommendation:  Patient Refused Care Advice  Pts spouse does not want an appt. She is convinced that the dose of Metformin is too high and would like to talk with MD about this.

## 2014-05-03 NOTE — Telephone Encounter (Signed)
Pt's wife called regarding messages she left on 11/13 about possible allergic reaction to metformin. Stated she did speak with nurse but is still expecting a call from this office regarding this. Please call.

## 2014-05-03 NOTE — Telephone Encounter (Signed)
Hold Metformin Take Claritin 1 po qd Call in 1-2 wks w/update OV if problems Thx

## 2014-05-04 NOTE — Telephone Encounter (Signed)
Left mess for patient to call back.  

## 2014-05-05 ENCOUNTER — Telehealth: Payer: Self-pay | Admitting: Internal Medicine

## 2014-05-05 NOTE — Telephone Encounter (Signed)
error 

## 2014-05-05 NOTE — Telephone Encounter (Signed)
Patient returned phone call.  Please call back in regards.

## 2014-05-05 NOTE — Telephone Encounter (Signed)
Notified pt with md response.../lmb 

## 2014-05-05 NOTE — Telephone Encounter (Signed)
Pt is aware duplicate msg (see previous msg)...Johny Chess

## 2014-06-30 ENCOUNTER — Telehealth: Payer: Self-pay | Admitting: Internal Medicine

## 2014-06-30 NOTE — Telephone Encounter (Signed)
Patient wife called in advising that she was advised that Dr Doug Sou was OON on 02/25/2014. I verified that Dr. Doug Sou has never been OON with [facility]. Sending email to annabelle diaz to request assistance with follow up w/ [employer]. Will call her back once I receive a response @ [phone]

## 2014-07-22 ENCOUNTER — Other Ambulatory Visit: Payer: Self-pay | Admitting: Internal Medicine

## 2014-10-05 ENCOUNTER — Other Ambulatory Visit (INDEPENDENT_AMBULATORY_CARE_PROVIDER_SITE_OTHER): Payer: 59

## 2014-10-05 ENCOUNTER — Ambulatory Visit (INDEPENDENT_AMBULATORY_CARE_PROVIDER_SITE_OTHER): Payer: 59 | Admitting: Internal Medicine

## 2014-10-05 ENCOUNTER — Encounter: Payer: Self-pay | Admitting: Internal Medicine

## 2014-10-05 VITALS — BP 120/76 | HR 83 | Ht 75.0 in | Wt 344.0 lb

## 2014-10-05 DIAGNOSIS — I10 Essential (primary) hypertension: Secondary | ICD-10-CM | POA: Diagnosis not present

## 2014-10-05 DIAGNOSIS — N433 Hydrocele, unspecified: Secondary | ICD-10-CM | POA: Diagnosis not present

## 2014-10-05 DIAGNOSIS — Z0000 Encounter for general adult medical examination without abnormal findings: Secondary | ICD-10-CM

## 2014-10-05 DIAGNOSIS — M25559 Pain in unspecified hip: Secondary | ICD-10-CM

## 2014-10-05 DIAGNOSIS — Z1211 Encounter for screening for malignant neoplasm of colon: Secondary | ICD-10-CM

## 2014-10-05 DIAGNOSIS — R739 Hyperglycemia, unspecified: Secondary | ICD-10-CM

## 2014-10-05 DIAGNOSIS — E669 Obesity, unspecified: Secondary | ICD-10-CM

## 2014-10-05 LAB — URINALYSIS
Bilirubin Urine: NEGATIVE
Hgb urine dipstick: NEGATIVE
Ketones, ur: NEGATIVE
Leukocytes, UA: NEGATIVE
Nitrite: NEGATIVE
PH: 5.5 (ref 5.0–8.0)
SPECIFIC GRAVITY, URINE: 1.025 (ref 1.000–1.030)
TOTAL PROTEIN, URINE-UPE24: NEGATIVE
URINE GLUCOSE: NEGATIVE
Urobilinogen, UA: 0.2 (ref 0.0–1.0)

## 2014-10-05 LAB — BASIC METABOLIC PANEL
BUN: 16 mg/dL (ref 6–23)
CHLORIDE: 105 meq/L (ref 96–112)
CO2: 27 mEq/L (ref 19–32)
Calcium: 9.3 mg/dL (ref 8.4–10.5)
Creatinine, Ser: 0.79 mg/dL (ref 0.40–1.50)
GFR: 108.96 mL/min (ref >60.00)
Glucose, Bld: 168 mg/dL — ABNORMAL HIGH (ref 70–99)
POTASSIUM: 4 meq/L (ref 3.5–5.1)
Sodium: 138 mEq/L (ref 135–145)

## 2014-10-05 LAB — CBC WITH DIFFERENTIAL/PLATELET
BASOS ABS: 0 10*3/uL (ref 0.0–0.1)
Basophils Relative: 0.6 % (ref 0.0–3.0)
Eosinophils Absolute: 0.1 10*3/uL (ref 0.0–0.7)
Eosinophils Relative: 1.1 % (ref 0.0–5.0)
HEMATOCRIT: 43.9 % (ref 39.0–52.0)
HEMOGLOBIN: 15.5 g/dL (ref 13.0–17.0)
LYMPHS ABS: 2.4 10*3/uL (ref 0.7–4.0)
Lymphocytes Relative: 38.7 % (ref 12.0–46.0)
MCHC: 35.4 g/dL (ref 30.0–36.0)
MCV: 92.1 fl (ref 78.0–100.0)
MONO ABS: 0.4 10*3/uL (ref 0.1–1.0)
Monocytes Relative: 7.2 % (ref 3.0–12.0)
NEUTROS ABS: 3.2 10*3/uL (ref 1.4–7.7)
Neutrophils Relative %: 52.4 % (ref 43.0–77.0)
Platelets: 208 10*3/uL (ref 150.0–400.0)
RBC: 4.76 Mil/uL (ref 4.22–5.81)
RDW: 14.1 % (ref 11.5–15.5)
WBC: 6.2 10*3/uL (ref 4.0–10.5)

## 2014-10-05 LAB — LIPID PANEL
CHOLESTEROL: 140 mg/dL (ref 0–200)
HDL: 31 mg/dL — ABNORMAL LOW (ref >39.00)
LDL CALC: 78 mg/dL (ref 0–99)
NonHDL: 109
TRIGLYCERIDES: 155 mg/dL — AB (ref 0.0–149.0)
Total CHOL/HDL Ratio: 5
VLDL: 31 mg/dL (ref 0.0–40.0)

## 2014-10-05 LAB — PSA: PSA: 0.78 ng/mL (ref 0.10–4.00)

## 2014-10-05 LAB — HEPATIC FUNCTION PANEL
ALT: 20 U/L (ref 0–53)
AST: 16 U/L (ref 0–37)
Albumin: 4.1 g/dL (ref 3.5–5.2)
Alkaline Phosphatase: 66 U/L (ref 39–117)
Bilirubin, Direct: 0.1 mg/dL (ref 0.0–0.3)
Total Bilirubin: 0.6 mg/dL (ref 0.2–1.2)
Total Protein: 6.7 g/dL (ref 6.0–8.3)

## 2014-10-05 LAB — HEMOGLOBIN A1C: HEMOGLOBIN A1C: 7.2 % — AB (ref 4.6–6.5)

## 2014-10-05 LAB — TSH: TSH: 2.88 u[IU]/mL (ref 0.35–4.50)

## 2014-10-05 MED ORDER — OLMESARTAN MEDOXOMIL-HCTZ 40-25 MG PO TABS: 1.0000 | ORAL_TABLET | Freq: Every morning | ORAL | Status: DC

## 2014-10-05 MED ORDER — POTASSIUM CHLORIDE ER 10 MEQ PO TBCR: 10.0000 meq | EXTENDED_RELEASE_TABLET | Freq: Every day | ORAL | Status: DC

## 2014-10-05 MED ORDER — METFORMIN HCL 1000 MG PO TABS: 1000.0000 mg | ORAL_TABLET | Freq: Two times a day (BID) | ORAL | Status: DC

## 2014-10-05 MED ORDER — AMLODIPINE BESYLATE 10 MG PO TABS: 10.0000 mg | ORAL_TABLET | Freq: Every day | ORAL | Status: DC

## 2014-10-05 NOTE — Progress Notes (Signed)
Pre visit review using our clinic review tool, if applicable. No additional management support is needed unless otherwise documented below in the visit note. 

## 2014-10-05 NOTE — Assessment & Plan Note (Signed)
A1c

## 2014-10-05 NOTE — Assessment & Plan Note (Signed)
Dr Wynelle Link R hip OA - planning surgery this summer

## 2014-10-05 NOTE — Assessment & Plan Note (Signed)
We discussed age appropriate health related issues, including available/recomended screening tests and vaccinations. We discussed a need for adhering to healthy diet and exercise. Labs/EKG were reviewed/ordered. All questions were answered.   

## 2014-10-05 NOTE — Assessment & Plan Note (Signed)
Wt Readings from Last 3 Encounters:  10/05/14 344 lb (156.037 kg)  04/02/14 339 lb (153.769 kg)  02/25/14 342 lb 6.4 oz (155.312 kg)

## 2014-10-05 NOTE — Assessment & Plan Note (Signed)
Benicar HCT, Amlodipine 

## 2014-10-08 ENCOUNTER — Encounter: Payer: Self-pay | Admitting: Internal Medicine

## 2014-10-08 ENCOUNTER — Telehealth: Payer: Self-pay | Admitting: Internal Medicine

## 2014-10-08 MED ORDER — METFORMIN HCL 1000 MG PO TABS: 1000.0000 mg | ORAL_TABLET | Freq: Two times a day (BID) | ORAL | Status: DC

## 2014-10-08 NOTE — Telephone Encounter (Signed)
Resent to [facility]../l;mb 

## 2014-10-08 NOTE — Telephone Encounter (Signed)
Pt called in said that he pick up scripts and they did not have the metFORMIN (GLUCOPHAGE) 1000 MG tablet [[id number]].    He wanted to see if we could recall it in?

## 2014-10-10 NOTE — Progress Notes (Signed)
Subjective:     HPI  The patient is here for a wellness exam.   F/u severe R hip pain - resolved post-THR C/o L hip pain   The patient has chronic hypertension that has been well controlled with medicines after wt loss; to address chronic  hyperlipidemia controlled with medicines as well; and to address type 2 pre diabetes, ED, obesity - all stable  He lost wt -  eating less  Past Medical History  Diagnosis Date  . HTN (hypertension)   . Obesity   . History of pyelonephritis   . Arthritis     OA BOTH HIPS - PAIN IN LEFT HIP WORSE   Past Surgical History  Procedure Laterality Date  . Right ulnar nerve surgery to remove scar tissue  ? 2006  . Buninectomy - right  1977  . Wisdom teeth extractions  ? 1983  . Total hip arthroplasty Left 02/04/2013    Procedure: LEFT TOTAL HIP ARTHROPLASTY;  Surgeon: [name], MD;  Location: [facility];  Service: Orthopedics;  Laterality: Left;    reports that he has never smoked. He does not have any smokeless tobacco history on file. He reports that he does not drink alcohol or use illicit drugs. family history includes Hypertension in his father and other. No Known Allergies Current Outpatient Prescriptions on File Prior to Visit  Medication Sig Dispense Refill  . furosemide (LASIX) 40 MG tablet Take 1 tablet (40 mg total) by mouth daily as needed for edema. 30 tablet 3  . ketoconazole (NIZORAL) 2 % cream Apply 1 application topically 2 (two) times daily. 30 g 1   No current facility-administered medications on file prior to visit.     Review of Systems  Constitutional: Positive for unexpected weight change. Negative for appetite change and fatigue.  HENT: Negative for congestion, nosebleeds, sneezing, sore throat and trouble swallowing.   Eyes: Negative for itching and visual disturbance.  Respiratory: Negative for cough.   Cardiovascular: Negative for chest pain, palpitations and leg swelling.  Gastrointestinal: Negative for  nausea, diarrhea, blood in stool and abdominal distention.  Genitourinary: Negative for urgency, frequency, hematuria and decreased urine volume.  Musculoskeletal: Positive for arthralgias and gait problem. Negative for back pain, joint swelling and neck pain.  Skin: Negative for rash.  Neurological: Negative for dizziness, tremors, speech difficulty and weakness.  Psychiatric/Behavioral: Negative for sleep disturbance, dysphoric mood and agitation. The patient is not nervous/anxious.   B shins with no edema  BP Readings from Last 3 Encounters:  10/05/14 120/76  04/02/14 110/80  02/25/14 132/72   Wt Readings from Last 3 Encounters:  10/05/14 344 lb (156.037 kg)  04/02/14 339 lb (153.769 kg)  02/25/14 342 lb 6.4 oz (155.312 kg)   BP 120/76 mmHg  Pulse 83  Ht 6\' 3"  (1.905 m)  Wt 344 lb (156.037 kg)  BMI 43.00 kg/m2  SpO2 95%      Objective:   Physical Exam  Constitutional: He is oriented to person, place, and time. He appears well-developed.  Obese  HENT:  Mouth/Throat: Oropharynx is clear and moist.  Eyes: Conjunctivae are normal. Pupils are equal, round, and reactive to light.  Neck: Normal range of motion. No JVD present. No thyromegaly present.  Cardiovascular: Normal rate, regular rhythm, normal heart sounds and intact distal pulses.  Exam reveals no gallop and no friction rub.   No murmur heard. Pulmonary/Chest: Effort normal and breath sounds normal. No respiratory distress. He has no wheezes. He has no  rales. He exhibits no tenderness.  Abdominal: Soft. Bowel sounds are normal. He exhibits no distension and no mass. There is no tenderness. There is no rebound and no guarding.  Genitourinary: Rectum normal and penis normal. Guaiac negative stool. No penile tenderness.  Scrotum w/B hydrocele Prostate 1+  Musculoskeletal: Normal range of motion. He exhibits tenderness. He exhibits no edema.  Limp, using a crutch  Lymphadenopathy:    He has no cervical adenopathy.   Neurological: He is alert and oriented to person, place, and time. He has normal reflexes. No cranial nerve deficit. He exhibits normal muscle tone. Coordination normal.  Skin: Skin is warm and dry. No rash noted.  Psychiatric: He has a normal mood and affect. His behavior is normal. Judgment and thought content normal.  R hip pain is ok L hip is tender   Lab Results  Component Value Date   WBC 6.2 10/05/2014   HGB 15.5 10/05/2014   HCT 43.9 10/05/2014   PLT 208.0 10/05/2014   GLUCOSE 168* 10/05/2014   CHOL 140 10/05/2014   TRIG 155.0* 10/05/2014   HDL 31.00* 10/05/2014   LDLDIRECT 93.3 04/02/2014   LDLCALC 78 10/05/2014   ALT 20 10/05/2014   AST 16 10/05/2014   NA 138 10/05/2014   K 4.0 10/05/2014   CL 105 10/05/2014   CREATININE 0.79 10/05/2014   BUN 16 10/05/2014   CO2 27 10/05/2014   TSH 2.88 10/05/2014   PSA 0.78 10/05/2014   INR 1.07 01/28/2013   HGBA1C 7.2* 10/05/2014        Assessment & Plan:

## 2014-10-16 ENCOUNTER — Other Ambulatory Visit: Payer: Self-pay | Admitting: Internal Medicine

## 2014-10-26 ENCOUNTER — Ambulatory Visit: Payer: Self-pay | Admitting: Orthopedic Surgery

## 2014-10-26 NOTE — Progress Notes (Signed)
Preoperative surgical orders have been place into the Epic hospital system for Joel Medina on 10/26/2014, 11:44 AM  by Mickel Crow for surgery on 11-24-2014.  Preop Total Hip - Anterior Approach orders including IV Tylenol, and IV Decadron as long as there are no contraindications to the above medications. Arlee Muslim, PA-C

## 2014-11-17 NOTE — Patient Instructions (Addendum)
Joel Medina  11/17/2014   Your procedure is scheduled on: 11/24/2014    Report to [facility] Main  Entrance and follow signs to               [place] at     [place].  Call this number if you have problems the morning of surgery [phone]   Remember: ONLY 1 PERSON MAY GO WITH YOU TO SHORT STAY TO GET  READY MORNING OF [place].  Do not eat food or drink liquids :After Midnight.     Take these medicines the morning of surgery with A SIP OF WATER: Norvasc (amlodipine)                               You may not have any metal on your body including hair pins and              piercings  Do not wear jewelry, , lotions, powders or perfumes, deodorant                       Men may shave face and neck.   Do not bring valuables to the hospital. [place].  Contacts, dentures or bridgework may not be worn into surgery.  Leave suitcase in the car. After surgery it may be brought to your room.       Special Instructions:coughing and deep breathing exercises,leg exercises               Please read over the following fact sheets you were given: _____________________________________________________________________             [facility] - Preparing for Surgery Before surgery, you can play an important role.  Because skin is not sterile, your skin needs to be as free of germs as possible.  You can reduce the number of germs on your skin by washing with CHG (chlorahexidine gluconate) soap before surgery.  CHG is an antiseptic cleaner which kills germs and bonds with the skin to continue killing germs even after washing. Please DO NOT use if you have an allergy to CHG or antibacterial soaps.  If your skin becomes reddened/irritated stop using the CHG and inform your nurse when you arrive at Short Stay. Do not shave (including legs and underarms) for at least 48 hours prior to the first CHG shower.  You may  shave your face/neck. Please follow these instructions carefully:  1.  Shower with CHG Soap the night before surgery and the  morning of Surgery.  2.  If you choose to wash your hair, wash your hair first as usual with your  normal  shampoo.  3.  After you shampoo, rinse your hair and body thoroughly to remove the  shampoo.                           4.  Use CHG as you would any other liquid soap.  You can apply chg directly  to the skin and wash                       Gently with a scrungie or clean washcloth.  5.  Apply the CHG Soap to your body ONLY FROM THE NECK DOWN.   Do not use on face/ open                           Wound or open sores. Avoid contact with eyes, ears mouth and genitals (private parts).                       Wash face,  Genitals (private parts) with your normal soap.             6.  Wash thoroughly, paying special attention to the area where your surgery  will be performed.  7.  Thoroughly rinse your body with warm water from the neck down.  8.  DO NOT shower/wash with your normal soap after using and rinsing off  the CHG Soap.                9.  Pat yourself dry with a clean towel.            10.  Wear clean pajamas.            11.  Place clean sheets on your bed the night of your first shower and do not  sleep with pets. Day of Surgery : Do not apply any lotions/deodorants the morning of surgery.  Please wear clean clothes to the hospital/surgery center.  FAILURE TO FOLLOW THESE INSTRUCTIONS MAY RESULT IN THE CANCELLATION OF YOUR SURGERY PATIENT SIGNATURE_________________________________  NURSE SIGNATURE__________________________________  ________________________________________________________________________  WHAT IS A BLOOD TRANSFUSION? Blood Transfusion Information  A transfusion is the replacement of blood or some of its parts. Blood is made up of multiple cells which provide different functions.  Red blood cells carry oxygen and are used for blood loss  replacement.  White blood cells fight against infection.  Platelets control bleeding.  Plasma helps clot blood.  Other blood products are available for specialized needs, such as hemophilia or other clotting disorders. BEFORE THE TRANSFUSION  Who gives blood for transfusions?   Healthy volunteers who are fully evaluated to make sure their blood is safe. This is blood bank blood. Transfusion therapy is the safest it has ever been in the practice of medicine. Before blood is taken from a donor, a complete history is taken to make sure that person has no history of diseases nor engages in risky social behavior (examples are intravenous drug use or sexual activity with multiple partners). The donor's travel history is screened to minimize risk of transmitting infections, such as malaria. The donated blood is tested for signs of infectious diseases, such as HIV and hepatitis. The blood is then tested to be sure it is compatible with you in order to minimize the chance of a transfusion reaction. If you or a relative donates blood, this is often done in anticipation of surgery and is not appropriate for emergency situations. It takes many days to process the donated blood. RISKS AND COMPLICATIONS Although transfusion therapy is very safe and saves many lives, the main dangers of transfusion include:  1. Getting an infectious disease. 2. Developing a transfusion reaction. This is an allergic reaction to something in the blood you were given. Every precaution is taken to prevent this. The decision to have a blood transfusion has been considered carefully by your caregiver before blood is given. Blood is not given unless the benefits outweigh the risks. AFTER THE TRANSFUSION  Right after receiving a  blood transfusion, you will usually feel much better and more energetic. This is especially true if your red blood cells have gotten low (anemic). The transfusion raises the level of the red blood cells which  carry oxygen, and this usually causes an energy increase.  The nurse administering the transfusion will monitor you carefully for complications. HOME CARE INSTRUCTIONS  No special instructions are needed after a transfusion. You may find your energy is better. Speak with your caregiver about any limitations on activity for underlying diseases you may have. SEEK MEDICAL CARE IF:   Your condition is not improving after your transfusion.  You develop redness or irritation at the intravenous (IV) site. SEEK IMMEDIATE MEDICAL CARE IF:  Any of the following symptoms occur over the next 12 hours:  Shaking chills.  You have a temperature by mouth above 102 F (38.9 C), not controlled by medicine.  Chest, back, or muscle pain.  People around you feel you are not acting correctly or are confused.  Shortness of breath or difficulty breathing.  Dizziness and fainting.  You get a rash or develop hives.  You have a decrease in urine output.  Your urine turns a dark color or changes to pink, red, or brown. Any of the following symptoms occur over the next 10 days:  You have a temperature by mouth above 102 F (38.9 C), not controlled by medicine.  Shortness of breath.  Weakness after normal activity.  The white part of the eye turns yellow (jaundice).  You have a decrease in the amount of urine or are urinating less often.  Your urine turns a dark color or changes to pink, red, or brown. Document Released: 06/01/2000 Document Revised: 08/27/2011 Document Reviewed: 01/19/2008 ExitCare Patient Information 2014 [place].  _______________________________________________________________________  Incentive Spirometer  An incentive spirometer is a tool that can help keep your lungs clear and active. This tool measures how well you are filling your lungs with each breath. Taking long deep breaths may help reverse or decrease the chance of developing breathing (pulmonary) problems  (especially infection) following:  A long period of time when you are unable to move or be active. BEFORE THE PROCEDURE   If the spirometer includes an indicator to show your best effort, your nurse or respiratory therapist will set it to a desired goal.  If possible, sit up straight or lean slightly forward. Try not to slouch.  Hold the incentive spirometer in an upright position. INSTRUCTIONS FOR USE  3. Sit on the edge of your bed if possible, or sit up as far as you can in bed or on a chair. 4. Hold the incentive spirometer in an upright position. 5. Breathe out normally. 6. Place the mouthpiece in your mouth and seal your lips tightly around it. 7. Breathe in slowly and as deeply as possible, raising the piston or the ball toward the top of the column. 8. Hold your breath for 3-5 seconds or for as long as possible. Allow the piston or ball to fall to the bottom of the column. 9. Remove the mouthpiece from your mouth and breathe out normally. 10. Rest for a few seconds and repeat Steps 1 through 7 at least 10 times every 1-2 hours when you are awake. Take your time and take a few normal breaths between deep breaths. 11. The spirometer may include an indicator to show your best effort. Use the indicator as a goal to work toward during each repetition. 12. After each set of 10  deep breaths, practice coughing to be sure your lungs are clear. If you have an incision (the cut made at the time of surgery), support your incision when coughing by placing a pillow or rolled up towels firmly against it. Once you are able to get out of bed, walk around indoors and cough well. You may stop using the incentive spirometer when instructed by your caregiver.  RISKS AND COMPLICATIONS  Take your time so you do not get dizzy or light-headed.  If you are in pain, you may need to take or ask for pain medication before doing incentive spirometry. It is harder to take a deep breath if you are having  pain. AFTER USE  Rest and breathe slowly and easily.  It can be helpful to keep track of a log of your progress. Your caregiver can provide you with a simple table to help with this. If you are using the spirometer at home, follow these instructions: [place] IF:   You are having difficultly using the spirometer.  You have trouble using the spirometer as often as instructed.  Your pain medication is not giving enough relief while using the spirometer.  You develop fever of 100.5 F (38.1 C) or higher. SEEK IMMEDIATE MEDICAL CARE IF:   You cough up bloody sputum that had not been present before.  You develop fever of 102 F (38.9 C) or greater.  You develop worsening pain at or near the incision site. MAKE SURE YOU:   Understand these instructions.  Will watch your condition.  Will get help right away if you are not doing well or get worse. Document Released: 10/15/2006 Document Revised: 08/27/2011 Document Reviewed: 12/16/2006 [facility] Patient Information 2014 [place], [place].   ________________________________________________________________________

## 2014-11-18 ENCOUNTER — Other Ambulatory Visit (HOSPITAL_COMMUNITY): Payer: Self-pay | Admitting: *Deleted

## 2014-11-19 ENCOUNTER — Encounter (HOSPITAL_COMMUNITY)
Admission: RE | Admit: 2014-11-19 | Discharge: 2014-11-19 | Disposition: A | Discharge status: Home / Self Care | Payer: 59 | Source: Ambulatory Visit | Attending: Orthopedic Surgery | Admitting: Orthopedic Surgery

## 2014-11-19 ENCOUNTER — Encounter (HOSPITAL_COMMUNITY): Payer: Self-pay

## 2014-11-19 DIAGNOSIS — Z01812 Encounter for preprocedural laboratory examination: Secondary | ICD-10-CM | POA: Insufficient documentation

## 2014-11-19 DIAGNOSIS — M1611 Unilateral primary osteoarthritis, right hip: Secondary | ICD-10-CM | POA: Insufficient documentation

## 2014-11-19 DIAGNOSIS — Z01810 Encounter for preprocedural cardiovascular examination: Secondary | ICD-10-CM | POA: Diagnosis present

## 2014-11-19 HISTORY — DX: Type 2 diabetes mellitus without complications: E11.9

## 2014-11-19 LAB — COMPREHENSIVE METABOLIC PANEL
ALT: 23 U/L (ref 17–63)
AST: 25 U/L (ref 15–41)
Albumin: 4 g/dL (ref 3.5–5.0)
Alkaline Phosphatase: 61 U/L (ref 38–126)
Anion gap: 10 (ref 5–15)
BILIRUBIN TOTAL: 0.8 mg/dL (ref 0.3–1.2)
BUN: 11 mg/dL (ref 6–20)
CHLORIDE: 100 mmol/L — AB (ref 101–111)
CO2: 25 mmol/L (ref 22–32)
Calcium: 9.2 mg/dL (ref 8.9–10.3)
Creatinine, Ser: 0.86 mg/dL (ref 0.61–1.24)
GLUCOSE: 159 mg/dL — AB (ref 65–99)
POTASSIUM: 3.9 mmol/L (ref 3.5–5.1)
Sodium: 135 mmol/L (ref 135–145)
Total Protein: 7 g/dL (ref 6.5–8.1)

## 2014-11-19 LAB — URINALYSIS, ROUTINE W REFLEX MICROSCOPIC
Bilirubin Urine: NEGATIVE
GLUCOSE, UA: NEGATIVE mg/dL
HGB URINE DIPSTICK: NEGATIVE
KETONES UR: NEGATIVE mg/dL
LEUKOCYTES UA: NEGATIVE
Nitrite: NEGATIVE
PROTEIN: NEGATIVE mg/dL
Specific Gravity, Urine: 1.012 (ref 1.005–1.030)
Urobilinogen, UA: 1 mg/dL (ref 0.0–1.0)
pH: 5.5 (ref 5.0–8.0)

## 2014-11-19 LAB — CBC
HEMATOCRIT: 43 % (ref 39.0–52.0)
Hemoglobin: 15.3 g/dL (ref 13.0–17.0)
MCH: 32.7 pg (ref 26.0–34.0)
MCHC: 35.6 g/dL (ref 30.0–36.0)
MCV: 91.9 fL (ref 78.0–100.0)
Platelets: 204 10*3/uL (ref 150–400)
RBC: 4.68 MIL/uL (ref 4.22–5.81)
RDW: 13.2 % (ref 11.5–15.5)
WBC: 6.2 10*3/uL (ref 4.0–10.5)

## 2014-11-19 LAB — SURGICAL PCR SCREEN
MRSA, PCR: NEGATIVE
STAPHYLOCOCCUS AUREUS: NEGATIVE

## 2014-11-19 LAB — APTT: APTT: 41 s — AB (ref 24–37)

## 2014-11-19 LAB — PROTIME-INR
INR: 1.08 (ref 0.00–1.49)
PROTHROMBIN TIME: 14.2 s (ref 11.6–15.2)

## 2014-11-19 NOTE — Progress Notes (Signed)
   11/19/14 0827  OBSTRUCTIVE SLEEP APNEA  Have you ever been diagnosed with sleep apnea through a sleep study? No  Do you snore loudly (loud enough to be heard through closed doors)?  1  Do you often feel tired, fatigued, or sleepy during the daytime? 0  Has anyone observed you stop breathing during your sleep? 1  Do you have, or are you being treated for high blood pressure? 1  BMI more than 35 kg/m2? 1  Age over 53 years old? 1  Neck circumference greater than 40 cm/16 inches? 0  Gender: 1  Obstructive Sleep Apnea Score 6

## 2014-11-19 NOTE — Progress Notes (Signed)
PTT lab done at preop visit on 11-19-14 sent to Dr. Wynelle Link via [facility]

## 2014-11-19 NOTE — Progress Notes (Signed)
LOV 10-05-14 - Dr. Alain Marion - EPIC Surgery clearance note - 04-02-14 - Dr. Alain Marion - on chart

## 2014-11-23 ENCOUNTER — Ambulatory Visit: Payer: Self-pay | Admitting: Orthopedic Surgery

## 2014-11-23 MED ORDER — DEXTROSE 5 % IV SOLN
3.0000 g | INTRAVENOUS | Status: AC
  Administered 2014-11-24: 3 g via INTRAVENOUS
  Filled 2014-11-23: qty 3000

## 2014-11-23 NOTE — H&P (Signed)
Joel Medina DOB: Jun 06, 1961 Married / Language: English / Race: White Male Date of Admission:  11/24/2014 CC:  Right Hip Pain History of Present Illness The patient is a 54 year old male who comes in for a preoperative History and Physical. The patient is scheduled for a right total hip arthroplasty (anterior approach) to be performed by Dr. Dione Plover. Aluisio, MD at [facility] on 11-24-2014. The patient is a 54 year old male who presents today for follow up of their hip. The patient is being followed for their right hip pain and osteoarthritis. They are 6 day(s) out from the last office visit, with [facility]. Symptoms reported today include: pain. The patient feels that they are doing poorly and report their pain level to be moderate. The following medication has been used for pain control: none. Note for "Follow-up Hip": He is ready to proceed with surgery. I replaced [name]'s left hip a couple years ago. He says he is doing fantastic with that. The right hip is now getting progressively more painful. The right side is hurting not quite as bad as the left one did prior to surgery, but is rapidly getting that way. He is having a lot of dysfunction with it. His role has somewhat expanded with the city [employer], and he is doing a lot more physical work now. This is causing more discomfort in his hip. He is at a stage where he wants to proceed with fixing it. They have been treated conservatively in the past for the above stated problem and despite conservative measures, they continue to have progressive pain and severe functional limitations and dysfunction. They have failed non-operative management including home exercise, medications. It is felt that they would benefit from undergoing total joint replacement. Risks and benefits of the procedure have been discussed with the patient and they elect to proceed with surgery. There are no active contraindications to surgery such as ongoing  infection or rapidly progressive neurological disease.  Problem List/Past Medical  S/P hip replacement (Z96.649) Osteoarthritis, Hip (715.35) Primary osteoarthritis of right hip (M16.11) High blood pressure Obesity Hypercholesterolemia Non-Insulin Dependent Diabetes Mellitus Osteoarthrosis NOS, other spec site (715.98)08/22/2004 Lumbago (M54.5)08/22/2004  Allergies No Known Drug Allergies  Family History Heart Disease grandfather mothers side Diabetes Mellitus grandfather mothers side Hypertension mother, brother and grandfather mothers side  Social History Alcohol use current drinker; drinks beer; 8-14 per week Drug/Alcohol Rehab (Currently) no Exercise Exercises rarely; does other Illicit drug use no Marital status married Previously in rehab no Tobacco / smoke exposure no Children 4 Current work status working full time Tobacco use Never smoker. never smoker; smoke(d) less than 1/2 pack(s) per day; uses less than half 1/2 can(s) smokeless per week Pain Contract yes Most recent primary occupation Scientist, forensic Living situation live with spouse Number of flights of stairs before winded less than 1  Medication History AmLODIPine Besylate (10MG  Tablet, Oral) Active. Benicar HCT (40-25MG  Tablet, Oral) Active. MetFORMIN HCl (1000MG  Tablet, Oral) Active. Klor-Con 10 (10MEQ Tablet ER, Oral) Active. Tylenol Extra Strength (500MG  Tablet, Oral) Active. Aleve Active. Motrin Active. Aspirin 81 mg Active.  Past Surgical History  Ulnar Surgery Bunionectomy Total Hip Replacement - Left Date: 10/2012.  Review of Systems General Not Present- Chills, Fatigue, Fever, Memory Loss, Night Sweats, Weight Gain and Weight Loss. Skin Not Present- Eczema, Hives, Itching, Lesions and Rash. HEENT Not Present- Dentures, Double Vision, Headache, Hearing Loss, Tinnitus and Visual Loss. Respiratory Not Present- Allergies, Chronic Cough,  Coughing up blood, Shortness of breath at rest and Shortness of breath with exertion. Cardiovascular Not Present- Chest Pain, Difficulty Breathing Lying Down, Murmur, Palpitations, Racing/skipping heartbeats and Swelling. Gastrointestinal Not Present- Abdominal Pain, Bloody Stool, Constipation, Diarrhea, Difficulty Swallowing, Heartburn, Jaundice, Loss of appetitie, Nausea and Vomiting. Male Genitourinary Not Present- Blood in Urine, Discharge, Flank Pain, Incontinence, Painful Urination, Urgency, Urinary frequency, Urinary Retention, Urinating at Night and Weak urinary stream. Musculoskeletal Present- Joint Pain, Morning Stiffness and Spasms. Not Present- Back Pain, Joint Swelling, Muscle Pain and Muscle Weakness. Neurological Not Present- Blackout spells, Difficulty with balance, Dizziness, Paralysis, Tremor and Weakness. Psychiatric Not Present- Insomnia.  Vitals Weight: 344 lb Height: 75in Weight was reported by patient. Height was reported by patient. Body Surface Area: 2.76 m Body Mass Index: 43 kg/m  BP: 140/98 (Sitting, Left Arm, Standard)  Physical Exam General Mental Status -Alert, cooperative and good historian. General Appearance-pleasant, Not in acute distress. Orientation-Oriented X3. Build & Nutrition-Well nourished and Well developed.  Head and Neck Head-normocephalic, atraumatic . Neck Global Assessment - supple, no bruit auscultated on the right, no bruit auscultated on the left.  Eye Vision-Wears corrective lenses. Pupil - Bilateral-Regular and Round. Motion - Bilateral-EOMI.  Chest and Lung Exam Auscultation Breath sounds - clear at anterior chest wall and clear at posterior chest wall. Adventitious sounds - No Adventitious sounds.  Cardiovascular Auscultation Rhythm - Regular rate and rhythm. Heart Sounds - S1 WNL and S2 WNL. Murmurs & Other Heart Sounds - Auscultation of the heart reveals - No  Murmurs.  Abdomen Palpation/Percussion Tenderness - Abdomen is non-tender to palpation. Rigidity (guarding) - Abdomen is soft. Auscultation Auscultation of the abdomen reveals - Bowel sounds normal.  Male Genitourinary Note: Not done, not pertinent to present illness   Musculoskeletal Note: On exam he is alert and oriented, in no apparent distress. His left hip shows flexion to 120, rotating in 30, out 40, abducting 40 without discomfort. Right hip flexion to 110, no internal rotation, about 20 degrees of external rotation, 20 degrees of abduction. Pulses, sensation, and motor intact distally. Has an antalgic gait pattern on the right side.  RADIOGRAPHS: AP pelvis and lateral of the right hip and lateral of the left from last visit show his prosthesis on the left in excellent position, no periprosthetic abnormalities. On the right he is bone on bone with significant osteophyte formation.  Assessment & Plan Primary osteoarthritis of right hip (M16.11) Note:Surgical Plans: Right Total Hip Replacement - Anterior Approach  Disposition: Home  PCP: Dr. Alain Marion - Patient has been seen preoperatively and felt to be stable for surgery.  IV TXA  Anesthesia Issues: None  Signed electronically by Joelene Millin, III PA-C

## 2014-11-23 NOTE — Anesthesia Preprocedure Evaluation (Addendum)
Anesthesia Evaluation  Patient identified by MRN, date of birth, ID band Patient awake    Reviewed: Allergy & Precautions, H&P , NPO status , Patient's Chart, lab work & pertinent test results  Airway Mallampati: III  TM Distance: >3 FB Neck ROM: full    Dental no notable dental hx. (+) Teeth Intact, Dental Advisory Given   Pulmonary neg pulmonary ROS,  breath sounds clear to auscultation  Pulmonary exam normal       Cardiovascular Exercise Tolerance: Good hypertension, Pt. on medications negative cardio ROS Normal cardiovascular examRhythm:regular Rate:Normal     Neuro/Psych negative neurological ROS  negative psych ROS   GI/Hepatic negative GI ROS, Neg liver ROS,   Endo/Other  negative endocrine ROSdiabetes, Well Controlled, Type 2, Oral Hypoglycemic AgentsMorbid obesity  Renal/GU negative Renal ROS  negative genitourinary   Musculoskeletal   Abdominal (+) + obese,   Peds  Hematology negative hematology ROS (+)   Anesthesia Other Findings   Reproductive/Obstetrics negative OB ROS                           Anesthesia Physical Anesthesia Plan  ASA: III  Anesthesia Plan: Spinal   Post-op Pain Management:    Induction:   Airway Management Planned:   Additional Equipment:   Intra-op Plan:   Post-operative Plan:   Informed Consent: I have reviewed the patients History and Physical, chart, labs and discussed the procedure including the risks, benefits and alternatives for the proposed anesthesia with the patient or authorized representative who has indicated his/her understanding and acceptance.   Dental Advisory Given  Plan Discussed with: CRNA and Surgeon  Anesthesia Plan Comments: (Risks of PDPH discussed and accepted)       Anesthesia Quick Evaluation

## 2014-11-24 ENCOUNTER — Inpatient Hospital Stay (HOSPITAL_COMMUNITY): Payer: 59 | Admitting: Anesthesiology

## 2014-11-24 ENCOUNTER — Inpatient Hospital Stay (HOSPITAL_COMMUNITY): Payer: 59

## 2014-11-24 ENCOUNTER — Encounter (HOSPITAL_COMMUNITY)
Admission: RE | Disposition: A | Discharge status: Home / Self Care | Payer: Self-pay | Source: Ambulatory Visit | Attending: Orthopedic Surgery

## 2014-11-24 ENCOUNTER — Encounter (HOSPITAL_COMMUNITY): Payer: Self-pay | Admitting: *Deleted

## 2014-11-24 ENCOUNTER — Inpatient Hospital Stay (HOSPITAL_COMMUNITY)
Admission: RE | Admit: 2014-11-24 | Discharge: 2014-11-25 | DRG: 470 | Disposition: A | Discharge status: Home / Self Care | Payer: 59 | Source: Ambulatory Visit | Attending: Orthopedic Surgery | Admitting: Orthopedic Surgery

## 2014-11-24 DIAGNOSIS — I10 Essential (primary) hypertension: Secondary | ICD-10-CM | POA: Diagnosis present

## 2014-11-24 DIAGNOSIS — Z96649 Presence of unspecified artificial hip joint: Secondary | ICD-10-CM

## 2014-11-24 DIAGNOSIS — M169 Osteoarthritis of hip, unspecified: Secondary | ICD-10-CM | POA: Diagnosis present

## 2014-11-24 DIAGNOSIS — E669 Obesity, unspecified: Secondary | ICD-10-CM | POA: Diagnosis present

## 2014-11-24 DIAGNOSIS — Z7982 Long term (current) use of aspirin: Secondary | ICD-10-CM | POA: Diagnosis not present

## 2014-11-24 DIAGNOSIS — M1611 Unilateral primary osteoarthritis, right hip: Secondary | ICD-10-CM | POA: Diagnosis present

## 2014-11-24 DIAGNOSIS — E780 Pure hypercholesterolemia: Secondary | ICD-10-CM | POA: Diagnosis present

## 2014-11-24 DIAGNOSIS — Z96642 Presence of left artificial hip joint: Secondary | ICD-10-CM | POA: Diagnosis present

## 2014-11-24 DIAGNOSIS — Z6841 Body Mass Index (BMI) 40.0 and over, adult: Secondary | ICD-10-CM | POA: Diagnosis not present

## 2014-11-24 DIAGNOSIS — M25551 Pain in right hip: Secondary | ICD-10-CM | POA: Diagnosis present

## 2014-11-24 DIAGNOSIS — E119 Type 2 diabetes mellitus without complications: Secondary | ICD-10-CM | POA: Diagnosis present

## 2014-11-24 DIAGNOSIS — M545 Low back pain: Secondary | ICD-10-CM | POA: Diagnosis present

## 2014-11-24 HISTORY — PX: TOTAL HIP ARTHROPLASTY: SHX124

## 2014-11-24 LAB — GLUCOSE, CAPILLARY
GLUCOSE-CAPILLARY: 170 mg/dL — AB (ref 65–99)
Glucose-Capillary: 156 mg/dL — ABNORMAL HIGH (ref 65–99)
Glucose-Capillary: 179 mg/dL — ABNORMAL HIGH (ref 65–99)
Glucose-Capillary: 244 mg/dL — ABNORMAL HIGH (ref 65–99)

## 2014-11-24 LAB — TYPE AND SCREEN
ABO/RH(D): A POS
Antibody Screen: NEGATIVE

## 2014-11-24 SURGERY — ARTHROPLASTY, HIP, TOTAL, ANTERIOR APPROACH
Anesthesia: Spinal | Site: Hip | Laterality: Right

## 2014-11-24 MED ORDER — PROPOFOL 10 MG/ML IV BOLUS
INTRAVENOUS | Status: DC | PRN
  Administered 2014-11-24: 20 mg via INTRAVENOUS

## 2014-11-24 MED ORDER — MIDAZOLAM HCL 2 MG/2ML IJ SOLN
INTRAMUSCULAR | Status: AC
  Filled 2014-11-24: qty 2

## 2014-11-24 MED ORDER — FENTANYL CITRATE (PF) 100 MCG/2ML IJ SOLN
INTRAMUSCULAR | Status: AC
  Filled 2014-11-24: qty 2

## 2014-11-24 MED ORDER — ACETAMINOPHEN 650 MG RE SUPP: 650.0000 mg | Freq: Four times a day (QID) | RECTAL | Status: DC | PRN

## 2014-11-24 MED ORDER — LACTATED RINGERS IV SOLN: INTRAVENOUS | Status: DC

## 2014-11-24 MED ORDER — POLYETHYLENE GLYCOL 3350 17 G PO PACK: 17.0000 g | PACK | Freq: Every day | ORAL | Status: DC | PRN

## 2014-11-24 MED ORDER — METOCLOPRAMIDE HCL 10 MG PO TABS: 5.0000 mg | ORAL_TABLET | Freq: Three times a day (TID) | ORAL | Status: DC | PRN

## 2014-11-24 MED ORDER — ONDANSETRON HCL 4 MG PO TABS: 4.0000 mg | ORAL_TABLET | Freq: Four times a day (QID) | ORAL | Status: DC | PRN

## 2014-11-24 MED ORDER — ACETAMINOPHEN 325 MG PO TABS: 650.0000 mg | ORAL_TABLET | Freq: Four times a day (QID) | ORAL | Status: DC | PRN

## 2014-11-24 MED ORDER — CEFAZOLIN SODIUM-DEXTROSE 2-3 GM-% IV SOLR
2.0000 g | Freq: Four times a day (QID) | INTRAVENOUS | Status: AC
  Administered 2014-11-24 (×2): 2 g via INTRAVENOUS
  Filled 2014-11-24 (×2): qty 50

## 2014-11-24 MED ORDER — ACETAMINOPHEN 10 MG/ML IV SOLN
1000.0000 mg | Freq: Once | INTRAVENOUS | Status: AC
  Administered 2014-11-24: 1000 mg via INTRAVENOUS
  Filled 2014-11-24 (×2): qty 100

## 2014-11-24 MED ORDER — HYDROMORPHONE HCL 1 MG/ML IJ SOLN
0.2500 mg | INTRAMUSCULAR | Status: DC | PRN
  Administered 2014-11-24 (×2): 0.5 mg via INTRAVENOUS

## 2014-11-24 MED ORDER — PROPOFOL 10 MG/ML IV BOLUS
INTRAVENOUS | Status: AC
  Filled 2014-11-24: qty 20

## 2014-11-24 MED ORDER — POTASSIUM CHLORIDE ER 10 MEQ PO TBCR
10.0000 meq | EXTENDED_RELEASE_TABLET | Freq: Every day | ORAL | Status: DC
  Administered 2014-11-24 – 2014-11-25 (×2): 10 meq via ORAL
  Filled 2014-11-24 (×3): qty 1

## 2014-11-24 MED ORDER — RIVAROXABAN 10 MG PO TABS
10.0000 mg | ORAL_TABLET | Freq: Every day | ORAL | Status: DC
  Administered 2014-11-25: 10 mg via ORAL
  Filled 2014-11-24 (×2): qty 1

## 2014-11-24 MED ORDER — PHENOL 1.4 % MT LIQD: 1.0000 | OROMUCOSAL | Status: DC | PRN

## 2014-11-24 MED ORDER — POTASSIUM CHLORIDE IN NACL 20-0.9 MEQ/L-% IV SOLN
INTRAVENOUS | Status: DC
Start: 2014-11-24 — End: 2014-11-25
  Administered 2014-11-24 – 2014-11-25 (×2): via INTRAVENOUS
  Filled 2014-11-24 (×5): qty 1000

## 2014-11-24 MED ORDER — BUPIVACAINE HCL (PF) 0.25 % IJ SOLN
INTRAMUSCULAR | Status: AC
  Filled 2014-11-24: qty 30

## 2014-11-24 MED ORDER — HYDROCHLOROTHIAZIDE 25 MG PO TABS
25.0000 mg | ORAL_TABLET | Freq: Every day | ORAL | Status: DC
  Filled 2014-11-24 (×2): qty 1

## 2014-11-24 MED ORDER — TRANEXAMIC ACID 1000 MG/10ML IV SOLN
1000.0000 mg | INTRAVENOUS | Status: AC
  Administered 2014-11-24: 1000 mg via INTRAVENOUS
  Filled 2014-11-24: qty 10

## 2014-11-24 MED ORDER — DEXAMETHASONE SODIUM PHOSPHATE 10 MG/ML IJ SOLN
INTRAMUSCULAR | Status: AC
  Filled 2014-11-24: qty 1

## 2014-11-24 MED ORDER — KETOROLAC TROMETHAMINE 15 MG/ML IJ SOLN
7.5000 mg | Freq: Four times a day (QID) | INTRAMUSCULAR | Status: DC | PRN
  Administered 2014-11-24 – 2014-11-25 (×2): 7.5 mg via INTRAVENOUS
  Filled 2014-11-24 (×2): qty 1

## 2014-11-24 MED ORDER — ONDANSETRON HCL 4 MG/2ML IJ SOLN
INTRAMUSCULAR | Status: DC | PRN
  Administered 2014-11-24: 4 mg via INTRAVENOUS

## 2014-11-24 MED ORDER — HYDROMORPHONE HCL 1 MG/ML IJ SOLN
INTRAMUSCULAR | Status: DC
Start: 2014-11-24 — End: 2014-11-24
  Filled 2014-11-24: qty 1

## 2014-11-24 MED ORDER — METHOCARBAMOL 500 MG PO TABS
500.0000 mg | ORAL_TABLET | Freq: Four times a day (QID) | ORAL | Status: DC | PRN
Start: 2014-11-24 — End: 2014-11-25
  Administered 2014-11-25 (×2): 500 mg via ORAL
  Filled 2014-11-24 (×2): qty 1

## 2014-11-24 MED ORDER — INSULIN ASPART 100 UNIT/ML ~~LOC~~ SOLN
0.0000 [IU] | Freq: Three times a day (TID) | SUBCUTANEOUS | Status: DC
  Administered 2014-11-24 – 2014-11-25 (×2): 3 [IU] via SUBCUTANEOUS

## 2014-11-24 MED ORDER — HYDROMORPHONE HCL 1 MG/ML IJ SOLN
INTRAMUSCULAR | Status: AC
  Filled 2014-11-24: qty 1

## 2014-11-24 MED ORDER — METOCLOPRAMIDE HCL 5 MG/ML IJ SOLN: 5.0000 mg | Freq: Three times a day (TID) | INTRAMUSCULAR | Status: DC | PRN

## 2014-11-24 MED ORDER — TRAMADOL HCL 50 MG PO TABS: 50.0000 mg | ORAL_TABLET | Freq: Four times a day (QID) | ORAL | Status: DC | PRN

## 2014-11-24 MED ORDER — DEXAMETHASONE SODIUM PHOSPHATE 10 MG/ML IJ SOLN
10.0000 mg | Freq: Once | INTRAMUSCULAR | Status: DC
  Filled 2014-11-24: qty 1

## 2014-11-24 MED ORDER — DOCUSATE SODIUM 100 MG PO CAPS
100.0000 mg | ORAL_CAPSULE | Freq: Two times a day (BID) | ORAL | Status: DC
  Administered 2014-11-24: 100 mg via ORAL

## 2014-11-24 MED ORDER — FENTANYL CITRATE (PF) 250 MCG/5ML IJ SOLN
INTRAMUSCULAR | Status: DC | PRN
  Administered 2014-11-24: 100 ug via INTRAVENOUS

## 2014-11-24 MED ORDER — MORPHINE SULFATE 2 MG/ML IJ SOLN: 1.0000 mg | INTRAMUSCULAR | Status: DC | PRN

## 2014-11-24 MED ORDER — FUROSEMIDE 40 MG PO TABS: 40.0000 mg | ORAL_TABLET | Freq: Every day | ORAL | Status: DC | PRN

## 2014-11-24 MED ORDER — BUPIVACAINE HCL (PF) 0.25 % IJ SOLN
INTRAMUSCULAR | Status: DC | PRN
  Administered 2014-11-24: 30 mL

## 2014-11-24 MED ORDER — DEXAMETHASONE SODIUM PHOSPHATE 10 MG/ML IJ SOLN
INTRAMUSCULAR | Status: DC | PRN
  Administered 2014-11-24: 10 mg via INTRAVENOUS

## 2014-11-24 MED ORDER — MIDAZOLAM HCL 5 MG/5ML IJ SOLN
INTRAMUSCULAR | Status: DC | PRN
  Administered 2014-11-24: 2 mg via INTRAVENOUS
  Administered 2014-11-24 (×2): 1 mg via INTRAVENOUS

## 2014-11-24 MED ORDER — CHLORHEXIDINE GLUCONATE 4 % EX LIQD: 60.0000 mL | Freq: Once | CUTANEOUS | Status: DC

## 2014-11-24 MED ORDER — OXYCODONE HCL 5 MG PO TABS
5.0000 mg | ORAL_TABLET | ORAL | Status: DC | PRN
  Administered 2014-11-24: 5 mg via ORAL
  Filled 2014-11-24 (×2): qty 1

## 2014-11-24 MED ORDER — PROPOFOL INFUSION 10 MG/ML OPTIME
INTRAVENOUS | Status: DC | PRN
  Administered 2014-11-24: 120 ug/kg/min via INTRAVENOUS

## 2014-11-24 MED ORDER — DIPHENHYDRAMINE HCL 12.5 MG/5ML PO ELIX: 12.5000 mg | ORAL_SOLUTION | ORAL | Status: DC | PRN

## 2014-11-24 MED ORDER — ONDANSETRON HCL 4 MG/2ML IJ SOLN
INTRAMUSCULAR | Status: AC
  Filled 2014-11-24: qty 2

## 2014-11-24 MED ORDER — AMLODIPINE BESYLATE 10 MG PO TABS
10.0000 mg | ORAL_TABLET | Freq: Every day | ORAL | Status: DC
  Filled 2014-11-24: qty 1

## 2014-11-24 MED ORDER — DEXAMETHASONE SODIUM PHOSPHATE 10 MG/ML IJ SOLN: 10.0000 mg | Freq: Once | INTRAMUSCULAR | Status: DC

## 2014-11-24 MED ORDER — ROCURONIUM BROMIDE 100 MG/10ML IV SOLN
INTRAVENOUS | Status: AC
  Filled 2014-11-24: qty 1

## 2014-11-24 MED ORDER — LACTATED RINGERS IV SOLN
INTRAVENOUS | Status: DC | PRN
  Administered 2014-11-24 (×3): via INTRAVENOUS

## 2014-11-24 MED ORDER — FLEET ENEMA 7-19 GM/118ML RE ENEM: 1.0000 | ENEMA | Freq: Once | RECTAL | Status: AC | PRN

## 2014-11-24 MED ORDER — ACETAMINOPHEN 10 MG/ML IV SOLN
INTRAVENOUS | Status: AC
  Filled 2014-11-24: qty 100

## 2014-11-24 MED ORDER — ONDANSETRON HCL 4 MG/2ML IJ SOLN: 4.0000 mg | Freq: Four times a day (QID) | INTRAMUSCULAR | Status: DC | PRN

## 2014-11-24 MED ORDER — BISACODYL 10 MG RE SUPP: 10.0000 mg | Freq: Every day | RECTAL | Status: DC | PRN

## 2014-11-24 MED ORDER — ACETAMINOPHEN 500 MG PO TABS
1000.0000 mg | ORAL_TABLET | Freq: Four times a day (QID) | ORAL | Status: DC
  Administered 2014-11-24 – 2014-11-25 (×3): 1000 mg via ORAL
  Filled 2014-11-24 (×6): qty 2

## 2014-11-24 MED ORDER — MENTHOL 3 MG MT LOZG: 1.0000 | LOZENGE | OROMUCOSAL | Status: DC | PRN

## 2014-11-24 MED ORDER — OLMESARTAN MEDOXOMIL-HCTZ 40-25 MG PO TABS: 1.0000 | ORAL_TABLET | Freq: Every morning | ORAL | Status: DC

## 2014-11-24 MED ORDER — METHOCARBAMOL 1000 MG/10ML IJ SOLN
500.0000 mg | Freq: Four times a day (QID) | INTRAVENOUS | Status: DC | PRN
  Administered 2014-11-24: 500 mg via INTRAVENOUS
  Filled 2014-11-24 (×2): qty 5

## 2014-11-24 MED ORDER — IRBESARTAN 300 MG PO TABS
300.0000 mg | ORAL_TABLET | Freq: Every day | ORAL | Status: DC
  Filled 2014-11-24: qty 1

## 2014-11-24 MED ORDER — SODIUM CHLORIDE 0.9 % IV SOLN: INTRAVENOUS | Status: DC

## 2014-11-24 MED ORDER — METFORMIN HCL 500 MG PO TABS
1000.0000 mg | ORAL_TABLET | Freq: Two times a day (BID) | ORAL | Status: DC
  Administered 2014-11-25: 1000 mg via ORAL
  Filled 2014-11-24 (×3): qty 2

## 2014-11-24 SURGICAL SUPPLY — 42 items
BAG DECANTER FOR FLEXI CONT (MISCELLANEOUS) ×3 IMPLANT
BAG ZIPLOCK 12X15 (MISCELLANEOUS) ×3 IMPLANT
BLADE EXTENDED COATED 6.5IN (ELECTRODE) ×3 IMPLANT
BLADE SAG 18X100X1.27 (BLADE) ×3 IMPLANT
CAPT HIP TOTAL 2 ×3 IMPLANT
CLOSURE WOUND 1/2 X4 (GAUZE/BANDAGES/DRESSINGS)
COVER PERINEAL POST (MISCELLANEOUS) ×3 IMPLANT
DECANTER SPIKE VIAL GLASS SM (MISCELLANEOUS) ×3 IMPLANT
DRAPE C-ARM 42X120 X-RAY (DRAPES) ×3 IMPLANT
DRAPE STERI IOBAN 125X83 (DRAPES) ×3 IMPLANT
DRAPE U-SHAPE 47X51 STRL (DRAPES) ×9 IMPLANT
DRSG ADAPTIC 3X8 NADH LF (GAUZE/BANDAGES/DRESSINGS) ×3 IMPLANT
DRSG MEPILEX BORDER 4X4 (GAUZE/BANDAGES/DRESSINGS) ×3 IMPLANT
DRSG MEPILEX BORDER 4X8 (GAUZE/BANDAGES/DRESSINGS) ×3 IMPLANT
DURAPREP 26ML APPLICATOR (WOUND CARE) ×3 IMPLANT
ELECT REM PT RETURN 9FT ADLT (ELECTROSURGICAL) ×3
ELECTRODE REM PT RTRN 9FT ADLT (ELECTROSURGICAL) ×1 IMPLANT
EVACUATOR 1/8 PVC DRAIN (DRAIN) ×3 IMPLANT
FACESHIELD WRAPAROUND (MASK) ×12 IMPLANT
GLOVE BIO SURGEON STRL SZ7.5 (GLOVE) ×3 IMPLANT
GLOVE BIO SURGEON STRL SZ8 (GLOVE) ×6 IMPLANT
GLOVE BIOGEL PI IND STRL 8 (GLOVE) ×2 IMPLANT
GLOVE BIOGEL PI INDICATOR 8 (GLOVE) ×4
GOWN STRL REUS W/TWL LRG LVL3 (GOWN DISPOSABLE) ×3 IMPLANT
GOWN STRL REUS W/TWL XL LVL3 (GOWN DISPOSABLE) ×3 IMPLANT
KIT BASIN OR (CUSTOM PROCEDURE TRAY) ×3 IMPLANT
NDL SAFETY ECLIPSE 18X1.5 (NEEDLE) ×1 IMPLANT
NEEDLE HYPO 18GX1.5 SHARP (NEEDLE) ×2
PACK TOTAL JOINT (CUSTOM PROCEDURE TRAY) ×3 IMPLANT
PEN SKIN MARKING BROAD (MISCELLANEOUS) ×3 IMPLANT
STRIP CLOSURE SKIN 1/2X4 (GAUZE/BANDAGES/DRESSINGS) IMPLANT
SUT ETHIBOND NAB CT1 #1 30IN (SUTURE) ×3 IMPLANT
SUT MNCRL AB 4-0 PS2 18 (SUTURE) ×3 IMPLANT
SUT VIC AB 2-0 CT1 27 (SUTURE) ×4
SUT VIC AB 2-0 CT1 TAPERPNT 27 (SUTURE) ×2 IMPLANT
SUT VLOC 180 0 24IN GS25 (SUTURE) ×3 IMPLANT
SYR 30ML LL (SYRINGE) ×3 IMPLANT
SYR 50ML LL SCALE MARK (SYRINGE) IMPLANT
TOWEL OR 17X26 10 PK STRL BLUE (TOWEL DISPOSABLE) ×3 IMPLANT
TOWEL OR NON WOVEN STRL DISP B (DISPOSABLE) ×3 IMPLANT
TRAY FOLEY CATH 16FR SILVER (SET/KITS/TRAYS/PACK) ×3 IMPLANT
TRAY FOLEY W/METER SILVER 14FR (SET/KITS/TRAYS/PACK) IMPLANT

## 2014-11-24 NOTE — Op Note (Signed)
OPERATIVE REPORT  PREOPERATIVE DIAGNOSIS: Osteoarthritis of the Right hip.   POSTOPERATIVE DIAGNOSIS: Osteoarthritis of the Right  hip.   PROCEDURE: Right total hip arthroplasty, anterior approach.   SURGEON: Gaynelle Arabian, MD   ASSISTANT: Arlee Muslim, PA-C  ANESTHESIA:  Spinal  ESTIMATED BLOOD LOSS:-700 ml   DRAINS: Hemovac x1.   COMPLICATIONS: None   CONDITION: PACU - hemodynamically stable.   BRIEF CLINICAL NOTE: Joel Medina is a 54 y.o. male who has advanced end-  stage arthritis of his Right  hip with progressively worsening pain and  dysfunction.The patient has failed nonoperative management and presents for  total hip arthroplasty.   PROCEDURE IN DETAIL: After successful administration of spinal  anesthetic, the traction boots for the [facility] bed were placed on both  feet and the patient was placed onto the [facility] bed, boots placed into the leg  holders. The Right hip was then isolated from the perineum with plastic  drapes and prepped and draped in the usual sterile fashion. ASIS and  greater trochanter were marked and a oblique incision was made, starting  at about 1 cm lateral and 2 cm distal to the ASIS and coursing towards  the anterior cortex of the femur. The skin was cut with a 10 blade  through subcutaneous tissue to the level of the fascia overlying the  tensor fascia lata muscle. The fascia was then incised in line with the  incision at the junction of the anterior third and posterior 2/3rd. The  muscle was teased off the fascia and then the interval between the TFL  and the rectus was developed. The Hohmann retractor was then placed at  the top of the femoral neck over the capsule. The vessels overlying the  capsule were cauterized and the fat on top of the capsule was removed.  A Hohmann retractor was then placed anterior underneath the rectus  femoris to give exposure to the entire anterior capsule. A T-shaped  capsulotomy was performed.  The edges were tagged and the femoral head  was identified.       Osteophytes are removed off the superior acetabulum.  The femoral neck was then cut in situ with an oscillating saw. Traction  was then applied to the left lower extremity utilizing the [facility]  traction. The femoral head was then removed. Retractors were placed  around the acetabulum and then circumferential removal of the labrum was  performed. Osteophytes were also removed. Reaming starts at 47 mm to  medialize and  Increased in 2 mm increments to 53 mm. We reamed in  approximately 40 degrees of abduction, 20 degrees anteversion. A 54 mm  pinnacle acetabular shell was then impacted in anatomic position under  fluoroscopic guidance with excellent purchase. We did not need to place  any additional dome screws. A 36 mm neutral + 4 marathon liner was then  placed into the acetabular shell.       The femoral lift was then placed along the lateral aspect of the femur  just distal to the vastus ridge. The leg was  externally rotated and capsule  was stripped off the inferior aspect of the femoral neck down to the  level of the lesser trochanter, this was done with electrocautery. The femur was lifted after this was performed. The  leg was then placed and extended in adducted position to essentially delivering the femur. We also removed the capsule superiorly and the  piriformis from the piriformis  fossa to gain excellent exposure of the  proximal femur. Rongeur was used to remove some cancellous bone to get  into the lateral portion of the proximal femur for placement of the  initial starter reamer. The starter broaches was placed  the starter broach  and was shown to go down the center of the canal. Broaching  with the  Corail system was then performed starting at size 8, coursing  Up to size 14. A size 14 had excellent torsional and rotational  and axial stability. The trial high offset neck was then placed  with a 36 + 5 trial  head. The hip was then reduced. We confirmed that  the stem was in the canal both on AP and lateral x-rays. It also has excellent sizing. The hip was reduced with outstanding stability through full extension, full external rotation,  and then flexion in adduction internal rotation. AP pelvis was taken  and the leg lengths were measured and found to be exactly equal. Hip  was then dislocated again and the femoral head and neck removed. The  femoral broach was removed. Size 14 Corail stem with a high offset  neck was then impacted into the femur following native anteversion. Has  excellent purchase in the canal. Excellent torsional and rotational and  axial stability. It is confirmed to be in the canal on AP and lateral  fluoroscopic views. The 36 + 5 ceramic head was placed and the hip  reduced with outstanding stability. Again AP pelvis was taken and it  confirmed that the leg lengths were equal. The wound was then copiously  irrigated with saline solution and the capsule reattached and repaired  with Ethibond suture. 30 ml of .25% Bupivicaine injected into the capsule and into the edge of the tensor fascia lata as well as subcutaneous tissue. The fascia overlying the tensor fascia lata was  then closed with a running #1 V-Loc. Subcu was closed with interrupted  2-0 Vicryl and subcuticular running 4-0 Monocryl. Incision was cleaned  and dried. Steri-Strips and a bulky sterile dressing applied. Hemovac  drain was hooked to suction and then he was awakened and transported to  recovery in stable condition.        Please note that a surgical assistant was a medical necessity for this procedure to perform it in a safe and expeditious manner. Assistant was necessary to provide appropriate retraction of vital neurovascular structures and to prevent femoral fracture and allow for anatomic placement of the prosthesis.  Gaynelle Arabian, M.D.

## 2014-11-24 NOTE — Anesthesia Procedure Notes (Addendum)
Spinal Patient location during procedure: OR Start time: 11/24/2014 8:36 AM End time: 11/24/2014 8:49 AM Staffing Anesthesiologist: Rod Mae Performed by: anesthesiologist  Preanesthetic Checklist Completed: patient identified, site marked, surgical consent, pre-op evaluation, timeout performed, IV checked, risks and benefits discussed and monitors and equipment checked Spinal Block Patient position: sitting Prep: Betadine Patient monitoring: heart rate, continuous pulse ox and blood pressure Approach: right paramedian Location: L2-3 Injection technique: single-shot Needle Needle type: Spinocan  Needle gauge: 22 G Needle length: 9 cm Assessment Sensory level: T6 Additional Notes Expiration date of kit checked and confirmed. Patient tolerated procedure well, without complications.  Attempted midline with 25 ga. whitacre several times but could not get through.  Changed to 22 ga. Paramedian and after a few attempts lower got CSF at L2-3.    Procedure Name: MAC Date/Time: 11/24/2014 8:49 AM Performed by: Dione Booze Pre-anesthesia Checklist: Patient identified, Emergency Drugs available, Suction available and Patient being monitored Patient Re-evaluated:Patient Re-evaluated prior to inductionOxygen Delivery Method: Simple face mask Placement Confirmation: positive ETCO2

## 2014-11-24 NOTE — H&P (View-Only) (Signed)
Joel Medina DOB: May 18, 1961 Married / Language: English / Race: White Male Date of Admission:  11/24/2014 CC:  Right Hip Pain History of Present Illness The patient is a 54 year old male who comes in for a preoperative History and Physical. The patient is scheduled for a right total hip arthroplasty (anterior approach) to be performed by Dr. Dione Plover. Aluisio, MD at [facility] on 11-24-2014. The patient is a 54 year old male who presents today for follow up of their hip. The patient is being followed for their right hip pain and osteoarthritis. They are 6 day(s) out from the last office visit, with [facility]. Symptoms reported today include: pain. The patient feels that they are doing poorly and report their pain level to be moderate. The following medication has been used for pain control: none. Note for "Follow-up Hip": He is ready to proceed with surgery. I replaced Izaiha's left hip a couple years ago. He says he is doing fantastic with that. The right hip is now getting progressively more painful. The right side is hurting not quite as bad as the left one did prior to surgery, but is rapidly getting that way. He is having a lot of dysfunction with it. His role has somewhat expanded with the city [employer], and he is doing a lot more physical work now. This is causing more discomfort in his hip. He is at a stage where he wants to proceed with fixing it. They have been treated conservatively in the past for the above stated problem and despite conservative measures, they continue to have progressive pain and severe functional limitations and dysfunction. They have failed non-operative management including home exercise, medications. It is felt that they would benefit from undergoing total joint replacement. Risks and benefits of the procedure have been discussed with the patient and they elect to proceed with surgery. There are no active contraindications to surgery such as ongoing  infection or rapidly progressive neurological disease.  Problem List/Past Medical  S/P hip replacement (Z96.649) Osteoarthritis, Hip (715.35) Primary osteoarthritis of right hip (M16.11) High blood pressure Obesity Hypercholesterolemia Non-Insulin Dependent Diabetes Mellitus Osteoarthrosis NOS, other spec site (715.98)08/22/2004 Lumbago (M54.5)08/22/2004  Allergies No Known Drug Allergies  Family History Heart Disease grandfather mothers side Diabetes Mellitus grandfather mothers side Hypertension mother, brother and grandfather mothers side  Social History Alcohol use current drinker; drinks beer; 8-14 per week Drug/Alcohol Rehab (Currently) no Exercise Exercises rarely; does other Illicit drug use no Marital status married Previously in rehab no Tobacco / smoke exposure no Children 4 Current work status working full time Tobacco use Never smoker. never smoker; smoke(d) less than 1/2 pack(s) per day; uses less than half 1/2 can(s) smokeless per week Pain Contract yes Most recent primary occupation Scientist, forensic Living situation live with spouse Number of flights of stairs before winded less than 1  Medication History AmLODIPine Besylate (10MG  Tablet, Oral) Active. Benicar HCT (40-25MG  Tablet, Oral) Active. MetFORMIN HCl (1000MG  Tablet, Oral) Active. Klor-Con 10 (10MEQ Tablet ER, Oral) Active. Tylenol Extra Strength (500MG  Tablet, Oral) Active. Aleve Active. Motrin Active. Aspirin 81 mg Active.  Past Surgical History  Ulnar Surgery Bunionectomy Total Hip Replacement - Left Date: 10/2012.  Review of Systems General Not Present- Chills, Fatigue, Fever, Memory Loss, Night Sweats, Weight Gain and Weight Loss. Skin Not Present- Eczema, Hives, Itching, Lesions and Rash. HEENT Not Present- Dentures, Double Vision, Headache, Hearing Loss, Tinnitus and Visual Loss. Respiratory Not Present- Allergies, Chronic Cough,  Coughing up blood, Shortness of breath at rest and Shortness of breath with exertion. Cardiovascular Not Present- Chest Pain, Difficulty Breathing Lying Down, Murmur, Palpitations, Racing/skipping heartbeats and Swelling. Gastrointestinal Not Present- Abdominal Pain, Bloody Stool, Constipation, Diarrhea, Difficulty Swallowing, Heartburn, Jaundice, Loss of appetitie, Nausea and Vomiting. Male Genitourinary Not Present- Blood in Urine, Discharge, Flank Pain, Incontinence, Painful Urination, Urgency, Urinary frequency, Urinary Retention, Urinating at Night and Weak urinary stream. Musculoskeletal Present- Joint Pain, Morning Stiffness and Spasms. Not Present- Back Pain, Joint Swelling, Muscle Pain and Muscle Weakness. Neurological Not Present- Blackout spells, Difficulty with balance, Dizziness, Paralysis, Tremor and Weakness. Psychiatric Not Present- Insomnia.  Vitals Weight: 344 lb Height: 75in Weight was reported by patient. Height was reported by patient. Body Surface Area: 2.76 m Body Mass Index: 43 kg/m  BP: 140/98 (Sitting, Left Arm, Standard)  Physical Exam General Mental Status -Alert, cooperative and good historian. General Appearance-pleasant, Not in acute distress. Orientation-Oriented X3. Build & Nutrition-Well nourished and Well developed.  Head and Neck Head-normocephalic, atraumatic . Neck Global Assessment - supple, no bruit auscultated on the right, no bruit auscultated on the left.  Eye Vision-Wears corrective lenses. Pupil - Bilateral-Regular and Round. Motion - Bilateral-EOMI.  Chest and Lung Exam Auscultation Breath sounds - clear at anterior chest wall and clear at posterior chest wall. Adventitious sounds - No Adventitious sounds.  Cardiovascular Auscultation Rhythm - Regular rate and rhythm. Heart Sounds - S1 WNL and S2 WNL. Murmurs & Other Heart Sounds - Auscultation of the heart reveals - No  Murmurs.  Abdomen Palpation/Percussion Tenderness - Abdomen is non-tender to palpation. Rigidity (guarding) - Abdomen is soft. Auscultation Auscultation of the abdomen reveals - Bowel sounds normal.  Male Genitourinary Note: Not done, not pertinent to present illness   Musculoskeletal Note: On exam he is alert and oriented, in no apparent distress. His left hip shows flexion to 120, rotating in 30, out 40, abducting 40 without discomfort. Right hip flexion to 110, no internal rotation, about 20 degrees of external rotation, 20 degrees of abduction. Pulses, sensation, and motor intact distally. Has an antalgic gait pattern on the right side.  RADIOGRAPHS: AP pelvis and lateral of the right hip and lateral of the left from last visit show his prosthesis on the left in excellent position, no periprosthetic abnormalities. On the right he is bone on bone with significant osteophyte formation.  Assessment & Plan Primary osteoarthritis of right hip (M16.11) Note:Surgical Plans: Right Total Hip Replacement - Anterior Approach  Disposition: Home  PCP: Dr. Alain Marion - Patient has been seen preoperatively and felt to be stable for surgery.  IV TXA  Anesthesia Issues: None  Signed electronically by Joelene Millin, III PA-C

## 2014-11-24 NOTE — Transfer of Care (Signed)
Immediate Anesthesia Transfer of Care Note  Patient: Joel Medina  Procedure(s) Performed: Procedure(s): RIGHT TOTAL HIP ARTHROPLASTY ANTERIOR APPROACH (Right)  Patient Location: PACU  Anesthesia Type:MAC and Spinal  Level of Consciousness: awake, alert , oriented and patient cooperative  Airway & Oxygen Therapy: Patient Spontanous Breathing and Patient connected to face mask oxygen  Post-op Assessment: Report given to RN and Post -op Vital signs reviewed and stable  Post vital signs: Reviewed and stable  Last Vitals:  Filed Vitals:   11/24/14 0642  BP: 139/82  Pulse: 83  Temp: 36.7 C  Resp: 18    Complications: No apparent anesthesia complications

## 2014-11-24 NOTE — Evaluation (Signed)
Physical Therapy Evaluation Patient Details Name: Joel Medina MRN: [id number] DOB: 1960-07-09 Today's Date: 11/24/2014   History of Present Illness  R THR - anterior direct; s/p L THR 8/14 - posterior  Clinical Impression  Pt s/p R THR presents with decreased R LE strength/ROM and post op pain limiting functional mobility.  Pt should progress well to dc home with family assist and HHPT follow up.    Follow Up Recommendations Home health PT    Equipment Recommendations  None recommended by PT    Recommendations for Other Services OT consult     Precautions / Restrictions Precautions Precautions: Fall Restrictions Weight Bearing Restrictions: No Other Position/Activity Restrictions: WBAT      Mobility  Bed Mobility Overal bed mobility: Needs Assistance Bed Mobility: Supine to Sit     Supine to sit: Min assist     General bed mobility comments: cues for sequence and use of L LE to self assist  Transfers Overall transfer level: Needs assistance Equipment used: Rolling walker (2 wheeled) Transfers: Sit to/from Stand Sit to Stand: Min assist         General transfer comment: cues for LE management and use of UEs to self assist  Ambulation/Gait Ambulation/Gait assistance: Min assist;Min guard Ambulation Distance (Feet): 58 Feet Assistive device: Rolling walker (2 wheeled) Gait Pattern/deviations: Step-to pattern;Step-through pattern;Shuffle;Trunk flexed     General Gait Details: min cues for posture and position from [employer]            Wheelchair Mobility    Modified Rankin (Stroke Patients Only)       Balance                                             Pertinent Vitals/Pain Pain Assessment: 0-10 Pain Score: 4  Pain Location: R hip Pain Descriptors / Indicators: Aching;Sore Pain Intervention(s): Limited activity within patient's tolerance;Monitored during session;Premedicated before session;Ice applied    Home  Living Family/patient expects to be discharged to:: Private residence Living Arrangements: Children Available Help at Discharge: Family Type of Home: House Home Access: Stairs to enter Entrance Stairs-Rails: Psychiatric nurse of Steps: 5 Home Layout: Able to live on main level with bedroom/bathroom Home Equipment: Environmental consultant - 2 wheels      Prior Function Level of Independence: Independent               Hand Dominance        Extremity/Trunk Assessment   Upper Extremity Assessment: Overall WFL for tasks assessed           Lower Extremity Assessment: RLE deficits/detail      Cervical / Trunk Assessment: Normal  Communication   Communication: No difficulties  Cognition Arousal/Alertness: Awake/alert Behavior During Therapy: WFL for tasks assessed/performed Overall Cognitive Status: Within Functional Limits for tasks assessed                      General Comments      Exercises        Assessment/Plan    PT Assessment Patient needs continued PT services  PT Diagnosis Difficulty walking   PT Problem List Decreased strength;Decreased range of motion;Decreased activity tolerance;Decreased mobility;Decreased knowledge of use of DME;Obesity;Pain  PT Treatment Interventions Stair training;Gait training;DME instruction;Functional mobility training;Therapeutic activities;Therapeutic exercise;Patient/family education   PT Goals (Current goals can be found in the Care Plan  section) Acute Rehab PT Goals Patient Stated Goal: Resume previous lifestyle with decreased pain PT Goal Formulation: With patient Time For Goal Achievement: 11/27/14 Potential to Achieve Goals: Good    Frequency 7X/week   Barriers to discharge        Co-evaluation               End of Session   Activity Tolerance: Patient tolerated treatment well Patient left: in chair;with call bell/phone within reach Nurse Communication: Mobility status         Time:  4536-4680 PT Time Calculation (min) (ACUTE ONLY): 23 min   Charges:   PT Evaluation $Initial PT Evaluation Tier I: 1 Procedure PT Treatments $Gait Training: 8-22 mins   PT G Codes:        Jonh Mcqueary Dec 13, 2014, 5:45 PM

## 2014-11-24 NOTE — Anesthesia Postprocedure Evaluation (Signed)
  Anesthesia Post-op Note  Patient: Joel Medina  Procedure(s) Performed: Procedure(s) (LRB): RIGHT TOTAL HIP ARTHROPLASTY ANTERIOR APPROACH (Right)  Patient Location: PACU  Anesthesia Type: Spinal  Level of Consciousness: awake and alert   Airway and Oxygen Therapy: Patient Spontanous Breathing  Post-op Pain: mild  Post-op Assessment: Post-op Vital signs reviewed, Patient's Cardiovascular Status Stable, Respiratory Function Stable, Patent Airway and No signs of Nausea or vomiting  Last Vitals:  Filed Vitals:   11/24/14 1515  BP: 109/67  Pulse: 74  Temp: 37.2 C  Resp: 18    Post-op Vital Signs: stable   Complications: No apparent anesthesia complications

## 2014-11-24 NOTE — Interval H&P Note (Signed)
History and Physical Interval Note:  11/24/2014 8:06 AM  Joel Medina  has presented today for surgery, with the diagnosis of OA RIGHT HIP  The various methods of treatment have been discussed with the patient and family. After consideration of risks, benefits and other options for treatment, the patient has consented to  Procedure(s): RIGHT TOTAL HIP ARTHROPLASTY ANTERIOR APPROACH (Right) as a surgical intervention .  The patient's history has been reviewed, patient examined, no change in status, stable for surgery.  I have reviewed the patient's chart and labs.  Questions were answered to the patient's satisfaction.     [name]

## 2014-11-25 ENCOUNTER — Encounter (HOSPITAL_COMMUNITY): Payer: Self-pay | Admitting: Orthopedic Surgery

## 2014-11-25 LAB — BASIC METABOLIC PANEL
Anion gap: 9 (ref 5–15)
BUN: 16 mg/dL (ref 6–20)
CALCIUM: 8.4 mg/dL — AB (ref 8.9–10.3)
CO2: 23 mmol/L (ref 22–32)
Chloride: 102 mmol/L (ref 101–111)
Creatinine, Ser: 0.77 mg/dL (ref 0.61–1.24)
GFR calc non Af Amer: >60 mL/min (ref >60)
GLUCOSE: 209 mg/dL — AB (ref 65–99)
Potassium: 4.2 mmol/L (ref 3.5–5.1)
Sodium: 134 mmol/L — ABNORMAL LOW (ref 135–145)

## 2014-11-25 LAB — CBC
HEMATOCRIT: 35.6 % — AB (ref 39.0–52.0)
Hemoglobin: 12.4 g/dL — ABNORMAL LOW (ref 13.0–17.0)
MCH: 31.7 pg (ref 26.0–34.0)
MCHC: 34.8 g/dL (ref 30.0–36.0)
MCV: 91 fL (ref 78.0–100.0)
PLATELETS: 173 10*3/uL (ref 150–400)
RBC: 3.91 MIL/uL — ABNORMAL LOW (ref 4.22–5.81)
RDW: 13.1 % (ref 11.5–15.5)
WBC: 12.3 10*3/uL — AB (ref 4.0–10.5)

## 2014-11-25 LAB — GLUCOSE, CAPILLARY
Glucose-Capillary: 193 mg/dL — ABNORMAL HIGH (ref 65–99)
Glucose-Capillary: 138 mg/dL — ABNORMAL HIGH (ref 65–99)

## 2014-11-25 MED ORDER — OXYCODONE HCL 5 MG PO TABS: 5.0000 mg | ORAL_TABLET | ORAL | Status: DC | PRN

## 2014-11-25 MED ORDER — METHOCARBAMOL 500 MG PO TABS: 500.0000 mg | ORAL_TABLET | Freq: Four times a day (QID) | ORAL | Status: DC | PRN

## 2014-11-25 MED ORDER — RIVAROXABAN 10 MG PO TABS: 10.0000 mg | ORAL_TABLET | Freq: Every day | ORAL | Status: DC

## 2014-11-25 NOTE — Discharge Summary (Signed)
Physician Discharge Summary   Patient ID: Joel Medina MRN: [id number] DOB/AGE: 1960/08/31 54 y.o.  Admit date: 11/24/2014 Discharge date: 11-25-2014  Primary Diagnosis:  Osteoarthritis of the Right hip.   Admission Diagnoses:  Past Medical History  Diagnosis Date  . HTN (hypertension)   . Obesity   . History of pyelonephritis   . Arthritis     OA BOTH HIPS - PAIN IN LEFT HIP WORSE  . Diabetes mellitus without complication    Discharge Diagnoses:   Active Problems:   OA (osteoarthritis) of hip  Estimated body mass index is 42.62 kg/(m^2) as calculated from the following:   Height as of this encounter: $RemoveBeforeD'6\' 3"'[id number]$  (1.905 m).   Weight as of this encounter: 154.677 kg (341 lb).  Procedure(s) (LRB): RIGHT TOTAL HIP ARTHROPLASTY ANTERIOR APPROACH (Right)   Consults: None  HPI: Joel Medina is a 54 y.o. male who has advanced end-  stage arthritis of his Right hip with progressively worsening pain and  dysfunction.The patient has failed nonoperative management and presents for  total hip arthroplasty.   Laboratory Data: Admission on 11/24/2014  Component Date Value Ref Range Status  . Glucose-Capillary 11/24/2014 156* 65 - 99 mg/dL Final  . Glucose-Capillary 11/24/2014 170* 65 - 99 mg/dL Final  . Comment 1 11/24/2014 Notify RN   Final  . Comment 2 11/24/2014 Document in Chart   Final  . Glucose-Capillary 11/24/2014 179* 65 - 99 mg/dL Final  . WBC 11/25/2014 12.3* 4.0 - 10.5 K/uL Final  . RBC 11/25/2014 3.91* 4.22 - 5.81 MIL/uL Final  . Hemoglobin 11/25/2014 12.4* 13.0 - 17.0 g/dL Final  . HCT 11/25/2014 35.6* 39.0 - 52.0 % Final  . MCV 11/25/2014 91.0  78.0 - 100.0 fL Final  . MCH 11/25/2014 31.7  26.0 - 34.0 pg Final  . MCHC 11/25/2014 34.8  30.0 - 36.0 g/dL Final  . RDW 11/25/2014 13.1  11.5 - 15.5 % Final  . Platelets 11/25/2014 173  150 - 400 K/uL Final  . Sodium 11/25/2014 134* 135 - 145 mmol/L Final  . Potassium 11/25/2014 4.2  3.5 - 5.1 mmol/L Final  .  Chloride 11/25/2014 102  101 - 111 mmol/L Final  . CO2 11/25/2014 23  22 - 32 mmol/L Final  . Glucose, Bld 11/25/2014 209* 65 - 99 mg/dL Final  . BUN 11/25/2014 16  6 - 20 mg/dL Final  . Creatinine, Ser 11/25/2014 0.77  0.61 - 1.24 mg/dL Final  . Calcium 11/25/2014 8.4* 8.9 - 10.3 mg/dL Final  . GFR calc non Af Amer 11/25/2014 >60  >60 mL/min Final  . GFR calc Af Amer 11/25/2014 >60  >60 mL/min Final   Comment: (NOTE) The eGFR has been calculated using the CKD EPI equation. This calculation has not been validated in all clinical situations. eGFR's persistently <60 mL/min signify possible Chronic Kidney Disease.   . Anion gap 11/25/2014 9  5 - 15 Final  . Glucose-Capillary 11/24/2014 244* 65 - 99 mg/dL Final  . Comment 1 11/24/2014 Notify RN   Final  . Glucose-Capillary 11/25/2014 193* 65 - 99 mg/dL Final  Hospital Outpatient Visit on 11/19/2014  Component Date Value Ref Range Status  . MRSA, PCR 11/19/2014 NEGATIVE  NEGATIVE Final  . Staphylococcus aureus 11/19/2014 NEGATIVE  NEGATIVE Final   Comment:        The Xpert SA Assay (FDA approved for NASAL specimens in patients over 41 years of age), is one component of a comprehensive surveillance program.  Test performance  has been validated by [facility] for patients greater than or equal to 67 year old. It is not intended to diagnose infection nor to guide or monitor treatment.   Marland Kitchen aPTT 11/19/2014 41* 24 - 37 seconds Final   Comment:        IF BASELINE aPTT IS ELEVATED, SUGGEST PATIENT RISK ASSESSMENT BE USED TO DETERMINE APPROPRIATE ANTICOAGULANT THERAPY.   . WBC 11/19/2014 6.2  4.0 - 10.5 K/uL Final  . RBC 11/19/2014 4.68  4.22 - 5.81 MIL/uL Final  . Hemoglobin 11/19/2014 15.3  13.0 - 17.0 g/dL Final  . HCT 11/19/2014 43.0  39.0 - 52.0 % Final  . MCV 11/19/2014 91.9  78.0 - 100.0 fL Final  . MCH 11/19/2014 32.7  26.0 - 34.0 pg Final  . MCHC 11/19/2014 35.6  30.0 - 36.0 g/dL Final  . RDW 11/19/2014 13.2  11.5 - 15.5 %  Final  . Platelets 11/19/2014 204  150 - 400 K/uL Final  . Sodium 11/19/2014 135  135 - 145 mmol/L Final  . Potassium 11/19/2014 3.9  3.5 - 5.1 mmol/L Final  . Chloride 11/19/2014 100* 101 - 111 mmol/L Final  . CO2 11/19/2014 25  22 - 32 mmol/L Final  . Glucose, Bld 11/19/2014 159* 65 - 99 mg/dL Final  . BUN 11/19/2014 11  6 - 20 mg/dL Final  . Creatinine, Ser 11/19/2014 0.86  0.61 - 1.24 mg/dL Final  . Calcium 11/19/2014 9.2  8.9 - 10.3 mg/dL Final  . Total Protein 11/19/2014 7.0  6.5 - 8.1 g/dL Final  . Albumin 11/19/2014 4.0  3.5 - 5.0 g/dL Final  . AST 11/19/2014 25  15 - 41 U/L Final  . ALT 11/19/2014 23  17 - 63 U/L Final  . Alkaline Phosphatase 11/19/2014 61  38 - 126 U/L Final  . Total Bilirubin 11/19/2014 0.8  0.3 - 1.2 mg/dL Final  . GFR calc non Af Amer 11/19/2014 >60  >60 mL/min Final  . GFR calc Af Amer 11/19/2014 >60  >60 mL/min Final   Comment: (NOTE) The eGFR has been calculated using the CKD EPI equation. This calculation has not been validated in all clinical situations. eGFR's persistently <60 mL/min signify possible Chronic Kidney Disease.   . Anion gap 11/19/2014 10  5 - 15 Final  . Prothrombin Time 11/19/2014 14.2  11.6 - 15.2 seconds Final  . INR 11/19/2014 1.08  0.00 - 1.49 Final  . ABO/RH(D) 11/19/2014 A POS   Final  . Antibody Screen 11/19/2014 NEG   Final  . Sample Expiration 11/19/2014 11/27/2014   Final  . Color, Urine 11/19/2014 YELLOW  YELLOW Final  . APPearance 11/19/2014 CLEAR  CLEAR Final  . Specific Gravity, Urine 11/19/2014 1.012  1.005 - 1.030 Final  . pH 11/19/2014 5.5  5.0 - 8.0 Final  . Glucose, UA 11/19/2014 NEGATIVE  NEGATIVE mg/dL Final  . Hgb urine dipstick 11/19/2014 NEGATIVE  NEGATIVE Final  . Bilirubin Urine 11/19/2014 NEGATIVE  NEGATIVE Final  . Ketones, ur 11/19/2014 NEGATIVE  NEGATIVE mg/dL Final  . Protein, ur 11/19/2014 NEGATIVE  NEGATIVE mg/dL Final  . Urobilinogen, UA 11/19/2014 1.0  0.0 - 1.0 mg/dL Final  . Nitrite  11/19/2014 NEGATIVE  NEGATIVE Final  . Leukocytes, UA 11/19/2014 NEGATIVE  NEGATIVE Final   MICROSCOPIC NOT DONE ON URINES WITH NEGATIVE PROTEIN, BLOOD, LEUKOCYTES, NITRITE, OR GLUCOSE <1000 mg/dL.  Appointment on 10/05/2014  Component Date Value Ref Range Status  . Sodium 10/05/2014 138  135 - 145 mEq/L Final  .  Potassium 10/05/2014 4.0  3.5 - 5.1 mEq/L Final  . Chloride 10/05/2014 105  96 - 112 mEq/L Final  . CO2 10/05/2014 27  19 - 32 mEq/L Final  . Glucose, Bld 10/05/2014 168* 70 - 99 mg/dL Final  . BUN 10/05/2014 16  6 - 23 mg/dL Final  . Creatinine, Ser 10/05/2014 0.79  0.40 - 1.50 mg/dL Final  . Calcium 10/05/2014 9.3  8.4 - 10.5 mg/dL Final  . GFR 10/05/2014 108.96  >60.00 mL/min Final  . WBC 10/05/2014 6.2  4.0 - 10.5 K/uL Final  . RBC 10/05/2014 4.76  4.22 - 5.81 Mil/uL Final  . Hemoglobin 10/05/2014 15.5  13.0 - 17.0 g/dL Final  . HCT 10/05/2014 43.9  39.0 - 52.0 % Final  . MCV 10/05/2014 92.1  78.0 - 100.0 fl Final  . MCHC 10/05/2014 35.4  30.0 - 36.0 g/dL Final  . RDW 10/05/2014 14.1  11.5 - 15.5 % Final  . Platelets 10/05/2014 208.0  150.0 - 400.0 K/uL Final  . Neutrophils Relative % 10/05/2014 52.4  43.0 - 77.0 % Final  . Lymphocytes Relative 10/05/2014 38.7  12.0 - 46.0 % Final  . Monocytes Relative 10/05/2014 7.2  3.0 - 12.0 % Final  . Eosinophils Relative 10/05/2014 1.1  0.0 - 5.0 % Final  . Basophils Relative 10/05/2014 0.6  0.0 - 3.0 % Final  . Neutro Abs 10/05/2014 3.2  1.4 - 7.7 K/uL Final  . Lymphs Abs 10/05/2014 2.4  0.7 - 4.0 K/uL Final  . Monocytes Absolute 10/05/2014 0.4  0.1 - 1.0 K/uL Final  . Eosinophils Absolute 10/05/2014 0.1  0.0 - 0.7 K/uL Final  . Basophils Absolute 10/05/2014 0.0  0.0 - 0.1 K/uL Final  . Total Bilirubin 10/05/2014 0.6  0.2 - 1.2 mg/dL Final  . Bilirubin, Direct 10/05/2014 0.1  0.0 - 0.3 mg/dL Final  . Alkaline Phosphatase 10/05/2014 66  39 - 117 U/L Final  . AST 10/05/2014 16  0 - 37 U/L Final  . ALT 10/05/2014 20  0 - 53 U/L  Final  . Total Protein 10/05/2014 6.7  6.0 - 8.3 g/dL Final  . Albumin 10/05/2014 4.1  3.5 - 5.2 g/dL Final  . Hgb A1c MFr Bld 10/05/2014 7.2* 4.6 - 6.5 % Final   Glycemic Control Guidelines for People with Diabetes:Non Diabetic:  <6%Goal of Therapy: <7%Additional Action Suggested:  >8%   . Cholesterol 10/05/2014 140  0 - 200 mg/dL Final   ATP III Classification       Desirable:  < 200 mg/dL               Borderline High:  200 - 239 mg/dL          High:  > = 240 mg/dL  . Triglycerides 10/05/2014 155.0* 0.0 - 149.0 mg/dL Final   Normal:  <150 mg/dLBorderline High:  150 - 199 mg/dL  . HDL 10/05/2014 31.00* >39.00 mg/dL Final  . VLDL 10/05/2014 31.0  0.0 - 40.0 mg/dL Final  . LDL Cholesterol 10/05/2014 78  0 - 99 mg/dL Final  . Total CHOL/HDL Ratio 10/05/2014 5   Final                  Men          Women1/2 Average Risk     3.4          3.3Average Risk          5.0  4.42X Average Risk          9.6          7.13X Average Risk          15.0          11.0                      . NonHDL 10/05/2014 109.00   Final   NOTE:  Non-HDL goal should be 30 mg/dL higher than patient's LDL goal (i.e. LDL goal of < 70 mg/dL, would have non-HDL goal of < 100 mg/dL)  . PSA 10/05/2014 0.78  0.10 - 4.00 ng/mL Final  . TSH 10/05/2014 2.88  0.35 - 4.50 uIU/mL Final  . Color, Urine 10/05/2014 YELLOW  Yellow;Lt. Yellow Final  . APPearance 10/05/2014 CLEAR  Clear Final  . Specific Gravity, Urine 10/05/2014 1.025  1.000-1.030 Final  . pH 10/05/2014 5.5  5.0 - 8.0 Final  . Total Protein, Urine 10/05/2014 NEGATIVE  Negative Final  . Urine Glucose 10/05/2014 NEGATIVE  Negative Final  . Ketones, ur 10/05/2014 NEGATIVE  Negative Final  . Bilirubin Urine 10/05/2014 NEGATIVE  Negative Final  . Hgb urine dipstick 10/05/2014 NEGATIVE  Negative Final  . Urobilinogen, UA 10/05/2014 0.2  0.0 - 1.0 Final  . Leukocytes, UA 10/05/2014 NEGATIVE  Negative Final  . Nitrite 10/05/2014 NEGATIVE  Negative Final     X-Rays:Dg  Pelvis Portable  11/24/2014   CLINICAL DATA:  Postoperative radiographs. RIGHT total hip replacement.  EXAM: PORTABLE PELVIS 1-2 VIEWS  COMPARISON:  02/04/2013.  FINDINGS: Uncomplicated new RIGHT total hip arthroplasty. Surgical drain around the RIGHT hip. Distal stem visible and normal. Chronically enlarged scrotal soft tissue shadow. Old LEFT total hip arthroplasty incidentally noted.  IMPRESSION: Uncomplicated new RIGHT total hip arthroplasty.   Electronically Signed   By: Dereck Ligas M.D.   On: 11/24/2014 11:14   Dg C-arm 61-120 Min-no Report  11/24/2014   CLINICAL DATA: surgery   C-ARM 61-120 MINUTES  Fluoroscopy was utilized by the requesting physician.  No radiographic  interpretation.     EKG: Orders placed or performed during the hospital encounter of 11/24/14  . EKG 12-Lead  . EKG 12-Lead     Hospital Course: Patient was admitted to [facility] and taken to the OR and underwent the above state procedure without complications.  Patient tolerated the procedure well and was later transferred to the recovery room and then to the orthopaedic floor for postoperative care.  They were given PO and IV analgesics for pain control following their surgery.  They were given 24 hours of postoperative antibiotics of  Anti-infectives    Start     Dose/Rate Route Frequency Ordered Stop   11/24/14 1400  ceFAZolin (ANCEF) IVPB 2 g/50 mL premix     2 g 100 mL/hr over 30 Minutes Intravenous Every 6 hours 11/24/14 1224 11/24/14 2154   11/24/14 0600  ceFAZolin (ANCEF) 3 g in dextrose 5 % 50 mL IVPB     3 g 160 mL/hr over 30 Minutes Intravenous On call to O.R. 11/23/14 1314 11/24/14 0834     and started on DVT prophylaxis in the form of Xarelto.   PT and OT were ordered for total hip protocol.  The patient was allowed to be WBAT with therapy. Discharge planning was consulted to help with postop disposition and equipment needs.  Patient had a decent night on the evening of surgery.  He got up and  walked  over 50 feet that afternoon.   They started to get up OOB with therapy on day one again.  Patient was seen in rounds by Dr. Wynelle Link onf POD 1 and wanted to go home later that day.  Discharge home with home health Diet - Cardiac diet and Diabetic diet Follow up - in 2 weeks Activity - WBAT Disposition - Home Condition Upon Discharge - Good D/C Meds - See DC Summary DVT Prophylaxis - Xarelto  Discharge Instructions    Call MD / Call 911    Complete by:  As directed   If you experience chest pain or shortness of breath, CALL 911 and be transported to the hospital emergency room.  If you develope a fever above 101 F, pus (white drainage) or increased drainage or redness at the wound, or calf pain, call your surgeon's office.     Change dressing    Complete by:  As directed   You may change your dressing starting tomorrow, Friday 6.10.2016, and apply a dry dressing daily with sterile 4 x 4 inch gauze dressing and paper tape.  Do not submerge the incision under water.     Constipation Prevention    Complete by:  As directed   Drink plenty of fluids.  Prune juice may be helpful.  You may use a stool softener, such as Colace (over the counter) 100 mg twice a day.  Use MiraLax (over the counter) for constipation as needed.     Diet - low sodium heart healthy    Complete by:  As directed      Diet Carb Modified    Complete by:  As directed      Discharge instructions    Complete by:  As directed   Pick up stool softner and laxative for home use following surgery while on pain medications. Do not submerge incision under water. Please use good hand washing techniques while changing dressing each day. May shower starting three days after surgery starting Saturday 11/27/2014. Please use a clean towel to pat the incision dry following showers. Continue to use ice for pain and swelling after surgery. Do not use any lotions or creams on the incision until instructed by your surgeon.  Total Hip  Protocol.  Take Xarelto for two and a half more weeks, then discontinue Xarelto. Once the patient has completed the blood thinner regimen, then take a Baby 81 mg Aspirin daily for three more weeks.  Postoperative Constipation Protocol  Constipation - defined medically as fewer than three stools per week and severe constipation as less than one stool per week.  One of the most common issues patients have following surgery is constipation.  Even if you have a regular bowel pattern at home, your normal regimen is likely to be disrupted due to multiple reasons following surgery.  Combination of anesthesia, postoperative narcotics, change in appetite and fluid intake all can affect your bowels.  In order to avoid complications following surgery, here are some recommendations in order to help you during your recovery period.  Colace (docusate) - Pick up an over-the-counter form of Colace or another stool softener and take twice a day as long as you are requiring postoperative pain medications.  Take with a full glass of water daily.  If you experience loose stools or diarrhea, hold the colace until you stool forms back up.  If your symptoms do not get better within 1 week or if they get worse, check with your doctor.  Dulcolax (bisacodyl) -  Pick up over-the-counter and take as directed by the product packaging as needed to assist with the movement of your bowels.  Take with a full glass of water.  Use this product as needed if not relieved by Colace only.   MiraLax (polyethylene glycol) - Pick up over-the-counter to have on hand.  MiraLax is a solution that will increase the amount of water in your bowels to assist with bowel movements.  Take as directed and can mix with a glass of water, juice, soda, coffee, or tea.  Take if you go more than two days without a movement. Do not use MiraLax more than once per day. Call your doctor if you are still constipated or irregular after using this medication for 7  days in a row.  If you continue to have problems with postoperative constipation, please contact the office for further assistance and recommendations.  If you experience "the worst abdominal pain ever" or develop nausea or vomiting, please contact the office immediatly for further recommendations for treatment.     Do not sit on low chairs, stoools or toilet seats, as it may be difficult to get up from low surfaces    Complete by:  As directed      Driving restrictions    Complete by:  As directed   No driving until released by the physician.     Increase activity slowly as tolerated    Complete by:  As directed      Lifting restrictions    Complete by:  As directed   No lifting until released by the physician.     Patient may shower    Complete by:  As directed   You may shower without a dressing once there is no drainage.  Do not wash over the wound.  If drainage remains, do not shower until drainage stops.     TED hose    Complete by:  As directed   Use stockings (TED hose) for 3 weeks on both leg(s).  You may remove them at night for sleeping.     Weight bearing as tolerated    Complete by:  As directed   Laterality:  right  Extremity:  Lower            Medication List    STOP taking these medications        ibuprofen 200 MG tablet  Commonly known as:  ADVIL,MOTRIN      TAKE these medications        amLODipine 10 MG tablet  Commonly known as:  NORVASC  Take 1 tablet (10 mg total) by mouth daily.     furosemide 40 MG tablet  Commonly known as:  LASIX  Take 1 tablet (40 mg total) by mouth daily as needed for edema.     ketoconazole 2 % cream  Commonly known as:  NIZORAL  Apply 1 application topically 2 (two) times daily.     metFORMIN 1000 MG tablet  Commonly known as:  GLUCOPHAGE  Take 1 tablet (1,000 mg total) by mouth 2 (two) times daily with a meal.     methocarbamol 500 MG tablet  Commonly known as:  ROBAXIN  Take 1 tablet (500 mg total) by mouth every  6 (six) hours as needed for muscle spasms.     olmesartan-hydrochlorothiazide 40-25 MG per tablet  Commonly known as:  BENICAR HCT  Take 1 tablet by mouth every morning.     oxyCODONE 5 MG immediate release tablet  Commonly known as:  Oxy IR/ROXICODONE  Take 1-2 tablets (5-10 mg total) by mouth every 3 (three) hours as needed for moderate pain, severe pain or breakthrough pain.     potassium chloride 10 MEQ tablet  Commonly known as:  KLOR-CON 10  Take 1 tablet (10 mEq total) by mouth daily.     rivaroxaban 10 MG Tabs tablet  Commonly known as:  XARELTO  Take 1 tablet (10 mg total) by mouth daily with breakfast. Take Xarelto for two and a half more weeks, then discontinue Xarelto. Once the patient has completed the blood thinner regimen, then take a Baby 81 mg Aspirin daily for three more weeks.           Follow-up Information    Follow up with [name], MD. Schedule an appointment as soon as possible for a visit on 12/07/2014.   Specialty:  Orthopedic Surgery   Why:  Call office at [phone] to setup appointment on Tuesday 6.21.2016 with Dr. Wynelle Link.   Contact information:   [address] [place] [address] [phone]       Signed: Arlee Muslim, PA-C Orthopaedic Surgery 11/25/2014, 8:23 AM

## 2014-11-25 NOTE — Progress Notes (Signed)
Patient being discharged to home. Patient had some difficulty urinating, he voided 98mL, after drinking fluids he was able to void another 123mL. Reviewed medications, education and discharge instructions with the patient and his wife. Patient stated understanding. No s/s of acute distress. Patient did not want to take the medications due right before discharge, anxious to leave as soon as possible.

## 2014-11-25 NOTE — Progress Notes (Signed)
Physical Therapy Treatment Patient Details Name: MAZIN EMMA MRN: [id number] DOB: [birth year] Today's Date: 11/25/2014    History of Present Illness R THR - anterior direct; s/p L THR 8/14 - posterior    PT Comments    Amb with B crutches at [employer] Assist.  Follow Up Recommendations  Home health PT     Equipment Recommendations  None recommended by PT    Recommendations for Other Services       Precautions / Restrictions Precautions Precautions: Fall Restrictions Weight Bearing Restrictions: No Other Position/Activity Restrictions: WBAT    Mobility  Bed Mobility               General bed mobility comments: Pt OOB in recliner  Transfers Overall transfer level: Needs assistance Equipment used: Rolling walker (2 wheeled) Transfers: Sit to/from Stand Sit to Stand: Supervision         General transfer comment: increased time  Ambulation/Gait Ambulation/Gait assistance: Min guard;Supervision Ambulation Distance (Feet): 350 Feet Assistive device: Crutches Gait Pattern/deviations: Step-through pattern Gait velocity: decreased   General Gait Details: amb with B crutched with only one VC on safety with turns   Stairs Stairs: Yes Stairs assistance: Min guard Stair Management: One rail Left;Step to pattern;Forwards;With crutches Number of Stairs: 4 General stair comments: one rail and one crutch 25% VC's on proper tech and safety  Wheelchair Mobility    Modified Rankin (Stroke Patients Only)       Balance                                    Cognition Arousal/Alertness: Awake/alert Behavior During Therapy: WFL for tasks assessed/performed Overall Cognitive Status: Within Functional Limits for tasks assessed                      Exercises      General Comments        Pertinent Vitals/Pain Pain Assessment: 0-10 Pain Score: 3  Pain Location: R hip Pain Descriptors / Indicators: Aching;Sore Pain Intervention(s):  Monitored during session;Repositioned;Ice applied    Home Living                      Prior Function            PT Goals (current goals can now be found in the care plan section)      Frequency  7X/week    PT Plan      Co-evaluation             End of Session Equipment Utilized During Treatment: Gait belt Activity Tolerance: Patient tolerated treatment well Patient left: in chair;with call bell/phone within reach     Time: 1045-1100 PT Time Calculation (min) (ACUTE ONLY): 15 min  Charges:  $Gait Training: 8-22 mins                     G Codes:      Rica Koyanagi  PTA [facility]  Acute  Rehab Pager      [phone]

## 2014-11-25 NOTE — Progress Notes (Addendum)
Physical Therapy Treatment Patient Details Name: DONOLD MAROTTO MRN: [id number] DOB: 05-24-1961 Today's Date: 11/25/2014    History of Present Illness R THR - anterior direct; s/p L THR 8/14 - posterior    PT Comments    POD # 1 assisted with amb in hallway with Bariatric RW.  Practiced stairs using one rail and one crutch then returned to room to perform THR TE's following HEP handout.  Instructed on proper tech and freq plus use of ICE.  Handout also given on stairs. Pt requested to amb with crutches, so will return to perform and access safety.   Follow Up Recommendations  Home health PT     Equipment Recommendations  None recommended by PT    Recommendations for Other Services       Precautions / Restrictions Precautions Precautions: Fall Restrictions Weight Bearing Restrictions: No Other Position/Activity Restrictions: WBAT    Mobility  Bed Mobility               General bed mobility comments: Pt OOB in recliner  Transfers Overall transfer level: Needs assistance Equipment used: Rolling walker (2 wheeled) Transfers: Sit to/from Stand Sit to Stand: Supervision         General transfer comment: increased time  Ambulation/Gait Ambulation/Gait assistance: Min guard;Supervision Ambulation Distance (Feet): 350 Feet Assistive device: Rolling walker (2 wheeled) Gait Pattern/deviations: Step-through pattern Gait velocity: decreased   General Gait Details: amb with Bariatric RW with <25% VC's on safety esp with turns.    Stairs Stairs: Yes Stairs assistance: Min guard Stair Management: One rail Left;Step to pattern;Forwards;With crutches Number of Stairs: 4 General stair comments: one rail and one crutch 25% VC's on proper tech and safety  Wheelchair Mobility    Modified Rankin (Stroke Patients Only)       Balance                                    Cognition Arousal/Alertness: Awake/alert Behavior During Therapy: WFL for tasks  assessed/performed Overall Cognitive Status: Within Functional Limits for tasks assessed                      Exercises   Total Hip Replacement TE's 10 reps ankle pumps 10 reps knee presses 10 reps heel slides 10 reps SAQ's 10 reps ABD Followed by ICE     General Comments        Pertinent Vitals/Pain Pain Assessment: 0-10 Pain Score: 3  Pain Location: R hip Pain Descriptors / Indicators: Aching;Sore Pain Intervention(s): Monitored during session;Repositioned;Ice applied    Home Living                      Prior Function            PT Goals (current goals can now be found in the care plan section)      Frequency  7X/week    PT Plan      Co-evaluation             End of Session Equipment Utilized During Treatment: Gait belt Activity Tolerance: Patient tolerated treatment well Patient left: in chair;with call bell/phone within reach     Time: 0915-1000 PT Time Calculation (min) (ACUTE ONLY): 45 min  Charges:  $Gait Training: 8-22 mins $Therapeutic Exercise: 8-22 mins $Therapeutic Activity: 8-22 mins  G Codes:      Rica Koyanagi  PTA [facility]  Acute  Rehab Pager      [phone]

## 2014-11-25 NOTE — Progress Notes (Signed)
OT Cancellation Note  Patient Details Name: Joel Medina MRN: [id number] DOB: 04-09-1961   Cancelled Treatment:    Reason Eval/Treat Not Completed: OT screened, no needs identified, will sign off Pt states he had his other hip done in 2014 but he doesn't feel he needs OT services at this time. He states he has all DME and wife present and can assist. Pt states he was up to the 3in1 earlier without difficulty also. Will sign off per pt request.  [name]  [phone] 11/25/2014, 10:39 AM

## 2014-11-25 NOTE — Discharge Instructions (Addendum)
Dr. Gaynelle Arabian Total Joint Specialist [facility] [address] [place], [place] [address] [phone]  ANTERIOR APPROACH TOTAL HIP REPLACEMENT POSTOPERATIVE DIRECTIONS   Hip Rehabilitation, Guidelines Following Surgery  The results of a hip operation are greatly improved after range of motion and muscle strengthening exercises. Follow all safety measures which are given to protect your hip. If any of these exercises cause increased pain or swelling in your joint, decrease the amount until you are comfortable again. Then slowly increase the exercises. Call your caregiver if you have problems or questions.   HOME CARE INSTRUCTIONS  Remove items at home which could result in a fall. This includes throw rugs or furniture in walking pathways.   ICE to the affected hip every three hours for 30 minutes at a time and then as needed for pain and swelling.  Continue to use ice on the hip for pain and swelling from surgery. You may notice swelling that will progress down to the foot and ankle.  This is normal after surgery.  Elevate the leg when you are not up walking on it.    Continue to use the breathing machine which will help keep your temperature down.  It is common for your temperature to cycle up and down following surgery, especially at night when you are not up moving around and exerting yourself.  The breathing machine keeps your lungs expanded and your temperature down.  Do not place pillow under knee, focus on keeping the knee straight while resting  DIET You may resume your previous home diet once your are discharged from the hospital.  DRESSING / WOUND CARE / SHOWERING You may shower 3 days after surgery, but keep the wounds dry during showering.  You may use an occlusive plastic wrap (Press'n Seal for example), NO SOAKING/SUBMERGING IN THE BATHTUB.  If the bandage gets wet, change with a clean dry gauze.  If the incision gets wet, pat the wound dry with  a clean towel. You may start showering once you are discharged home but do not submerge the incision under water. Just pat the incision dry and apply a dry gauze dressing on daily. Change the surgical dressing daily and reapply a dry dressing each time.  ACTIVITY Walk with your walker as instructed. Use walker as long as suggested by your caregivers. Avoid periods of inactivity such as sitting longer than an hour when not asleep. This helps prevent blood clots.  You may resume a sexual relationship in one month or when given the OK by your doctor.  You may return to work once you are cleared by your doctor.  Do not drive a car for 6 weeks or until released by you surgeon.  Do not drive while taking narcotics.  WEIGHT BEARING Weight bearing as tolerated with assist device (walker, cane, etc) as directed, use it as long as suggested by your surgeon or therapist, typically at least 4-6 weeks.  POSTOPERATIVE CONSTIPATION PROTOCOL Constipation - defined medically as fewer than three stools per week and severe constipation as less than one stool per week.  One of the most common issues patients have following surgery is constipation.  Even if you have a regular bowel pattern at home, your normal regimen is likely to be disrupted due to multiple reasons following surgery.  Combination of anesthesia, postoperative narcotics, change in appetite and fluid intake all can affect your bowels.  In order to avoid complications following surgery, here are some recommendations in order to  help you during your recovery period.  Colace (docusate) - Pick up an over-the-counter form of Colace or another stool softener and take twice a day as long as you are requiring postoperative pain medications.  Take with a full glass of water daily.  If you experience loose stools or diarrhea, hold the colace until you stool forms back up.  If your symptoms do not get better within 1 week or if they get worse, check with your  doctor.  Dulcolax (bisacodyl) - Pick up over-the-counter and take as directed by the product packaging as needed to assist with the movement of your bowels.  Take with a full glass of water.  Use this product as needed if not relieved by Colace only.   MiraLax (polyethylene glycol) - Pick up over-the-counter to have on hand.  MiraLax is a solution that will increase the amount of water in your bowels to assist with bowel movements.  Take as directed and can mix with a glass of water, juice, soda, coffee, or tea.  Take if you go more than two days without a movement. Do not use MiraLax more than once per day. Call your doctor if you are still constipated or irregular after using this medication for 7 days in a row.  If you continue to have problems with postoperative constipation, please contact the office for further assistance and recommendations.  If you experience "the worst abdominal pain ever" or develop nausea or vomiting, please contact the office immediatly for further recommendations for treatment.  ITCHING  If you experience itching with your medications, try taking only a single pain pill, or even half a pain pill at a time.  You can also use Benadryl over the counter for itching or also to help with sleep.   TED HOSE STOCKINGS Wear the elastic stockings on both legs for three weeks following surgery during the day but you may remove then at night for sleeping.  MEDICATIONS See your medication summary on the After Visit Summary that the nursing staff will review with you prior to discharge.  You may have some home medications which will be placed on hold until you complete the course of blood thinner medication.  It is important for you to complete the blood thinner medication as prescribed by your surgeon.  Continue your approved medications as instructed at time of discharge.  PRECAUTIONS If you experience chest pain or shortness of breath - call 911 immediately for transfer to the  hospital emergency department.  If you develop a fever greater that 101 F, purulent drainage from wound, increased redness or drainage from wound, foul odor from the wound/dressing, or calf pain - CONTACT YOUR SURGEON.                                                   FOLLOW-UP APPOINTMENTS Make sure you keep all of your appointments after your operation with your surgeon and caregivers. You should call the office at the above phone number and make an appointment for approximately two weeks after the date of your surgery or on the date instructed by your surgeon outlined in the "After Visit Summary".  RANGE OF MOTION AND STRENGTHENING EXERCISES  These exercises are designed to help you keep full movement of your hip joint. Follow your caregiver's or physical therapist's instructions. Perform all exercises about fifteen  times, three times per day or as directed. Exercise both hips, even if you have had only one joint replacement. These exercises can be done on a training (exercise) mat, on the floor, on a table or on a bed. Use whatever works the best and is most comfortable for you. Use music or television while you are exercising so that the exercises are a pleasant break in your day. This will make your life better with the exercises acting as a break in routine you can look forward to.  Lying on your back, slowly slide your foot toward your buttocks, raising your knee up off the floor. Then slowly slide your foot back down until your leg is straight again.  Lying on your back spread your legs as far apart as you can without causing discomfort.  Lying on your side, raise your upper leg and foot straight up from the floor as far as is comfortable. Slowly lower the leg and repeat.  Lying on your back, tighten up the muscle in the front of your thigh (quadriceps muscles). You can do this by keeping your leg straight and trying to raise your heel off the floor. This helps strengthen the largest muscle  supporting your knee.  Lying on your back, tighten up the muscles of your buttocks both with the legs straight and with the knee bent at a comfortable angle while keeping your heel on the floor.   IF YOU ARE TRANSFERRED TO A SKILLED REHAB FACILITY If the patient is transferred to a skilled rehab facility following release from the hospital, a list of the current medications will be sent to the facility for the patient to continue.  When discharged from the skilled rehab facility, please have the facility set up the patient's [place] prior to being released. Also, the skilled facility will be responsible for providing the patient with their medications at time of release from the facility to include their pain medication, the muscle relaxants, and their blood thinner medication. If the patient is still at the rehab facility at time of the two week follow up appointment, the skilled rehab facility will also need to assist the patient in arranging follow up appointment in our office and any transportation needs.  MAKE SURE YOU:  Understand these instructions.  Get help right away if you are not doing well or get worse.    Pick up stool softner and laxative for home use following surgery while on pain medications. Do not submerge incision under water. Please use good hand washing techniques while changing dressing each day. May shower starting three days after surgery. Please use a clean towel to pat the incision dry following showers. Continue to use ice for pain and swelling after surgery. Do not use any lotions or creams on the incision until instructed by your surgeon.  Take Xarelto for two and a half more weeks, then discontinue Xarelto. Once the patient has completed the blood thinner regimen, then take a Baby 81 mg Aspirin daily for three more weeks.  Information on my medicine - XARELTO (Rivaroxaban)  This medication education was reviewed with me or my healthcare  representative as part of my discharge preparation.  The pharmacist that spoke with me during my hospital stay was:  Emiliano Dyer, RPH  Why was Xarelto prescribed for you? Xarelto was prescribed for you to reduce the risk of blood clots forming after orthopedic surgery. The medical term for these abnormal blood clots is venous thromboembolism (VTE).  What do you need to know about xarelto ? Take your Xarelto ONCE DAILY at the same time every day. You may take it either with or without food.  If you have difficulty swallowing the tablet whole, you may crush it and mix in applesauce just prior to taking your dose.  Take Xarelto exactly as prescribed by your doctor and DO NOT stop taking Xarelto without talking to the doctor who prescribed the medication.  Stopping without other VTE prevention medication to take the place of Xarelto may increase your risk of developing a clot.  After discharge, you should have regular check-up appointments with your healthcare provider that is prescribing your Xarelto.    What do you do if you miss a dose? If you miss a dose, take it as soon as you remember on the same day then continue your regularly scheduled once daily regimen the next day. Do not take two doses of Xarelto on the same day.   Important Safety Information A possible side effect of Xarelto is bleeding. You should call your healthcare provider right away if you experience any of the following: ? Bleeding from an injury or your nose that does not stop. ? Unusual colored urine (red or dark brown) or unusual colored stools (red or black). ? Unusual bruising for unknown reasons. ? A serious fall or if you hit your head (even if there is no bleeding).  Some medicines may interact with Xarelto and might increase your risk of bleeding while on Xarelto. To help avoid this, consult your healthcare provider or pharmacist prior to using any new prescription or non-prescription  medications, including herbals, vitamins, non-steroidal anti-inflammatory drugs (NSAIDs) and supplements.  This website has more information on Xarelto: [email or web address]

## 2014-11-25 NOTE — Progress Notes (Signed)
   Subjective: 1 Day Post-Op Procedure(s) (LRB): RIGHT TOTAL HIP ARTHROPLASTY ANTERIOR APPROACH (Right) Patient reports pain as mild.   Patient seen in rounds with Dr. Wynelle Link. Sitting up in chair.  Had a decent night. Patient is well, and has had no acute complaints or problems Patient is ready to go home later this morning.  Objective: Vital signs in last 24 hours: Temp:  [97.5 F (36.4 C)-98.9 F (37.2 C)] 98.1 F (36.7 C) (06/09 0649) Pulse Rate:  [67-80] 70 (06/09 0649) Resp:  [15-19] 18 (06/09 0649) BP: (104-143)/(67-86) 114/67 mmHg (06/09 0649) SpO2:  [94 %-100 %] 96 % (06/09 0649) Weight:  [154.677 kg (341 lb)] 154.677 kg (341 lb) (06/08 1220)  Intake/Output from previous day:  Intake/Output Summary (Last 24 hours) at 11/25/14 0816 Last data filed at 11/25/14 0730  Gross per 24 hour  Intake   5215 ml  Output   2875 ml  Net   2340 ml    Intake/Output this shift: Total I/O In: -  Out: 100 [Urine:100]  Labs:  Recent Labs  11/25/14 0435  HGB 12.4*    Recent Labs  11/25/14 0435  WBC 12.3*  RBC 3.91*  HCT 35.6*  PLT 173    Recent Labs  11/25/14 0435  NA 134*  K 4.2  CL 102  CO2 23  BUN 16  CREATININE 0.77  GLUCOSE 209*  CALCIUM 8.4*   No results for input(s): LABPT, INR in the last 72 hours.  EXAM: General - Patient is Alert, Appropriate and Oriented Extremity - Neurovascular intact Sensation intact distally Dorsiflexion/Plantar flexion intact Incision - clean, dry, no drainage Motor Function - intact, moving foot and toes well on exam.   Assessment/Plan: 1 Day Post-Op Procedure(s) (LRB): RIGHT TOTAL HIP ARTHROPLASTY ANTERIOR APPROACH (Right) Procedure(s) (LRB): RIGHT TOTAL HIP ARTHROPLASTY ANTERIOR APPROACH (Right) Past Medical History  Diagnosis Date  . HTN (hypertension)   . Obesity   . History of pyelonephritis   . Arthritis     OA BOTH HIPS - PAIN IN LEFT HIP WORSE  . Diabetes mellitus without complication    Active  Problems:   OA (osteoarthritis) of hip  Estimated body mass index is 42.62 kg/(m^2) as calculated from the following:   Height as of this encounter: 6\' 3"  (1.905 m).   Weight as of this encounter: 154.677 kg (341 lb). Up with therapy Discharge home with home health Diet - Cardiac diet and Diabetic diet Follow up - in 2 weeks Activity - WBAT Disposition - Home Condition Upon Discharge - Good D/C Meds - See DC Summary DVT Prophylaxis - Xarelto  Arlee Muslim, PA-C Orthopaedic Surgery 11/25/2014, 8:16 AM

## 2014-12-01 ENCOUNTER — Encounter: Payer: Self-pay | Admitting: *Deleted

## 2014-12-01 NOTE — Care Management Note (Signed)
12/01/14 14:55 Charge RN notified me pt went home without having HHPT arranged on 11/25/14.  CM called pt to see if he wanted [facility] and asked for choice of home health therapy.  Pt chooses [facility] to render HHPT.  CM called [facility] rep, Kristen to please have Flora asap and Cyril Mourning states she will do her best.  NO other CM needs were communicated.

## 2015-04-08 ENCOUNTER — Encounter: Payer: Self-pay | Admitting: Internal Medicine

## 2015-04-08 ENCOUNTER — Ambulatory Visit (INDEPENDENT_AMBULATORY_CARE_PROVIDER_SITE_OTHER): Payer: Commercial Managed Care - HMO | Admitting: Internal Medicine

## 2015-04-08 ENCOUNTER — Encounter: Payer: Self-pay | Admitting: Gastroenterology

## 2015-04-08 ENCOUNTER — Other Ambulatory Visit (INDEPENDENT_AMBULATORY_CARE_PROVIDER_SITE_OTHER): Payer: Commercial Managed Care - HMO

## 2015-04-08 VITALS — BP 120/78 | HR 76 | Wt 337.0 lb

## 2015-04-08 DIAGNOSIS — I10 Essential (primary) hypertension: Secondary | ICD-10-CM

## 2015-04-08 DIAGNOSIS — R739 Hyperglycemia, unspecified: Secondary | ICD-10-CM

## 2015-04-08 DIAGNOSIS — E669 Obesity, unspecified: Secondary | ICD-10-CM

## 2015-04-08 LAB — BASIC METABOLIC PANEL
BUN: 11 mg/dL (ref 6–23)
CALCIUM: 9.5 mg/dL (ref 8.4–10.5)
CO2: 29 meq/L (ref 19–32)
Chloride: 100 mEq/L (ref 96–112)
Creatinine, Ser: 0.82 mg/dL (ref 0.40–1.50)
GFR: 104.17 mL/min (ref >60.00)
GLUCOSE: 158 mg/dL — AB (ref 70–99)
POTASSIUM: 3.8 meq/L (ref 3.5–5.1)
SODIUM: 136 meq/L (ref 135–145)

## 2015-04-08 LAB — HEMOGLOBIN A1C: HEMOGLOBIN A1C: 7.3 % — AB (ref 4.6–6.5)

## 2015-04-08 NOTE — Progress Notes (Signed)
Subjective:  Patient ID: Joel Medina, male    DOB: [birth year]  Age: 54 y.o. MRN: [id number]  CC: No chief complaint on file.   HPI [name] presents for OA, DM, HTN f/u  Outpatient Prescriptions Prior to Visit  Medication Sig Dispense Refill  . amLODipine (NORVASC) 10 MG tablet Take 1 tablet (10 mg total) by mouth daily. 90 tablet 3  . metFORMIN (GLUCOPHAGE) 1000 MG tablet Take 1 tablet (1,000 mg total) by mouth 2 (two) times daily with a meal. 180 tablet 3  . olmesartan-hydrochlorothiazide (BENICAR HCT) 40-25 MG per tablet Take 1 tablet by mouth every morning. 90 tablet 3  . potassium chloride (KLOR-CON 10) 10 MEQ tablet Take 1 tablet (10 mEq total) by mouth daily. 90 tablet 3  . furosemide (LASIX) 40 MG tablet Take 1 tablet (40 mg total) by mouth daily as needed for edema. (Patient not taking: Reported on 04/08/2015) 30 tablet 3  . ketoconazole (NIZORAL) 2 % cream Apply 1 application topically 2 (two) times daily. (Patient not taking: Reported on 04/08/2015) 30 g 1  . methocarbamol (ROBAXIN) 500 MG tablet Take 1 tablet (500 mg total) by mouth every 6 (six) hours as needed for muscle spasms. (Patient not taking: Reported on 04/08/2015) 80 tablet 0  . oxyCODONE (OXY IR/ROXICODONE) 5 MG immediate release tablet Take 1-2 tablets (5-10 mg total) by mouth every 3 (three) hours as needed for moderate pain, severe pain or breakthrough pain. (Patient not taking: Reported on 04/08/2015) 90 tablet 0  . rivaroxaban (XARELTO) 10 MG TABS tablet Take 1 tablet (10 mg total) by mouth daily with breakfast. Take Xarelto for two and a half more weeks, then discontinue Xarelto. Once the patient has completed the blood thinner regimen, then take a Baby 81 mg Aspirin daily for three more weeks. (Patient not taking: Reported on 04/08/2015) 20 tablet 0   No facility-administered medications prior to visit.    ROS Review of Systems  Constitutional: Negative for appetite change, fatigue and unexpected  weight change.  HENT: Negative for congestion, nosebleeds, sneezing, sore throat and trouble swallowing.   Eyes: Negative for itching and visual disturbance.  Respiratory: Negative for cough.   Cardiovascular: Negative for chest pain, palpitations and leg swelling.  Gastrointestinal: Negative for nausea, diarrhea, blood in stool and abdominal distention.  Genitourinary: Negative for frequency and hematuria.  Musculoskeletal: Negative for back pain, joint swelling, arthralgias, gait problem and neck pain.  Skin: Negative for rash.  Neurological: Negative for dizziness, tremors, speech difficulty and weakness.  Psychiatric/Behavioral: Negative for sleep disturbance, dysphoric mood and agitation. The patient is not nervous/anxious.     Objective:  BP 120/78 mmHg  Pulse 76  Wt 337 lb (152.862 kg)  SpO2 96%  BP Readings from Last 3 Encounters:  04/08/15 120/78  11/25/14 109/60  11/19/14 136/84    Wt Readings from Last 3 Encounters:  04/08/15 337 lb (152.862 kg)  11/24/14 341 lb (154.677 kg)  11/19/14 341 lb (154.677 kg)    Physical Exam  Constitutional: He is oriented to person, place, and time. He appears well-developed. No distress.  NAD  HENT:  Mouth/Throat: Oropharynx is clear and moist.  Eyes: Conjunctivae are normal. Pupils are equal, round, and reactive to light.  Neck: Normal range of motion. No JVD present. No thyromegaly present.  Cardiovascular: Normal rate, regular rhythm, normal heart sounds and intact distal pulses.  Exam reveals no gallop and no friction rub.   No murmur heard. Pulmonary/Chest: Effort normal and  breath sounds normal. No respiratory distress. He has no wheezes. He has no rales. He exhibits no tenderness.  Abdominal: Soft. Bowel sounds are normal. He exhibits no distension and no mass. There is no tenderness. There is no rebound and no guarding.  Musculoskeletal: Normal range of motion. He exhibits no edema or tenderness.  Lymphadenopathy:    He  has no cervical adenopathy.  Neurological: He is alert and oriented to person, place, and time. He has normal reflexes. No cranial nerve deficit. He exhibits normal muscle tone. He displays a negative Romberg sign. Coordination and gait normal.  Skin: Skin is warm and dry. No rash noted.  Psychiatric: He has a normal mood and affect. His behavior is normal. Judgment and thought content normal.  Obese Trace edema B  Lab Results  Component Value Date   WBC 12.3* 11/25/2014   HGB 12.4* 11/25/2014   HCT 35.6* 11/25/2014   PLT 173 11/25/2014   GLUCOSE 158* 04/08/2015   CHOL 140 10/05/2014   TRIG 155.0* 10/05/2014   HDL 31.00* 10/05/2014   LDLDIRECT 93.3 04/02/2014   LDLCALC 78 10/05/2014   ALT 23 11/19/2014   AST 25 11/19/2014   NA 136 04/08/2015   K 3.8 04/08/2015   CL 100 04/08/2015   CREATININE 0.82 04/08/2015   BUN 11 04/08/2015   CO2 29 04/08/2015   TSH 2.88 10/05/2014   PSA 0.78 10/05/2014   INR 1.08 11/19/2014   HGBA1C 7.3* 04/08/2015    No results found.  Assessment & Plan:   Diagnoses and all orders for this visit:  Essential hypertension -     Hemoglobin A1c; Future -     Basic metabolic panel; Future  Obesity  Hyperglycemia -     Hemoglobin A1c; Future -     Basic metabolic panel; Future  I have discontinued Mr. Marcon's methocarbamol, oxyCODONE, and rivaroxaban. I am also having him maintain his furosemide, ketoconazole, amLODipine, olmesartan-hydrochlorothiazide, potassium chloride, and metFORMIN.  No orders of the defined types were placed in this encounter.     Follow-up: Return in about 6 months (around 10/07/2015) for Wellness Exam.  Walker Kehr, MD

## 2015-04-08 NOTE — Progress Notes (Signed)
Pre visit review using our clinic review tool, if applicable. No additional management support is needed unless otherwise documented below in the visit note. 

## 2015-04-08 NOTE — Assessment & Plan Note (Signed)
On Metformin 

## 2015-04-08 NOTE — Assessment & Plan Note (Signed)
Benicar HCT, Amlodipine 

## 2015-04-08 NOTE — Assessment & Plan Note (Signed)
Better  

## 2015-05-27 ENCOUNTER — Ambulatory Visit (AMBULATORY_SURGERY_CENTER): Payer: Self-pay | Admitting: *Deleted

## 2015-05-27 VITALS — Ht 75.0 in | Wt 341.0 lb

## 2015-05-27 DIAGNOSIS — Z1211 Encounter for screening for malignant neoplasm of colon: Secondary | ICD-10-CM

## 2015-05-27 MED ORDER — SUPREP BOWEL PREP KIT 17.5-3.13-1.6 GM/177ML PO SOLN: 1.0000 | Freq: Once | ORAL | Status: DC

## 2015-05-27 NOTE — Progress Notes (Signed)
Patient denies any allergies to egg or soy products. Patient denies complications with anesthesia/sedation.  Patient denies oxygen use at home and denies diet medications. Emmi instructions for colonoscopy explained and given to patient.  

## 2015-05-30 ENCOUNTER — Encounter: Payer: Self-pay | Admitting: Gastroenterology

## 2015-06-10 ENCOUNTER — Ambulatory Visit (AMBULATORY_SURGERY_CENTER): Payer: Commercial Managed Care - HMO | Admitting: Gastroenterology

## 2015-06-10 ENCOUNTER — Encounter: Payer: Self-pay | Admitting: Gastroenterology

## 2015-06-10 VITALS — BP 138/98 | HR 71 | Temp 95.6°F | Resp 20 | Ht 75.0 in | Wt 341.0 lb

## 2015-06-10 DIAGNOSIS — D124 Benign neoplasm of descending colon: Secondary | ICD-10-CM | POA: Diagnosis not present

## 2015-06-10 DIAGNOSIS — Z1211 Encounter for screening for malignant neoplasm of colon: Secondary | ICD-10-CM | POA: Diagnosis present

## 2015-06-10 DIAGNOSIS — K635 Polyp of colon: Secondary | ICD-10-CM

## 2015-06-10 MED ORDER — SODIUM CHLORIDE 0.9 % IV SOLN: 500.0000 mL | INTRAVENOUS | Status: DC

## 2015-06-10 NOTE — Patient Instructions (Signed)
YOU HAD AN ENDOSCOPIC PROCEDURE TODAY AT [facility]:   Refer to the procedure report that was given to you for any specific questions about what was found during the examination.  If the procedure report does not answer your questions, please call your gastroenterologist to clarify.  If you requested that your care partner not be given the details of your procedure findings, then the procedure report has been included in a sealed envelope for you to review at your convenience later.  YOU SHOULD EXPECT: Some feelings of bloating in the abdomen. Passage of more gas than usual.  Walking can help get rid of the air that was put into your GI tract during the procedure and reduce the bloating. If you had a lower endoscopy (such as a colonoscopy or flexible sigmoidoscopy) you may notice spotting of blood in your stool or on the toilet paper. If you underwent a bowel prep for your procedure, you may not have a normal bowel movement for a few days.  Please Note:  You might notice some irritation and congestion in your nose or some drainage.  This is from the oxygen used during your procedure.  There is no need for concern and it should clear up in a day or so.  SYMPTOMS TO REPORT IMMEDIATELY:   Following lower endoscopy (colonoscopy or flexible sigmoidoscopy):  Excessive amounts of blood in the stool  Significant tenderness or worsening of abdominal pains  Swelling of the abdomen that is new, acute  Fever of 100F or higher  For urgent or emergent issues, a gastroenterologist can be reached at any hour by calling [phone].   DIET: Your first meal following the procedure should be a small meal and then it is ok to progress to your normal diet. Heavy or fried foods are harder to digest and may make you feel nauseous or bloated.  Likewise, meals heavy in dairy and vegetables can increase bloating.  Drink plenty of fluids but you should avoid alcoholic beverages for 24  hours.  ACTIVITY:  You should plan to take it easy for the rest of today and you should NOT DRIVE or use heavy machinery until tomorrow (because of the sedation medicines used during the test).    FOLLOW UP: Our staff will call the number listed on your records the next business day following your procedure to check on you and address any questions or concerns that you may have regarding the information given to you following your procedure. If we do not reach you, we will leave a message.  However, if you are feeling well and you are not experiencing any problems, there is no need to return our call.  We will assume that you have returned to your regular daily activities without incident.  If any biopsies were taken you will be contacted by phone or by letter within the next 1-3 weeks.  Please call us at [phone] if you have not heard about the biopsies in 3 weeks.    SIGNATURES/CONFIDENTIALITY: You and/or your care partner have signed paperwork which will be entered into your electronic medical record.  These signatures attest to the fact that that the information above on your After Visit Summary has been reviewed and is understood.  Full responsibility of the confidentiality of this discharge information lies with you and/or your care-partner.  Next colonoscopy determined by pathology results; 5 or 10 years. Please review polyp and hemorrhoid handouts provided.

## 2015-06-10 NOTE — Progress Notes (Signed)
To recovery, report to Myers, RN, VSS. 

## 2015-06-10 NOTE — Progress Notes (Signed)
Called to room to assist during endoscopic procedure.  Patient ID and intended procedure confirmed with present staff. Received instructions for my participation in the procedure from the performing physician.  

## 2015-06-10 NOTE — Op Note (Signed)
[facility]  [address] [place], [id number]   COLONOSCOPY PROCEDURE REPORT  PATIENT: Joel, Medina  MR#: [id number] BIRTHDATE: [birth year] , 17  yrs. old GENDER: male ENDOSCOPIST: Ladene Artist, MD, [facility] Regal REFERRED BY:  Creig Hines, M.D. PROCEDURE DATE:  06/10/2015 PROCEDURE:   Colonoscopy, screening and Colonoscopy with snare polypectomy First Screening Colonoscopy - Avg.  risk and is 50 yrs.  old or older Yes.  Prior Negative Screening - Now for repeat screening. N/A  History of Adenoma - Now for follow-up colonoscopy & has been > or = to 3 yrs.  N/A  Polyps removed today? Yes ASA CLASS:   Class II INDICATIONS:Screening for colonic neoplasia and Colorectal Neoplasm Risk Assessment for this procedure is average risk. MEDICATIONS: Monitored anesthesia care and Propofol 260 mg IV DESCRIPTION OF PROCEDURE:   After the risks benefits and alternatives of the procedure were thoroughly explained, informed consent was obtained.  The digital rectal exam revealed no abnormalities of the rectum.   The LB [id number]  endoscope was introduced through the anus and advanced to the cecum, which was identified by both the appendix and ileocecal valve. No adverse events experienced.   The quality of the prep was excellent. (Suprep was used)  The instrument was then slowly withdrawn as the colon was fully examined. Estimated blood loss is zero unless otherwise noted in this procedure report.    COLON FINDINGS: A sessile polyp measuring 6 mm in size was found in the descending colon.  A polypectomy was performed with a cold snare.  The resection was complete, the polyp tissue was completely retrieved and sent to histology.   The colonic mucosa appeared normal at the splenic flexure, in the transverse colon, rectum, sigmoid colon, at the ileocecal valve, cecum, hepatic flexure, and in the ascending colon.  Retroflexed views revealed internal Grade I hemorrhoids.  The time to cecum = 2.0 Withdrawal time = 11.1   The scope was withdrawn and the procedure completed. COMPLICATIONS: There were no immediate complications.  ENDOSCOPIC IMPRESSION: 1.   Sessile polyp in the descending colon; polypectomy performed with a cold snare 2.   Grade I internal hemorrhoids  RECOMMENDATIONS: 1.  Await pathology results 2.  Repeat colonoscopy in 5 years if polyp adenomatous; otherwise 10 years  eSigned:  Ladene Artist, MD, [facility] 06/10/2015 10:36 AM

## 2015-06-14 ENCOUNTER — Telehealth: Payer: Self-pay | Admitting: *Deleted

## 2015-06-14 NOTE — Telephone Encounter (Signed)
  Follow up Call-  Call back number 06/10/2015  Post procedure Call Back phone  # [phone]  Permission to leave phone message Yes    [facility]

## 2015-06-22 ENCOUNTER — Encounter: Payer: Self-pay | Admitting: Gastroenterology

## 2015-08-27 ENCOUNTER — Other Ambulatory Visit: Payer: Self-pay | Admitting: Internal Medicine

## 2015-09-28 ENCOUNTER — Ambulatory Visit (INDEPENDENT_AMBULATORY_CARE_PROVIDER_SITE_OTHER): Payer: Commercial Managed Care - HMO | Admitting: Internal Medicine

## 2015-09-28 ENCOUNTER — Encounter: Payer: Self-pay | Admitting: Internal Medicine

## 2015-09-28 ENCOUNTER — Other Ambulatory Visit (INDEPENDENT_AMBULATORY_CARE_PROVIDER_SITE_OTHER): Payer: Commercial Managed Care - HMO

## 2015-09-28 VITALS — BP 118/80 | HR 80 | Wt 335.0 lb

## 2015-09-28 DIAGNOSIS — R739 Hyperglycemia, unspecified: Secondary | ICD-10-CM | POA: Diagnosis not present

## 2015-09-28 DIAGNOSIS — I10 Essential (primary) hypertension: Secondary | ICD-10-CM

## 2015-09-28 DIAGNOSIS — Z0000 Encounter for general adult medical examination without abnormal findings: Secondary | ICD-10-CM | POA: Diagnosis not present

## 2015-09-28 DIAGNOSIS — M545 Low back pain, unspecified: Secondary | ICD-10-CM | POA: Insufficient documentation

## 2015-09-28 DIAGNOSIS — M79605 Pain in left leg: Secondary | ICD-10-CM

## 2015-09-28 LAB — HEPATIC FUNCTION PANEL
ALBUMIN: 4.2 g/dL (ref 3.5–5.2)
ALK PHOS: 61 U/L (ref 39–117)
ALT: 17 U/L (ref 0–53)
AST: 15 U/L (ref 0–37)
BILIRUBIN TOTAL: 0.8 mg/dL (ref 0.2–1.2)
Bilirubin, Direct: 0.2 mg/dL (ref 0.0–0.3)
Total Protein: 6.8 g/dL (ref 6.0–8.3)

## 2015-09-28 LAB — BASIC METABOLIC PANEL
BUN: 11 mg/dL (ref 6–23)
CHLORIDE: 102 meq/L (ref 96–112)
CO2: 28 mEq/L (ref 19–32)
Calcium: 9.5 mg/dL (ref 8.4–10.5)
Creatinine, Ser: 0.85 mg/dL (ref 0.40–1.50)
GFR: 99.76 mL/min (ref >60.00)
Glucose, Bld: 152 mg/dL — ABNORMAL HIGH (ref 70–99)
POTASSIUM: 4 meq/L (ref 3.5–5.1)
Sodium: 138 mEq/L (ref 135–145)

## 2015-09-28 LAB — URINALYSIS
Bilirubin Urine: NEGATIVE
HGB URINE DIPSTICK: NEGATIVE
Ketones, ur: NEGATIVE
Leukocytes, UA: NEGATIVE
Nitrite: NEGATIVE
Specific Gravity, Urine: 1.02 (ref 1.000–1.030)
TOTAL PROTEIN, URINE-UPE24: NEGATIVE
URINE GLUCOSE: NEGATIVE
UROBILINOGEN UA: 0.2 (ref 0.0–1.0)
pH: 6 (ref 5.0–8.0)

## 2015-09-28 LAB — HEPATITIS C ANTIBODY: HCV Ab: NEGATIVE

## 2015-09-28 LAB — CBC WITH DIFFERENTIAL/PLATELET
BASOS PCT: 0.5 % (ref 0.0–3.0)
Basophils Absolute: 0 10*3/uL (ref 0.0–0.1)
EOS PCT: 2.2 % (ref 0.0–5.0)
Eosinophils Absolute: 0.2 10*3/uL (ref 0.0–0.7)
HCT: 45.8 % (ref 39.0–52.0)
Hemoglobin: 16 g/dL (ref 13.0–17.0)
LYMPHS ABS: 2.6 10*3/uL (ref 0.7–4.0)
Lymphocytes Relative: 33.2 % (ref 12.0–46.0)
MCHC: 35 g/dL (ref 30.0–36.0)
MCV: 93.8 fl (ref 78.0–100.0)
Monocytes Absolute: 0.4 10*3/uL (ref 0.1–1.0)
Monocytes Relative: 5.7 % (ref 3.0–12.0)
NEUTROS ABS: 4.5 10*3/uL (ref 1.4–7.7)
NEUTROS PCT: 58.4 % (ref 43.0–77.0)
PLATELETS: 216 10*3/uL (ref 150.0–400.0)
RBC: 4.88 Mil/uL (ref 4.22–5.81)
RDW: 13.5 % (ref 11.5–15.5)
WBC: 7.7 10*3/uL (ref 4.0–10.5)

## 2015-09-28 LAB — LIPID PANEL
CHOL/HDL RATIO: 5
Cholesterol: 140 mg/dL (ref 0–200)
HDL: 29 mg/dL — ABNORMAL LOW (ref >39.00)
LDL Cholesterol: 76 mg/dL (ref 0–99)
NONHDL: 111.27
Triglycerides: 178 mg/dL — ABNORMAL HIGH (ref 0.0–149.0)
VLDL: 35.6 mg/dL (ref 0.0–40.0)

## 2015-09-28 LAB — TSH: TSH: 2.63 u[IU]/mL (ref 0.35–4.50)

## 2015-09-28 LAB — HEMOGLOBIN A1C: HEMOGLOBIN A1C: 6.8 % — AB (ref 4.6–6.5)

## 2015-09-28 LAB — PSA: PSA: 0.79 ng/mL (ref 0.10–4.00)

## 2015-09-28 MED ORDER — POTASSIUM CHLORIDE ER 10 MEQ PO TBCR: 10.0000 meq | EXTENDED_RELEASE_TABLET | Freq: Every day | ORAL | Status: DC

## 2015-09-28 MED ORDER — METFORMIN HCL 1000 MG PO TABS: 1000.0000 mg | ORAL_TABLET | Freq: Two times a day (BID) | ORAL | Status: DC

## 2015-09-28 MED ORDER — OLMESARTAN MEDOXOMIL-HCTZ 40-25 MG PO TABS: 1.0000 | ORAL_TABLET | Freq: Every morning | ORAL | Status: DC

## 2015-09-28 MED ORDER — AMLODIPINE BESYLATE 10 MG PO TABS: 10.0000 mg | ORAL_TABLET | Freq: Every day | ORAL | Status: DC

## 2015-09-28 NOTE — Assessment & Plan Note (Signed)
We discussed age appropriate health related issues, including available/recomended screening tests and vaccinations. We discussed a need for adhering to healthy diet and exercise. Labs/EKG were reviewed/ordered. All questions were answered. Colon 2016; due in 2026

## 2015-09-28 NOTE — Assessment & Plan Note (Signed)
Benicar HCT, Amlodipine 

## 2015-09-28 NOTE — Assessment & Plan Note (Signed)
4/17 L SI joint area vs sciatica Worse w/ROM

## 2015-09-28 NOTE — Progress Notes (Signed)
Subjective:  Patient ID: Joel Medina, male    DOB: 1961/03/30  Age: 55 y.o. MRN: [id number]  CC: No chief complaint on file.   HPI CRISTIANO AKKERMAN presents for a well exam C/o severe L LBP x 1 week  Outpatient Prescriptions Prior to Visit  Medication Sig Dispense Refill  . amLODipine (NORVASC) 10 MG tablet Take 1 tablet (10 mg total) by mouth daily. 90 tablet 3  . metFORMIN (GLUCOPHAGE) 1000 MG tablet Take 1 tablet (1,000 mg total) by mouth 2 (two) times daily with a meal. 180 tablet 3  . olmesartan-hydrochlorothiazide (BENICAR HCT) 40-25 MG tablet Take 1 tablet by mouth every morning. Yearly physical is due must see MD for refills 90 tablet 0  . potassium chloride (KLOR-CON 10) 10 MEQ tablet Take 1 tablet (10 mEq total) by mouth daily. 90 tablet 3   No facility-administered medications prior to visit.    ROS Review of Systems  Constitutional: Negative for appetite change, fatigue and unexpected weight change.  HENT: Negative for congestion, nosebleeds, sneezing, sore throat and trouble swallowing.   Eyes: Negative for itching and visual disturbance.  Respiratory: Negative for cough.   Cardiovascular: Negative for chest pain, palpitations and leg swelling.  Gastrointestinal: Negative for nausea, diarrhea, blood in stool and abdominal distention.  Genitourinary: Negative for frequency and hematuria.  Musculoskeletal: Negative for back pain, joint swelling, gait problem and neck pain.  Skin: Negative for rash.  Neurological: Negative for dizziness, tremors, speech difficulty and weakness.  Psychiatric/Behavioral: Negative for sleep disturbance, dysphoric mood and agitation. The patient is not nervous/anxious.   LBP  Objective:  BP 118/80 mmHg  Pulse 80  Wt 335 lb (151.955 kg)  SpO2 95%  BP Readings from Last 3 Encounters:  09/28/15 118/80  06/10/15 138/98  04/08/15 120/78    Wt Readings from Last 3 Encounters:  09/28/15 335 lb (151.955 kg)  06/10/15 341 lb  (154.677 kg)  05/27/15 341 lb (154.677 kg)    Physical Exam  Constitutional: He is oriented to person, place, and time. He appears well-developed. No distress.  NAD  HENT:  Mouth/Throat: Oropharynx is clear and moist.  Eyes: Conjunctivae are normal. Pupils are equal, round, and reactive to light.  Neck: Normal range of motion. No JVD present. No thyromegaly present.  Cardiovascular: Normal rate, regular rhythm, normal heart sounds and intact distal pulses.  Exam reveals no gallop and no friction rub.   No murmur heard. Pulmonary/Chest: Effort normal and breath sounds normal. No respiratory distress. He has no wheezes. He has no rales. He exhibits no tenderness.  Abdominal: Soft. Bowel sounds are normal. He exhibits no distension and no mass. There is no tenderness. There is no rebound and no guarding.  Genitourinary: Prostate normal.  Musculoskeletal: Normal range of motion. He exhibits no edema or tenderness.  Lymphadenopathy:    He has no cervical adenopathy.  Neurological: He is alert and oriented to person, place, and time. He has normal reflexes. No cranial nerve deficit. He exhibits normal muscle tone. He displays a negative Romberg sign. Coordination and gait normal.  Skin: Skin is warm and dry. No rash noted.  Psychiatric: He has a normal mood and affect. His behavior is normal. Judgment and thought content normal.  pt declined a rectal LS tender on L - L SI  Lab Results  Component Value Date   WBC 7.7 09/28/2015   HGB 16.0 09/28/2015   HCT 45.8 09/28/2015   PLT 216.0 09/28/2015   GLUCOSE 152*  09/28/2015   CHOL 140 09/28/2015   TRIG 178.0* 09/28/2015   HDL 29.00* 09/28/2015   LDLDIRECT 93.3 04/02/2014   LDLCALC 76 09/28/2015   ALT 17 09/28/2015   AST 15 09/28/2015   NA 138 09/28/2015   K 4.0 09/28/2015   CL 102 09/28/2015   CREATININE 0.85 09/28/2015   BUN 11 09/28/2015   CO2 28 09/28/2015   TSH 2.63 09/28/2015   PSA 0.79 09/28/2015   INR 1.08 11/19/2014    HGBA1C 6.8* 09/28/2015    No results found.  Assessment & Plan:   Diagnoses and all orders for this visit:  Well adult exam -     Basic metabolic panel; Future -     Hemoglobin A1c; Future -     Hepatic function panel; Future -     CBC with Differential/Platelet; Future -     TSH; Future -     PSA; Future -     Lipid panel; Future -     Hepatitis C antibody; Future -     Urinalysis; Future  Essential hypertension  LBP radiating to left leg  Hyperglycemia -     Hemoglobin A1c; Future  Other orders -     amLODipine (NORVASC) 10 MG tablet; Take 1 tablet (10 mg total) by mouth daily. -     metFORMIN (GLUCOPHAGE) 1000 MG tablet; Take 1 tablet (1,000 mg total) by mouth 2 (two) times daily with a meal. -     potassium chloride (KLOR-CON 10) 10 MEQ tablet; Take 1 tablet (10 mEq total) by mouth daily. -     olmesartan-hydrochlorothiazide (BENICAR HCT) 40-25 MG tablet; Take 1 tablet by mouth every morning.  I have changed Mr. Muha's olmesartan-hydrochlorothiazide. I am also having him maintain his amLODipine, metFORMIN, and potassium chloride.  Meds ordered this encounter  Medications  . amLODipine (NORVASC) 10 MG tablet    Sig: Take 1 tablet (10 mg total) by mouth daily.    Dispense:  90 tablet    Refill:  3  . metFORMIN (GLUCOPHAGE) 1000 MG tablet    Sig: Take 1 tablet (1,000 mg total) by mouth 2 (two) times daily with a meal.    Dispense:  180 tablet    Refill:  3  . potassium chloride (KLOR-CON 10) 10 MEQ tablet    Sig: Take 1 tablet (10 mEq total) by mouth daily.    Dispense:  90 tablet    Refill:  3  . olmesartan-hydrochlorothiazide (BENICAR HCT) 40-25 MG tablet    Sig: Take 1 tablet by mouth every morning.    Dispense:  90 tablet    Refill:  3     Follow-up: Return in about 6 months (around 03/29/2016) for a follow-up visit.  Walker Kehr, MD

## 2015-09-28 NOTE — Progress Notes (Signed)
Pre visit review using our clinic review tool, if applicable. No additional management support is needed unless otherwise documented below in the visit note. 

## 2015-09-28 NOTE — Assessment & Plan Note (Signed)
Labs

## 2015-10-10 ENCOUNTER — Ambulatory Visit: Payer: Commercial Managed Care - HMO | Admitting: Internal Medicine

## 2015-10-26 ENCOUNTER — Other Ambulatory Visit: Payer: Self-pay | Admitting: Internal Medicine

## 2015-12-08 ENCOUNTER — Other Ambulatory Visit: Payer: Self-pay | Admitting: *Deleted

## 2015-12-08 MED ORDER — OLMESARTAN MEDOXOMIL-HCTZ 40-25 MG PO TABS: 1.0000 | ORAL_TABLET | Freq: Every morning | ORAL | Status: DC

## 2015-12-08 NOTE — Telephone Encounter (Signed)
Left msg on triage stating need refill on BP med. Called pt back to verify name pt is needing Benicar inform will send to [facility].../lmb

## 2016-03-15 ENCOUNTER — Telehealth: Payer: Self-pay | Admitting: Internal Medicine

## 2016-03-15 ENCOUNTER — Other Ambulatory Visit: Payer: Self-pay | Admitting: Internal Medicine

## 2016-03-15 DIAGNOSIS — I10 Essential (primary) hypertension: Secondary | ICD-10-CM

## 2016-03-15 MED ORDER — HYDROCHLOROTHIAZIDE 25 MG PO TABS: 25.0000 mg | ORAL_TABLET | Freq: Every day | ORAL | 0 refills | Status: DC

## 2016-03-15 MED ORDER — TELMISARTAN 80 MG PO TABS: 80.0000 mg | ORAL_TABLET | Freq: Every day | ORAL | 0 refills | Status: DC

## 2016-03-15 NOTE — Telephone Encounter (Signed)
Please advise in PCP's absence. Thanks! 

## 2016-03-15 NOTE — Telephone Encounter (Signed)
Pt called stating olmesartan-hydrochlorothiazide (BENICAR HCT) 40-25 MG tablet is going to cost him $150 to get it refill. He was wondering if he can get generic or something cheaper. Please advise.   [facility] is current pharmacy.

## 2016-03-15 NOTE — Telephone Encounter (Signed)
Changed to less expensive generics. Only 30 days given. Please come in in the next few weeks for a blood pressure check.

## 2016-03-16 NOTE — Telephone Encounter (Signed)
Left detailed mess informing pt of below.  

## 2016-03-23 ENCOUNTER — Ambulatory Visit (INDEPENDENT_AMBULATORY_CARE_PROVIDER_SITE_OTHER): Payer: Commercial Managed Care - HMO | Admitting: Internal Medicine

## 2016-03-23 ENCOUNTER — Encounter: Payer: Self-pay | Admitting: Internal Medicine

## 2016-03-23 ENCOUNTER — Other Ambulatory Visit (INDEPENDENT_AMBULATORY_CARE_PROVIDER_SITE_OTHER): Payer: Commercial Managed Care - HMO

## 2016-03-23 VITALS — BP 132/88 | HR 66 | Temp 97.7°F | Wt 332.0 lb

## 2016-03-23 DIAGNOSIS — I10 Essential (primary) hypertension: Secondary | ICD-10-CM

## 2016-03-23 DIAGNOSIS — R739 Hyperglycemia, unspecified: Secondary | ICD-10-CM

## 2016-03-23 DIAGNOSIS — M25559 Pain in unspecified hip: Secondary | ICD-10-CM

## 2016-03-23 DIAGNOSIS — R600 Localized edema: Secondary | ICD-10-CM | POA: Diagnosis not present

## 2016-03-23 DIAGNOSIS — E1169 Type 2 diabetes mellitus with other specified complication: Secondary | ICD-10-CM | POA: Insufficient documentation

## 2016-03-23 DIAGNOSIS — E669 Obesity, unspecified: Secondary | ICD-10-CM | POA: Insufficient documentation

## 2016-03-23 LAB — BASIC METABOLIC PANEL
BUN: 16 mg/dL (ref 6–23)
CHLORIDE: 102 meq/L (ref 96–112)
CO2: 27 meq/L (ref 19–32)
CREATININE: 0.84 mg/dL (ref 0.40–1.50)
Calcium: 9.5 mg/dL (ref 8.4–10.5)
GFR: 100.95 mL/min (ref >60.00)
GLUCOSE: 117 mg/dL — AB (ref 70–99)
POTASSIUM: 3.9 meq/L (ref 3.5–5.1)
Sodium: 136 mEq/L (ref 135–145)

## 2016-03-23 LAB — HEMOGLOBIN A1C: Hgb A1c MFr Bld: 6.7 % — ABNORMAL HIGH (ref 4.6–6.5)

## 2016-03-23 MED ORDER — TRIAMTERENE-HCTZ 37.5-25 MG PO TABS: 1.0000 | ORAL_TABLET | Freq: Every day | ORAL | 3 refills | Status: DC

## 2016-03-23 NOTE — Assessment & Plan Note (Signed)
D/c Norvasc

## 2016-03-23 NOTE — Progress Notes (Signed)
Subjective:  Patient ID: Joel Medina, male    DOB: Oct 17, 1960  Age: 55 y.o. MRN: [id number]  CC: No chief complaint on file.   HPI Joel Medina presents for HTN, elevated glucose, OA  Outpatient Medications Prior to Visit  Medication Sig Dispense Refill  . amLODipine (NORVASC) 10 MG tablet Take 1 tablet (10 mg total) by mouth daily. 90 tablet 3  . hydrochlorothiazide (HYDRODIURIL) 25 MG tablet Take 1 tablet (25 mg total) by mouth daily. 30 tablet 0  . KLOR-CON 10 10 MEQ tablet TAKE 1 TABLET (10 MEQ TOTAL) BY MOUTH DAILY. 90 tablet 3  . metFORMIN (GLUCOPHAGE) 1000 MG tablet TAKE 1 TABLET (1,000 MG TOTAL) BY MOUTH 2 (TWO) TIMES DAILY WITH A MEAL. 180 tablet 3  . potassium chloride (KLOR-CON 10) 10 MEQ tablet Take 1 tablet (10 mEq total) by mouth daily. 90 tablet 3  . telmisartan (MICARDIS) 80 MG tablet Take 1 tablet (80 mg total) by mouth daily. 30 tablet 0  . amLODipine (NORVASC) 10 MG tablet TAKE 1 TABLET (10 MG TOTAL) BY MOUTH DAILY. 90 tablet 3  . metFORMIN (GLUCOPHAGE) 1000 MG tablet Take 1 tablet (1,000 mg total) by mouth 2 (two) times daily with a meal. 180 tablet 3   No facility-administered medications prior to visit.     ROS Review of Systems  Constitutional: Negative for appetite change, fatigue and unexpected weight change.  HENT: Negative for congestion, nosebleeds, sneezing, sore throat and trouble swallowing.   Eyes: Negative for itching and visual disturbance.  Respiratory: Negative for cough.   Cardiovascular: Positive for leg swelling. Negative for chest pain and palpitations.  Gastrointestinal: Negative for abdominal distention, blood in stool, diarrhea and nausea.  Genitourinary: Negative for frequency and hematuria.  Musculoskeletal: Positive for arthralgias and gait problem. Negative for back pain, joint swelling and neck pain.  Skin: Negative for rash.  Neurological: Negative for dizziness, tremors, speech difficulty and weakness.    Psychiatric/Behavioral: Negative for agitation, dysphoric mood and sleep disturbance. The patient is not nervous/anxious.     Objective:  BP 132/88   Pulse 66   Temp 97.7 F (36.5 C) (Oral)   Wt (!) 332 lb (150.6 kg)   SpO2 95%   BMI 41.50 kg/m   BP Readings from Last 3 Encounters:  03/23/16 132/88  09/28/15 118/80  06/10/15 (!) 138/98    Wt Readings from Last 3 Encounters:  03/23/16 (!) 332 lb (150.6 kg)  09/28/15 (!) 335 lb (152 kg)  06/10/15 (!) 341 lb (154.7 kg)    Physical Exam  Constitutional: He is oriented to person, place, and time. He appears well-developed. No distress.  NAD  HENT:  Mouth/Throat: Oropharynx is clear and moist.  Eyes: Conjunctivae are normal. Pupils are equal, round, and reactive to light.  Neck: Normal range of motion. No JVD present. No thyromegaly present.  Cardiovascular: Normal rate, regular rhythm, normal heart sounds and intact distal pulses.  Exam reveals no gallop and no friction rub.   No murmur heard. Pulmonary/Chest: Effort normal and breath sounds normal. No respiratory distress. He has no wheezes. He has no rales. He exhibits no tenderness.  Abdominal: Soft. Bowel sounds are normal. He exhibits no distension and no mass. There is no tenderness. There is no rebound and no guarding.  Musculoskeletal: Normal range of motion. He exhibits no edema or tenderness.  Lymphadenopathy:    He has no cervical adenopathy.  Neurological: He is alert and oriented to person, place, and time. He  has normal reflexes. No cranial nerve deficit. He exhibits normal muscle tone. He displays a negative Romberg sign. Coordination and gait normal.  Skin: Skin is warm and dry. No rash noted.  Psychiatric: He has a normal mood and affect. His behavior is normal. Judgment and thought content normal.  trace ankle edema R shoulder is tender w/ROM  Lab Results  Component Value Date   WBC 7.7 09/28/2015   HGB 16.0 09/28/2015   HCT 45.8 09/28/2015   PLT 216.0  09/28/2015   GLUCOSE 152 (H) 09/28/2015   CHOL 140 09/28/2015   TRIG 178.0 (H) 09/28/2015   HDL 29.00 (L) 09/28/2015   LDLDIRECT 93.3 04/02/2014   LDLCALC 76 09/28/2015   ALT 17 09/28/2015   AST 15 09/28/2015   NA 138 09/28/2015   K 4.0 09/28/2015   CL 102 09/28/2015   CREATININE 0.85 09/28/2015   BUN 11 09/28/2015   CO2 28 09/28/2015   TSH 2.63 09/28/2015   PSA 0.79 09/28/2015   INR 1.08 11/19/2014   HGBA1C 6.8 (H) 09/28/2015    No results found.  Assessment & Plan:   There are no diagnoses linked to this encounter. I am having Mr. Iwanicki maintain his amLODipine, potassium chloride, KLOR-CON 10, metFORMIN, hydrochlorothiazide, and telmisartan.  No orders of the defined types were placed in this encounter.    Follow-up: No Follow-up on file.  Walker Kehr, MD

## 2016-03-23 NOTE — Progress Notes (Signed)
Pre visit review using our clinic review tool, if applicable. No additional management support is needed unless otherwise documented below in the visit note. 

## 2016-03-23 NOTE — Assessment & Plan Note (Signed)
S/p B THR 

## 2016-03-23 NOTE — Assessment & Plan Note (Signed)
Off Benicar HCT due to $$$; on Micardis, Maxzide

## 2016-03-23 NOTE — Assessment & Plan Note (Signed)
On Metformin Labs 

## 2016-03-29 ENCOUNTER — Ambulatory Visit: Payer: Commercial Managed Care - HMO | Admitting: Internal Medicine

## 2016-05-05 ENCOUNTER — Other Ambulatory Visit: Payer: Self-pay | Admitting: Internal Medicine

## 2016-05-05 DIAGNOSIS — I10 Essential (primary) hypertension: Secondary | ICD-10-CM

## 2016-06-20 ENCOUNTER — Telehealth: Payer: Self-pay | Admitting: Internal Medicine

## 2016-06-20 NOTE — Telephone Encounter (Signed)
Pt called in and said that he is having some side effects from one of the meds that he is on and would like to talk with nurse    Best number [phone]

## 2016-06-22 NOTE — Telephone Encounter (Signed)
I called pt- he states since end of November he has kept a mild HA, productive cough and per his wife some of his words are slurred. He thinks some of these symptoms are side effects of Triam/HCTZ. Please advise.

## 2016-06-23 NOTE — Telephone Encounter (Signed)
These are not side effects from Triamt/HCTZ Pls sch OV Thx

## 2016-06-25 NOTE — Telephone Encounter (Signed)
Pt informed. OV scheduled 06/29/16.

## 2016-06-29 ENCOUNTER — Ambulatory Visit (INDEPENDENT_AMBULATORY_CARE_PROVIDER_SITE_OTHER): Payer: Commercial Managed Care - HMO | Admitting: Internal Medicine

## 2016-06-29 ENCOUNTER — Other Ambulatory Visit (INDEPENDENT_AMBULATORY_CARE_PROVIDER_SITE_OTHER): Payer: Commercial Managed Care - HMO

## 2016-06-29 ENCOUNTER — Encounter: Payer: Self-pay | Admitting: Internal Medicine

## 2016-06-29 DIAGNOSIS — E1169 Type 2 diabetes mellitus with other specified complication: Secondary | ICD-10-CM

## 2016-06-29 DIAGNOSIS — R519 Headache, unspecified: Secondary | ICD-10-CM | POA: Insufficient documentation

## 2016-06-29 DIAGNOSIS — G4489 Other headache syndrome: Secondary | ICD-10-CM

## 2016-06-29 DIAGNOSIS — R4781 Slurred speech: Secondary | ICD-10-CM | POA: Diagnosis not present

## 2016-06-29 DIAGNOSIS — E669 Obesity, unspecified: Secondary | ICD-10-CM | POA: Diagnosis not present

## 2016-06-29 DIAGNOSIS — R51 Headache: Secondary | ICD-10-CM

## 2016-06-29 LAB — BASIC METABOLIC PANEL
BUN: 12 mg/dL (ref 6–23)
CALCIUM: 9.7 mg/dL (ref 8.4–10.5)
CHLORIDE: 101 meq/L (ref 96–112)
CO2: 27 mEq/L (ref 19–32)
CREATININE: 0.79 mg/dL (ref 0.40–1.50)
GFR: 108.25 mL/min (ref >60.00)
Glucose, Bld: 192 mg/dL — ABNORMAL HIGH (ref 70–99)
Potassium: 3.9 mEq/L (ref 3.5–5.1)
Sodium: 134 mEq/L — ABNORMAL LOW (ref 135–145)

## 2016-06-29 LAB — CBC WITH DIFFERENTIAL/PLATELET
BASOS ABS: 0 10*3/uL (ref 0.0–0.1)
Basophils Relative: 0.6 % (ref 0.0–3.0)
EOS ABS: 0.1 10*3/uL (ref 0.0–0.7)
Eosinophils Relative: 1.5 % (ref 0.0–5.0)
HCT: 44 % (ref 39.0–52.0)
HEMOGLOBIN: 15.6 g/dL (ref 13.0–17.0)
LYMPHS PCT: 38.5 % (ref 12.0–46.0)
Lymphs Abs: 3.2 10*3/uL (ref 0.7–4.0)
MCHC: 35.4 g/dL (ref 30.0–36.0)
MCV: 92.2 fl (ref 78.0–100.0)
MONO ABS: 0.6 10*3/uL (ref 0.1–1.0)
Monocytes Relative: 7.2 % (ref 3.0–12.0)
Neutro Abs: 4.4 10*3/uL (ref 1.4–7.7)
Neutrophils Relative %: 52.2 % (ref 43.0–77.0)
Platelets: 199 10*3/uL (ref 150.0–400.0)
RBC: 4.77 Mil/uL (ref 4.22–5.81)
RDW: 13.4 % (ref 11.5–15.5)
WBC: 8.4 10*3/uL (ref 4.0–10.5)

## 2016-06-29 LAB — HEMOGLOBIN A1C: Hgb A1c MFr Bld: 7.7 % — ABNORMAL HIGH (ref 4.6–6.5)

## 2016-06-29 LAB — TSH: TSH: 3.26 u[IU]/mL (ref 0.35–4.50)

## 2016-06-29 LAB — HEPATIC FUNCTION PANEL
ALT: 24 U/L (ref 0–53)
AST: 18 U/L (ref 0–37)
Albumin: 4.2 g/dL (ref 3.5–5.2)
Alkaline Phosphatase: 60 U/L (ref 39–117)
BILIRUBIN DIRECT: 0.1 mg/dL (ref 0.0–0.3)
BILIRUBIN TOTAL: 0.7 mg/dL (ref 0.2–1.2)
Total Protein: 7.1 g/dL (ref 6.0–8.3)

## 2016-06-29 LAB — SEDIMENTATION RATE: SED RATE: 8 mm/h (ref 0–20)

## 2016-06-29 LAB — VITAMIN B12: VITAMIN B 12: 399 pg/mL (ref 211–911)

## 2016-06-29 NOTE — Progress Notes (Signed)
Pre visit review using our clinic review tool, if applicable. No additional management support is needed unless otherwise documented below in the visit note. 

## 2016-06-29 NOTE — Assessment & Plan Note (Signed)
Labs

## 2016-06-29 NOTE — Progress Notes (Signed)
Subjective:  Patient ID: Joel Medina, male    DOB: 05-Aug-1960  Age: 56 y.o. MRN: [id number]  CC: Fall (back in November while doing yard work. He stepped bakwards and tripped over a bush and hit his head on the ground. He was able to get up quickly. ); Aphasia (per his wife during December); and Cough (he feels like from his Triam/HCTZ)   HPI Joel Medina presents for a low grade HA, fatigue, speech slurred - can't find words. He fell over a bush in Nov 2017 tripped over a bush and hit his head (no LOC).   Outpatient Medications Prior to Visit  Medication Sig Dispense Refill  . KLOR-CON 10 10 MEQ tablet TAKE 1 TABLET (10 MEQ TOTAL) BY MOUTH DAILY. 90 tablet 3  . metFORMIN (GLUCOPHAGE) 1000 MG tablet TAKE 1 TABLET (1,000 MG TOTAL) BY MOUTH 2 (TWO) TIMES DAILY WITH A MEAL. 180 tablet 3  . potassium chloride (KLOR-CON 10) 10 MEQ tablet Take 1 tablet (10 mEq total) by mouth daily. 90 tablet 3  . triamterene-hydrochlorothiazide (MAXZIDE-25) 37.5-25 MG tablet Take 1 tablet by mouth daily. 90 tablet 3  . telmisartan (MICARDIS) 80 MG tablet Take 1 tablet (80 mg total) by mouth daily. (Patient not taking: Reported on 06/29/2016) 30 tablet 0   No facility-administered medications prior to visit.     ROS Review of Systems  Constitutional: Negative for appetite change, fatigue and unexpected weight change.  HENT: Negative for congestion, nosebleeds, sneezing, sore throat and trouble swallowing.   Eyes: Negative for itching and visual disturbance.  Respiratory: Negative for cough.   Cardiovascular: Negative for chest pain, palpitations and leg swelling.  Gastrointestinal: Negative for abdominal distention, blood in stool, diarrhea and nausea.  Genitourinary: Negative for frequency and hematuria.  Musculoskeletal: Negative for back pain, gait problem, joint swelling and neck pain.  Skin: Negative for rash.  Neurological: Positive for speech difficulty and headaches. Negative for dizziness,  tremors and weakness.  Psychiatric/Behavioral: Negative for agitation, dysphoric mood and sleep disturbance. The patient is not nervous/anxious.     Objective:  BP 138/80   Pulse 75   Wt (!) 343 lb (155.6 kg)   SpO2 97%   BMI 42.87 kg/m   BP Readings from Last 3 Encounters:  06/29/16 138/80  03/23/16 132/88  09/28/15 118/80    Wt Readings from Last 3 Encounters:  06/29/16 (!) 343 lb (155.6 kg)  03/23/16 (!) 332 lb (150.6 kg)  09/28/15 (!) 335 lb (152 kg)    Physical Exam  Constitutional: He is oriented to person, place, and time. He appears well-developed. No distress.  NAD  HENT:  Mouth/Throat: Oropharynx is clear and moist.  Eyes: Conjunctivae are normal. Pupils are equal, round, and reactive to light.  Neck: Normal range of motion. No JVD present. No thyromegaly present.  Cardiovascular: Normal rate, regular rhythm, normal heart sounds and intact distal pulses.  Exam reveals no gallop and no friction rub.   No murmur heard. Pulmonary/Chest: Effort normal and breath sounds normal. No respiratory distress. He has no wheezes. He has no rales. He exhibits no tenderness.  Abdominal: Soft. Bowel sounds are normal. He exhibits no distension and no mass. There is no tenderness. There is no rebound and no guarding.  Musculoskeletal: Normal range of motion. He exhibits no edema or tenderness.  Lymphadenopathy:    He has no cervical adenopathy.  Neurological: He is alert and oriented to person, place, and time. He has normal reflexes. No cranial nerve  deficit. He exhibits normal muscle tone. He displays a negative Romberg sign. Coordination and gait normal.  Skin: Skin is warm and dry. No rash noted.  Psychiatric: His behavior is normal. Judgment and thought content normal.    Lab Results  Component Value Date   WBC 7.7 09/28/2015   HGB 16.0 09/28/2015   HCT 45.8 09/28/2015   PLT 216.0 09/28/2015   GLUCOSE 117 (H) 03/23/2016   CHOL 140 09/28/2015   TRIG 178.0 (H) 09/28/2015    HDL 29.00 (L) 09/28/2015   LDLDIRECT 93.3 04/02/2014   LDLCALC 76 09/28/2015   ALT 17 09/28/2015   AST 15 09/28/2015   NA 136 03/23/2016   K 3.9 03/23/2016   CL 102 03/23/2016   CREATININE 0.84 03/23/2016   BUN 16 03/23/2016   CO2 27 03/23/2016   TSH 2.63 09/28/2015   PSA 0.79 09/28/2015   INR 1.08 11/19/2014   HGBA1C 6.7 (H) 03/23/2016    No results found.  Assessment & Plan:   There are no diagnoses linked to this encounter. I am having Mr. Walczak maintain his potassium chloride, KLOR-CON 10, metFORMIN, telmisartan, and triamterene-hydrochlorothiazide.  No orders of the defined types were placed in this encounter.    Follow-up: No Follow-up on file.  Walker Kehr, MD

## 2016-06-29 NOTE — Assessment & Plan Note (Signed)
Hed MRI

## 2016-06-29 NOTE — Assessment & Plan Note (Signed)
MRI  Labs

## 2016-07-01 ENCOUNTER — Encounter: Payer: Self-pay | Admitting: Internal Medicine

## 2016-07-02 ENCOUNTER — Telehealth: Payer: Self-pay | Admitting: Internal Medicine

## 2016-07-02 DIAGNOSIS — R4781 Slurred speech: Secondary | ICD-10-CM

## 2016-07-02 DIAGNOSIS — G4489 Other headache syndrome: Secondary | ICD-10-CM

## 2016-07-02 NOTE — Telephone Encounter (Signed)
[facility] needs you to give them a call and change an order. Please give them a call.

## 2016-07-03 NOTE — Telephone Encounter (Signed)
I called [facility]. I spoke to Powell. She states the brain MR order needs to be changed to W / Wo. Order changed.

## 2016-07-03 NOTE — Addendum Note (Signed)
Addended by: Cresenciano Lick on: 07/03/2016 04:54 PM   Modules accepted: Orders

## 2016-07-06 ENCOUNTER — Other Ambulatory Visit: Payer: Self-pay | Admitting: Internal Medicine

## 2016-07-16 ENCOUNTER — Ambulatory Visit
Admission: RE | Admit: 2016-07-16 | Discharge: 2016-07-16 | Disposition: A | Discharge status: Home / Self Care | Payer: Commercial Managed Care - HMO | Source: Ambulatory Visit | Attending: Internal Medicine | Admitting: Internal Medicine

## 2016-07-16 DIAGNOSIS — R4781 Slurred speech: Secondary | ICD-10-CM

## 2016-07-16 DIAGNOSIS — G4489 Other headache syndrome: Secondary | ICD-10-CM

## 2016-07-16 DIAGNOSIS — R51 Headache: Secondary | ICD-10-CM | POA: Diagnosis not present

## 2016-07-16 MED ORDER — GADOBENATE DIMEGLUMINE 529 MG/ML IV SOLN
20.0000 mL | Freq: Once | INTRAVENOUS | Status: AC | PRN
  Administered 2016-07-16: 20 mL via INTRAVENOUS

## 2016-07-26 ENCOUNTER — Encounter: Payer: Self-pay | Admitting: Internal Medicine

## 2016-07-26 ENCOUNTER — Ambulatory Visit (INDEPENDENT_AMBULATORY_CARE_PROVIDER_SITE_OTHER): Payer: Commercial Managed Care - HMO | Admitting: Internal Medicine

## 2016-07-26 VITALS — BP 136/76 | HR 103 | Resp 20 | Wt 337.0 lb

## 2016-07-26 DIAGNOSIS — I10 Essential (primary) hypertension: Secondary | ICD-10-CM

## 2016-07-26 DIAGNOSIS — E1169 Type 2 diabetes mellitus with other specified complication: Secondary | ICD-10-CM

## 2016-07-26 DIAGNOSIS — E6609 Other obesity due to excess calories: Secondary | ICD-10-CM

## 2016-07-26 DIAGNOSIS — R4781 Slurred speech: Secondary | ICD-10-CM

## 2016-07-26 DIAGNOSIS — IMO0001 Reserved for inherently not codable concepts without codable children: Secondary | ICD-10-CM

## 2016-07-26 DIAGNOSIS — R7309 Other abnormal glucose: Secondary | ICD-10-CM | POA: Diagnosis not present

## 2016-07-26 DIAGNOSIS — E669 Obesity, unspecified: Secondary | ICD-10-CM

## 2016-07-26 MED ORDER — TELMISARTAN 80 MG PO TABS: 80.0000 mg | ORAL_TABLET | Freq: Every day | ORAL | 3 refills | Status: DC

## 2016-07-26 MED ORDER — TRIAMTERENE-HCTZ 37.5-25 MG PO TABS: 1.0000 | ORAL_TABLET | Freq: Every day | ORAL | 3 refills | Status: DC

## 2016-07-26 MED ORDER — METFORMIN HCL 1000 MG PO TABS: ORAL_TABLET | ORAL | 3 refills | Status: DC

## 2016-07-26 NOTE — Assessment & Plan Note (Signed)
We discussed A1c elevation: the pt wants to try wt loss and diet x 3 mo longer (w/Metformin)

## 2016-07-26 NOTE — Assessment & Plan Note (Signed)
Wt Readings from Last 3 Encounters:  07/26/16 (!) 337 lb (152.9 kg)  06/29/16 (!) 343 lb (155.6 kg)  03/23/16 (!) 332 lb (150.6 kg)

## 2016-07-26 NOTE — Assessment & Plan Note (Signed)
No relapsed Brain MRI - nl

## 2016-07-26 NOTE — Progress Notes (Signed)
Subjective:  Patient ID: Joel Medina, male    DOB: 10/02/1960  Age: 56 y.o. MRN: [id number]  CC: No chief complaint on file.   HPI Joel Medina presents for a remote fall, DM, HTN f/u  Outpatient Medications Prior to Visit  Medication Sig Dispense Refill  . KLOR-CON 10 10 MEQ tablet TAKE 1 TABLET (10 MEQ TOTAL) BY MOUTH DAILY. 90 tablet 3  . metFORMIN (GLUCOPHAGE) 1000 MG tablet TAKE 1 TABLET (1,000 MG TOTAL) BY MOUTH 2 (TWO) TIMES DAILY WITH A MEAL. 180 tablet 3  . potassium chloride (KLOR-CON 10) 10 MEQ tablet Take 1 tablet (10 mEq total) by mouth daily. 90 tablet 3  . telmisartan (MICARDIS) 80 MG tablet Take 1 tablet (80 mg total) by mouth daily. 30 tablet 0  . triamterene-hydrochlorothiazide (MAXZIDE-25) 37.5-25 MG tablet Take 1 tablet by mouth daily. 90 tablet 3   No facility-administered medications prior to visit.     ROS Review of Systems  Constitutional: Negative for appetite change, fatigue and unexpected weight change.  HENT: Negative for congestion, nosebleeds, sneezing, sore throat and trouble swallowing.   Eyes: Negative for itching and visual disturbance.  Respiratory: Negative for cough.   Cardiovascular: Negative for chest pain, palpitations and leg swelling.  Gastrointestinal: Negative for abdominal distention, blood in stool, diarrhea and nausea.  Genitourinary: Negative for frequency and hematuria.  Musculoskeletal: Positive for gait problem. Negative for back pain, joint swelling and neck pain.  Skin: Negative for rash.  Neurological: Negative for dizziness, tremors, speech difficulty and weakness.  Psychiatric/Behavioral: Negative for agitation, dysphoric mood and sleep disturbance. The patient is not nervous/anxious.     Objective:  BP 136/76   Pulse (!) 103   Resp 20   Wt (!) 337 lb (152.9 kg)   SpO2 93%   BMI 42.12 kg/m   BP Readings from Last 3 Encounters:  07/26/16 136/76  06/29/16 138/80  03/23/16 132/88    Wt Readings from Last 3  Encounters:  07/26/16 (!) 337 lb (152.9 kg)  06/29/16 (!) 343 lb (155.6 kg)  03/23/16 (!) 332 lb (150.6 kg)    Physical Exam  Constitutional: He is oriented to person, place, and time. He appears well-developed. No distress.  NAD  HENT:  Mouth/Throat: Oropharynx is clear and moist.  Eyes: Conjunctivae are normal. Pupils are equal, round, and reactive to light.  Neck: Normal range of motion. No JVD present. No thyromegaly present.  Cardiovascular: Normal rate, regular rhythm, normal heart sounds and intact distal pulses.  Exam reveals no gallop and no friction rub.   No murmur heard. Pulmonary/Chest: Effort normal and breath sounds normal. No respiratory distress. He has no wheezes. He has no rales. He exhibits no tenderness.  Abdominal: Soft. Bowel sounds are normal. He exhibits no distension and no mass. There is no tenderness. There is no rebound and no guarding.  Musculoskeletal: Normal range of motion. He exhibits no edema or tenderness.  Lymphadenopathy:    He has no cervical adenopathy.  Neurological: He is alert and oriented to person, place, and time. He has normal reflexes. No cranial nerve deficit. He exhibits normal muscle tone. He displays a negative Romberg sign. Coordination abnormal. Gait normal.  Skin: Skin is warm and dry. No rash noted.  Psychiatric: He has a normal mood and affect. His behavior is normal. Judgment and thought content normal.  s/p B THR  Lab Results  Component Value Date   WBC 8.4 06/29/2016   HGB 15.6 06/29/2016  HCT 44.0 06/29/2016   PLT 199.0 06/29/2016   GLUCOSE 192 (H) 06/29/2016   CHOL 140 09/28/2015   TRIG 178.0 (H) 09/28/2015   HDL 29.00 (L) 09/28/2015   LDLDIRECT 93.3 04/02/2014   LDLCALC 76 09/28/2015   ALT 24 06/29/2016   AST 18 06/29/2016   NA 134 (L) 06/29/2016   K 3.9 06/29/2016   CL 101 06/29/2016   CREATININE 0.79 06/29/2016   BUN 12 06/29/2016   CO2 27 06/29/2016   TSH 3.26 06/29/2016   PSA 0.79 09/28/2015   INR 1.08  11/19/2014   HGBA1C 7.7 (H) 06/29/2016    Mr Brain W Wo Contrast  Result Date: 07/16/2016 CLINICAL DATA:  56 y/o M; mild headache and slurring of speech since fall 04/2016 and injury to right-sided head. EXAM: MRI HEAD WITHOUT AND WITH CONTRAST TECHNIQUE: Multiplanar, multiecho pulse sequences of the brain and surrounding structures were obtained without and with intravenous contrast. CONTRAST:  46mL MULTIHANCE GADOBENATE DIMEGLUMINE 529 MG/ML IV SOLN COMPARISON:  None. FINDINGS: Brain: No acute infarction, hemorrhage, hydrocephalus, extra-axial collection or mass lesion. Incidental prominent perivascular spaces in the left lateral frontal and right parietal white matter. No abnormal susceptibility hypointensity to suggest hemosiderin deposition. Single punctate focus of T2 FLAIR hyperintensity within the right frontal subcortical white matter is of unlikely clinical significance. No abnormal enhancement. Vascular: Normal flow voids. Skull and upper cervical spine: Normal marrow signal. Sinuses/Orbits: Trace right mastoid effusion. Mucosal thickening within the sphenoid sinus, maxillary sinuses, and anterior ethmoid air cells. Orbits are unremarkable. Other: None. IMPRESSION: No evidence for cortical contusion or diffuse axonal injury. Unremarkable MRI of the head with and without contrast for age. Electronically Signed   By: Joel Medina M.D.   On: 07/16/2016 15:52    Assessment & Plan:   There are no diagnoses linked to this encounter. I am having Joel Medina maintain his potassium chloride, KLOR-CON 10, metFORMIN, telmisartan, and triamterene-hydrochlorothiazide.  No orders of the defined types were placed in this encounter.    Follow-up: No Follow-up on file.  Joel Kehr, MD

## 2016-07-26 NOTE — Progress Notes (Signed)
Pre visit review using our clinic review tool, if applicable. No additional management support is needed unless otherwise documented below in the visit note. 

## 2016-10-01 ENCOUNTER — Other Ambulatory Visit: Payer: Self-pay | Admitting: Internal Medicine

## 2016-10-01 ENCOUNTER — Other Ambulatory Visit (INDEPENDENT_AMBULATORY_CARE_PROVIDER_SITE_OTHER): Payer: Commercial Managed Care - HMO

## 2016-10-01 ENCOUNTER — Ambulatory Visit (INDEPENDENT_AMBULATORY_CARE_PROVIDER_SITE_OTHER): Payer: Commercial Managed Care - HMO | Admitting: Internal Medicine

## 2016-10-01 ENCOUNTER — Encounter: Payer: Self-pay | Admitting: Internal Medicine

## 2016-10-01 VITALS — BP 148/90 | HR 76 | Temp 98.1°F | Ht 75.0 in | Wt 338.0 lb

## 2016-10-01 DIAGNOSIS — Z23 Encounter for immunization: Secondary | ICD-10-CM

## 2016-10-01 DIAGNOSIS — I10 Essential (primary) hypertension: Secondary | ICD-10-CM | POA: Diagnosis not present

## 2016-10-01 DIAGNOSIS — E1169 Type 2 diabetes mellitus with other specified complication: Secondary | ICD-10-CM

## 2016-10-01 DIAGNOSIS — Z0000 Encounter for general adult medical examination without abnormal findings: Secondary | ICD-10-CM

## 2016-10-01 DIAGNOSIS — R600 Localized edema: Secondary | ICD-10-CM | POA: Diagnosis not present

## 2016-10-01 DIAGNOSIS — E669 Obesity, unspecified: Secondary | ICD-10-CM

## 2016-10-01 DIAGNOSIS — Z6841 Body Mass Index (BMI) 40.0 and over, adult: Secondary | ICD-10-CM

## 2016-10-01 LAB — URINALYSIS
Bilirubin Urine: NEGATIVE
HGB URINE DIPSTICK: NEGATIVE
Ketones, ur: NEGATIVE
LEUKOCYTES UA: NEGATIVE
Nitrite: NEGATIVE
Specific Gravity, Urine: 1.02 (ref 1.000–1.030)
Total Protein, Urine: NEGATIVE
URINE GLUCOSE: NEGATIVE
UROBILINOGEN UA: 0.2 (ref 0.0–1.0)
pH: 5.5 (ref 5.0–8.0)

## 2016-10-01 LAB — CBC WITH DIFFERENTIAL/PLATELET
Basophils Absolute: 0 10*3/uL (ref 0.0–0.1)
Basophils Relative: 0.7 % (ref 0.0–3.0)
EOS ABS: 0.1 10*3/uL (ref 0.0–0.7)
EOS PCT: 1.1 % (ref 0.0–5.0)
HCT: 45.5 % (ref 39.0–52.0)
HEMOGLOBIN: 15.9 g/dL (ref 13.0–17.0)
Lymphocytes Relative: 34.9 % (ref 12.0–46.0)
Lymphs Abs: 2.4 10*3/uL (ref 0.7–4.0)
MCHC: 34.9 g/dL (ref 30.0–36.0)
MCV: 93.7 fl (ref 78.0–100.0)
MONO ABS: 0.4 10*3/uL (ref 0.1–1.0)
Monocytes Relative: 5.8 % (ref 3.0–12.0)
Neutro Abs: 3.9 10*3/uL (ref 1.4–7.7)
Neutrophils Relative %: 57.5 % (ref 43.0–77.0)
Platelets: 196 10*3/uL (ref 150.0–400.0)
RBC: 4.86 Mil/uL (ref 4.22–5.81)
RDW: 13.9 % (ref 11.5–15.5)
WBC: 6.8 10*3/uL (ref 4.0–10.5)

## 2016-10-01 LAB — HEPATIC FUNCTION PANEL
ALK PHOS: 64 U/L (ref 39–117)
ALT: 20 U/L (ref 0–53)
AST: 17 U/L (ref 0–37)
Albumin: 4.2 g/dL (ref 3.5–5.2)
BILIRUBIN DIRECT: 0.2 mg/dL (ref 0.0–0.3)
BILIRUBIN TOTAL: 1 mg/dL (ref 0.2–1.2)
Total Protein: 6.7 g/dL (ref 6.0–8.3)

## 2016-10-01 LAB — BASIC METABOLIC PANEL
BUN: 11 mg/dL (ref 6–23)
CALCIUM: 9.3 mg/dL (ref 8.4–10.5)
CO2: 24 mEq/L (ref 19–32)
Chloride: 102 mEq/L (ref 96–112)
Creatinine, Ser: 0.85 mg/dL (ref 0.40–1.50)
GFR: 99.39 mL/min (ref >60.00)
GLUCOSE: 197 mg/dL — AB (ref 70–99)
POTASSIUM: 4.4 meq/L (ref 3.5–5.1)
Sodium: 135 mEq/L (ref 135–145)

## 2016-10-01 LAB — HEMOGLOBIN A1C: HEMOGLOBIN A1C: 7.4 % — AB (ref 4.6–6.5)

## 2016-10-01 LAB — TSH: TSH: 2.6 u[IU]/mL (ref 0.35–4.50)

## 2016-10-01 LAB — LIPID PANEL
CHOLESTEROL: 133 mg/dL (ref 0–200)
HDL: 32.4 mg/dL — AB (ref >39.00)
LDL Cholesterol: 67 mg/dL (ref 0–99)
NonHDL: 100.58
TRIGLYCERIDES: 169 mg/dL — AB (ref 0.0–149.0)
Total CHOL/HDL Ratio: 4
VLDL: 33.8 mg/dL (ref 0.0–40.0)

## 2016-10-01 LAB — PSA: PSA: 2.2 ng/mL (ref 0.10–4.00)

## 2016-10-01 LAB — MICROALBUMIN / CREATININE URINE RATIO
CREATININE, U: 117.2 mg/dL
MICROALB UR: 1 mg/dL (ref 0.0–1.9)
Microalb Creat Ratio: 0.9 mg/g (ref 0.0–30.0)

## 2016-10-01 MED ORDER — TRIAMTERENE-HCTZ 37.5-25 MG PO TABS: 1.0000 | ORAL_TABLET | Freq: Every day | ORAL | 3 refills | Status: DC

## 2016-10-01 MED ORDER — REPAGLINIDE 1 MG PO TABS: 1.0000 mg | ORAL_TABLET | Freq: Three times a day (TID) | ORAL | 11 refills | Status: DC

## 2016-10-01 NOTE — Assessment & Plan Note (Signed)
Wt Readings from Last 3 Encounters:  10/01/16 (!) 338 lb (153.3 kg)  07/26/16 (!) 337 lb (152.9 kg)  06/29/16 (!) 343 lb (155.6 kg)

## 2016-10-01 NOTE — Assessment & Plan Note (Signed)
We discussed age appropriate health related issues, including available/recomended screening tests and vaccinations. We discussed a need for adhering to healthy diet and exercise. Labs were ordered. All questions were answered. Shingrix

## 2016-10-01 NOTE — Assessment & Plan Note (Signed)
Metformin Labs 

## 2016-10-01 NOTE — Assessment & Plan Note (Signed)
Worse Re-start Maxzide

## 2016-10-01 NOTE — Assessment & Plan Note (Signed)
Re-start Maxzide Cont w/others

## 2016-10-01 NOTE — Addendum Note (Signed)
Addended by: Karren Cobble on: 10/01/2016 10:24 AM   Modules accepted: Orders

## 2016-10-01 NOTE — Progress Notes (Signed)
Subjective:  Patient ID: Joel Medina, male    DOB: Nov 24, 1960  Age: 56 y.o. MRN: [id number]  CC: No chief complaint on file.   HPI Joel Medina presents for a well exam F/u HTN - maxzide was not filled...  Outpatient Medications Prior to Visit  Medication Sig Dispense Refill  . metFORMIN (GLUCOPHAGE) 1000 MG tablet TAKE 1 TABLET (1,000 MG TOTAL) BY MOUTH 2 (TWO) TIMES DAILY WITH A MEAL. 180 tablet 3  . telmisartan (MICARDIS) 80 MG tablet Take 1 tablet (80 mg total) by mouth daily. 90 tablet 3  . triamterene-hydrochlorothiazide (MAXZIDE-25) 37.5-25 MG tablet Take 1 tablet by mouth daily. (Patient not taking: Reported on 10/01/2016) 90 tablet 3   No facility-administered medications prior to visit.     ROS Review of Systems  Constitutional: Negative for appetite change, fatigue and unexpected weight change.  HENT: Negative for congestion, nosebleeds, sneezing, sore throat and trouble swallowing.   Eyes: Negative for itching and visual disturbance.  Respiratory: Negative for cough.   Cardiovascular: Negative for chest pain, palpitations and leg swelling.  Gastrointestinal: Negative for abdominal distention, blood in stool, diarrhea and nausea.  Genitourinary: Negative for frequency and hematuria.  Musculoskeletal: Positive for arthralgias. Negative for back pain, gait problem, joint swelling and neck pain.  Skin: Negative for rash.  Neurological: Negative for dizziness, tremors, speech difficulty and weakness.  Psychiatric/Behavioral: Negative for agitation, dysphoric mood and sleep disturbance. The patient is not nervous/anxious.     Objective:  BP (!) 148/90 (BP Location: Left Arm, Patient Position: Sitting, Cuff Size: Large)   Pulse 76   Temp 98.1 F (36.7 C) (Oral)   Ht 6\' 3"  (1.905 m)   Wt (!) 338 lb (153.3 kg)   SpO2 99%   BMI 42.25 kg/m   BP Readings from Last 3 Encounters:  10/01/16 (!) 148/90  07/26/16 136/76  06/29/16 138/80    Wt Readings from Last 3  Encounters:  10/01/16 (!) 338 lb (153.3 kg)  07/26/16 (!) 337 lb (152.9 kg)  06/29/16 (!) 343 lb (155.6 kg)    Physical Exam  Constitutional: He is oriented to person, place, and time. He appears well-developed and well-nourished. No distress.  HENT:  Head: Normocephalic and atraumatic.  Right Ear: External ear normal.  Left Ear: External ear normal.  Nose: Nose normal.  Mouth/Throat: Oropharynx is clear and moist. No oropharyngeal exudate.  Eyes: Conjunctivae and EOM are normal. Pupils are equal, round, and reactive to light. Right eye exhibits no discharge. Left eye exhibits no discharge. No scleral icterus.  Neck: Normal range of motion. Neck supple. No JVD present. No tracheal deviation present. No thyromegaly present.  Cardiovascular: Normal rate, regular rhythm, normal heart sounds and intact distal pulses.  Exam reveals no gallop and no friction rub.   No murmur heard. Pulmonary/Chest: Effort normal and breath sounds normal. No stridor. No respiratory distress. He has no wheezes. He has no rales. He exhibits no tenderness.  Abdominal: Soft. Bowel sounds are normal. He exhibits no distension and no mass. There is no tenderness. There is no rebound and no guarding.  Genitourinary: Rectum normal, prostate normal and penis normal. Rectal exam shows guaiac negative stool. No penile tenderness.  Musculoskeletal: Normal range of motion. He exhibits no edema or tenderness.  Lymphadenopathy:    He has no cervical adenopathy.  Neurological: He is alert and oriented to person, place, and time. He has normal reflexes. No cranial nerve deficit. He exhibits normal muscle tone. Coordination normal.  Skin: Skin is warm and dry. No rash noted. He is not diaphoretic. No erythema. No pallor.  Psychiatric: He has a normal mood and affect. His behavior is normal. Judgment and thought content normal.  Obese Trace edema B ankles  Lab Results  Component Value Date   WBC 8.4 06/29/2016   HGB 15.6  06/29/2016   HCT 44.0 06/29/2016   PLT 199.0 06/29/2016   GLUCOSE 192 (H) 06/29/2016   CHOL 140 09/28/2015   TRIG 178.0 (H) 09/28/2015   HDL 29.00 (L) 09/28/2015   LDLDIRECT 93.3 04/02/2014   LDLCALC 76 09/28/2015   ALT 24 06/29/2016   AST 18 06/29/2016   NA 134 (L) 06/29/2016   K 3.9 06/29/2016   CL 101 06/29/2016   CREATININE 0.79 06/29/2016   BUN 12 06/29/2016   CO2 27 06/29/2016   TSH 3.26 06/29/2016   PSA 0.79 09/28/2015   INR 1.08 11/19/2014   HGBA1C 7.7 (H) 06/29/2016    Mr Brain W Wo Contrast  Result Date: 07/16/2016 CLINICAL DATA:  56 y/o M; mild headache and slurring of speech since fall 04/2016 and injury to right-sided head. EXAM: MRI HEAD WITHOUT AND WITH CONTRAST TECHNIQUE: Multiplanar, multiecho pulse sequences of the brain and surrounding structures were obtained without and with intravenous contrast. CONTRAST:  18mL MULTIHANCE GADOBENATE DIMEGLUMINE 529 MG/ML IV SOLN COMPARISON:  None. FINDINGS: Brain: No acute infarction, hemorrhage, hydrocephalus, extra-axial collection or mass lesion. Incidental prominent perivascular spaces in the left lateral frontal and right parietal white matter. No abnormal susceptibility hypointensity to suggest hemosiderin deposition. Single punctate focus of T2 FLAIR hyperintensity within the right frontal subcortical white matter is of unlikely clinical significance. No abnormal enhancement. Vascular: Normal flow voids. Skull and upper cervical spine: Normal marrow signal. Sinuses/Orbits: Trace right mastoid effusion. Mucosal thickening within the sphenoid sinus, maxillary sinuses, and anterior ethmoid air cells. Orbits are unremarkable. Other: None. IMPRESSION: No evidence for cortical contusion or diffuse axonal injury. Unremarkable MRI of the head with and without contrast for age. Electronically Signed   By: Kristine Garbe M.D.   On: 07/16/2016 15:52    Assessment & Plan:   There are no diagnoses linked to this encounter. I  am having [name] maintain his triamterene-hydrochlorothiazide, telmisartan, and metFORMIN.  No orders of the defined types were placed in this encounter.    Follow-up: No Follow-up on file.  Walker Kehr, MD

## 2016-10-01 NOTE — Progress Notes (Signed)
Pre visit review using our clinic review tool, if applicable. No additional management support is needed unless otherwise documented below in the visit note. 

## 2016-10-25 ENCOUNTER — Ambulatory Visit: Payer: Commercial Managed Care - HMO | Admitting: Internal Medicine

## 2016-12-06 ENCOUNTER — Encounter (INDEPENDENT_AMBULATORY_CARE_PROVIDER_SITE_OTHER): Payer: Self-pay | Admitting: Family Medicine

## 2016-12-27 ENCOUNTER — Encounter (INDEPENDENT_AMBULATORY_CARE_PROVIDER_SITE_OTHER): Payer: Self-pay | Admitting: Family Medicine

## 2016-12-27 ENCOUNTER — Ambulatory Visit (INDEPENDENT_AMBULATORY_CARE_PROVIDER_SITE_OTHER): Payer: 59 | Admitting: Family Medicine

## 2016-12-27 VITALS — BP 128/80 | HR 72 | Temp 97.7°F | Resp 14 | Ht 75.0 in | Wt 327.0 lb

## 2016-12-27 DIAGNOSIS — Z794 Long term (current) use of insulin: Secondary | ICD-10-CM

## 2016-12-27 DIAGNOSIS — E669 Obesity, unspecified: Secondary | ICD-10-CM

## 2016-12-27 DIAGNOSIS — E119 Type 2 diabetes mellitus without complications: Secondary | ICD-10-CM | POA: Diagnosis not present

## 2016-12-27 DIAGNOSIS — Z1389 Encounter for screening for other disorder: Secondary | ICD-10-CM | POA: Diagnosis not present

## 2016-12-27 DIAGNOSIS — IMO0001 Reserved for inherently not codable concepts without codable children: Secondary | ICD-10-CM

## 2016-12-27 DIAGNOSIS — I10 Essential (primary) hypertension: Secondary | ICD-10-CM | POA: Diagnosis not present

## 2016-12-27 DIAGNOSIS — R0602 Shortness of breath: Secondary | ICD-10-CM | POA: Diagnosis not present

## 2016-12-27 DIAGNOSIS — R5383 Other fatigue: Secondary | ICD-10-CM | POA: Diagnosis not present

## 2016-12-27 DIAGNOSIS — R011 Cardiac murmur, unspecified: Secondary | ICD-10-CM | POA: Diagnosis not present

## 2016-12-27 DIAGNOSIS — Z1331 Encounter for screening for depression: Secondary | ICD-10-CM

## 2016-12-27 DIAGNOSIS — Z6841 Body Mass Index (BMI) 40.0 and over, adult: Secondary | ICD-10-CM

## 2016-12-27 DIAGNOSIS — Z0289 Encounter for other administrative examinations: Secondary | ICD-10-CM

## 2016-12-27 NOTE — Progress Notes (Signed)
Office: [phone]  /  Fax: [phone]   Dear Dr. Alain Marion,   Thank you for referring Joel Medina to our clinic. The following note includes my evaluation and treatment recommendations.  HPI:   Chief Complaint: OBESITY  Joel Medina has been referred by Joel Lacks. Plotnikov, MD for consultation regarding his obesity and obesity related comorbidities.  Joel Medina (MR# [id number]) is a 56 y.o. male who presents on 12/27/2016 for obesity evaluation and treatment. Current BMI is Body mass index is 40.87 kg/m.Marland Kitchen Joel Medina has struggled with obesity for years and has been unsuccessful in either losing weight or maintaining long term weight loss. Joel Medina attended our information session and states he is currently in the action stage of change and ready to dedicate time achieving and maintaining a healthier weight.  Joel Medina states he thinks his family will eat healthier with  him his desired weight loss is 100 lbs he has been heavy most of  his life he started gaining weight when he got married at age 83 his heaviest weight ever was 348 lbs. he has significant food cravings issues  he snacks frequently in the evenings he skips meals frequently he is frequently drinking liquids with calories he frequently eats larger portions than normal  he has binge eating behaviors he struggles with emotional eating    Fatigue Joel Medina feels his energy is lower than it should be. This has worsened with weight gain and has not worsened recently. Joel Medina admits to daytime somnolence and  admits to waking up still tired. Patient is at risk for obstructive sleep apnea. Patent has a history of symptoms of daytime fatigue, morning fatigue and hypertension. Patient generally gets 5 or 6 hours of sleep per night, and states they generally have restless sleep. Snoring is present. Apneic episodes are present. Epworth Sleepiness Score is 8  Dyspnea on exertion Joel Medina notes increasing shortness of breath with  exercising and seems to be worsening over time with weight gain. He notes getting out of breath sooner with activity than he used to. This has not gotten worse recently. Joel Medina denies orthopnea.  New Onset Cardiac Murmur Joel Medina has a early grade 1/6 systolic murmur, a history of hypertension and likely sleep apnea. He denies chest pain at rest or with exercise.  Hypertension Joel Medina is a 56 y.o. male with hypertension. His blood pressure is stable on medications.  Joel Medina denies chest pain or headache. He is working weight loss to help control his blood pressure with the goal of decreasing his risk of heart attack and stroke. Joel Medina blood pressure is currently controlled.  Diabetes II Joel Medina has a diagnosis of diabetes type II and is on metformin and prandin. Joel Medina denies diabetes and states he has pre-diabetes but previous A1c indicates uncontrolled type 2 diabetes. He is attempting to work on intensive lifestyle modifications including diet, exercise, and weight loss to help control his blood glucose levels.   Depression Screen Joel Medina Food and Mood (modified PHQ-9) score was  Depression screen PHQ 2/9 12/27/2016  Decreased Interest 3  Down, Depressed, Hopeless 0  PHQ - 2 Score 3  Altered sleeping 3  Tired, decreased energy 3  Change in appetite 3  Feeling bad or failure about yourself  0  Trouble concentrating 1  Moving slowly or fidgety/restless 1  Suicidal thoughts 0  PHQ-9 Score 14    ALLERGIES: Allergies  Allergen Reactions  . Amlodipine     Edema with 10 mg/d    MEDICATIONS:  Current Outpatient Prescriptions on File Prior to Visit  Medication Sig Dispense Refill  . metFORMIN (GLUCOPHAGE) 1000 MG tablet TAKE 1 TABLET (1,000 MG TOTAL) BY MOUTH 2 (TWO) TIMES DAILY WITH A MEAL. 180 tablet 3  . repaglinide (PRANDIN) 1 MG tablet Take 1 tablet (1 mg total) by mouth 3 (three) times daily before meals. 90 tablet 11  . telmisartan (MICARDIS) 80 MG tablet Take 1  tablet (80 mg total) by mouth daily. 90 tablet 3  . triamterene-hydrochlorothiazide (MAXZIDE-25) 37.5-25 MG tablet Take 1 tablet by mouth daily. 90 tablet 3   No current facility-administered medications on file prior to visit.     PAST MEDICAL HISTORY: Past Medical History:  Diagnosis Date  . Arthritis    OA BOTH HIPS - PAIN IN LEFT HIP WORSE  . Back pain   . Diabetes mellitus without complication (HCC)    oral meds  . History of pyelonephritis   . HTN (hypertension)   . Joint pain   . Obesity   . Prediabetes   . Swelling    feet and legs    PAST SURGICAL HISTORY: Past Surgical History:  Procedure Laterality Date  . [place]  . RIGHT ULNAR NERVE SURGERY TO REMOVE SCAR TISSUE  ? 2006  . TOTAL HIP ARTHROPLASTY Left 02/04/2013   Procedure: LEFT TOTAL HIP ARTHROPLASTY;  Surgeon: [name], MD;  Location: [facility];  Service: Orthopedics;  Laterality: Left;  . TOTAL HIP ARTHROPLASTY Right 11/24/2014   Procedure: RIGHT TOTAL HIP ARTHROPLASTY ANTERIOR APPROACH;  Surgeon: Gaynelle Arabian, MD;  Location: [facility];  Service: Orthopedics;  Laterality: Right;  . WISDOM TEETH EXTRACTIONS  ? 54  . WISDOM TOOTH EXTRACTION      SOCIAL HISTORY: Social History  Substance Use Topics  . Smoking status: Never Smoker  . Smokeless tobacco: Never Used  . Alcohol use 7.2 oz/week    12 Cans of beer per week    FAMILY HISTORY: Family History  Problem Relation Age of Onset  . Hypertension Father   . Obesity Father   . Hypertension Mother   . Cancer Mother   . Depression Mother   . Obesity Mother   . Hypertension Other   . Colon cancer Neg Hx   . Colon polyps Neg Hx   . Esophageal cancer Neg Hx   . Rectal cancer Neg Hx   . Stomach cancer Neg Hx     ROS: Review of Systems  Constitutional: Positive for malaise/fatigue.  HENT: Positive for congestion (nasal stuffiness).        Dry Mouth (morning)  Eyes:       Wear Glasses or Contacts  Respiratory: Positive for  cough and shortness of breath (on exertion).   Cardiovascular: Negative for chest pain (at rest or on exertion) and orthopnea.  Musculoskeletal: Positive for back pain and joint pain (joint or muscle pain).  Skin:       dryness  Neurological: Negative for headaches.  Psychiatric/Behavioral:       Stress    PHYSICAL EXAM: Blood pressure 128/80, pulse 72, temperature 97.7 F (36.5 C), temperature source Oral, resp. rate 14, height 6\' 3"  (1.905 m), weight (!) 327 lb (148.3 kg), SpO2 96 %. Body mass index is 40.87 kg/m. Physical Exam  Constitutional: He is oriented to person, place, and time. He appears well-developed and well-nourished.  Cardiovascular:  Murmur (grade 1/6 early systolic murmur) heard. Pulmonary/Chest: Effort normal.  Musculoskeletal: Normal range of motion.  Neurological: He  is oriented to person, place, and time.  Skin: Skin is warm and dry.  Psychiatric: He has a normal mood and affect. His behavior is normal.  Vitals reviewed.   RECENT LABS AND TESTS: BMET    Component Value Date/Time   NA 135 10/01/2016 1027   K 4.4 10/01/2016 1027   CL 102 10/01/2016 1027   CO2 24 10/01/2016 1027   GLUCOSE 197 (H) 10/01/2016 1027   BUN 11 10/01/2016 1027   CREATININE 0.85 10/01/2016 1027   CALCIUM 9.3 10/01/2016 1027   GFRNONAA >60 11/25/2014 0435   GFRAA >60 11/25/2014 0435   Lab Results  Component Value Date   HGBA1C 7.4 (H) 10/01/2016   No results found for: INSULIN CBC    Component Value Date/Time   WBC 6.8 10/01/2016 1027   RBC 4.86 10/01/2016 1027   HGB 15.9 10/01/2016 1027   HCT 45.5 10/01/2016 1027   PLT 196.0 10/01/2016 1027   MCV 93.7 10/01/2016 1027   MCH 31.7 11/25/2014 0435   MCHC 34.9 10/01/2016 1027   RDW 13.9 10/01/2016 1027   LYMPHSABS 2.4 10/01/2016 1027   MONOABS 0.4 10/01/2016 1027   EOSABS 0.1 10/01/2016 1027   BASOSABS 0.0 10/01/2016 1027   Iron/TIBC/Ferritin/ %Sat No results found for: IRON, TIBC, FERRITIN, IRONPCTSAT Lipid  Panel     Component Value Date/Time   CHOL 133 10/01/2016 1027   TRIG 169.0 (H) 10/01/2016 1027   HDL 32.40 (L) 10/01/2016 1027   CHOLHDL 4 10/01/2016 1027   VLDL 33.8 10/01/2016 1027   LDLCALC 67 10/01/2016 1027   LDLDIRECT 93.3 04/02/2014 1632   Hepatic Function Panel     Component Value Date/Time   PROT 6.7 10/01/2016 1027   ALBUMIN 4.2 10/01/2016 1027   AST 17 10/01/2016 1027   ALT 20 10/01/2016 1027   ALKPHOS 64 10/01/2016 1027   BILITOT 1.0 10/01/2016 1027   BILIDIR 0.2 10/01/2016 1027      Component Value Date/Time   TSH 2.60 10/01/2016 1027   TSH 3.26 06/29/2016 1205   TSH 2.63 09/28/2015 0900    ECG  shows NSR with a rate of 75 BPM INDIRECT CALORIMETER done today shows a VO2 of 294 and a REE of 2050.    ASSESSMENT AND PLAN: Other fatigue - Plan: EKG 12-Lead, Comprehensive metabolic panel, CBC with Differential/Platelet, Lipid Panel With LDL/HDL Ratio, VITAMIN D 25 Hydroxy (Vit-D Deficiency, Fractures), Vitamin B12, Folate, TSH, T4, free, T3  SOB (shortness of breath)  Type 2 diabetes mellitus without complication, with long-term current use of insulin (HCC) - Plan: Hemoglobin A1c, Insulin, random  Essential hypertension  Murmur, heart - New Onset  Depression screening  Class 3 obesity with serious comorbidity and body mass index (BMI) of 40.0 to 44.9 in adult, unspecified obesity type [facility])  PLAN: Fatigue Joel Medina was informed that his fatigue may be related to obesity, depression or many other causes. Labs will be ordered, and in the meanwhile Joel Medina has agreed to work on diet, exercise and weight loss to help with fatigue. Proper sleep hygiene was discussed including the need for 7-8 hours of quality sleep each night. A sleep study was not ordered based on symptoms and Epworth score.  Dyspnea on exertion Joel Medina's shortness of breath appears to be obesity related and exercise induced. He has agreed to work on weight loss and gradually increase exercise  to treat his exercise induced shortness of breath. If Joel Medina follows our instructions and loses weight without improvement of his shortness of  breath, we will plan to refer to pulmonology. We will monitor this condition regularly. Joel Medina agrees to this plan.  New Onset Cardiac Murmur We will refer to [facility] for echocardiogram (referred 12/27/16) and will follow..  Hypertension We discussed sodium restriction, working on healthy weight loss, and a regular exercise program as the means to achieve improved blood pressure control. Joel Medina agreed with this plan and agreed to follow up as directed. We will check labs and continue to monitor his blood pressure as well as his progress with the above lifestyle modifications. He will continue his medications as prescribed and will watch for signs of hypotension as he continues his lifestyle modifications.   Diabetes II Joel Medina has been given extensive diabetes education by myself today including ideal fasting and post-prandial blood glucose readings, individual ideal Hgb A1c goals  and hypoglycemia prevention. We discussed the importance of good blood sugar control to decrease the likelihood of diabetic complications such as nephropathy, neuropathy, limb loss, blindness, coronary artery disease, and death. We discussed the importance of intensive lifestyle modification including diet, exercise and weight loss as the first line treatment for diabetes. We will check labs and Joel Medina agrees to continue his diabetes medications and will follow up at the agreed upon time.  Depression Screen Joel Medina had a moderately positive depression screening. Depression is commonly associated with obesity and often results in emotional eating behaviors. We will monitor this closely and work on CBT to help improve the non-hunger eating patterns. Referral to Psychology may be required if no improvement is seen as he continues in our clinic.  Obesity Joel Medina is currently  in the action stage of change and his goal is to continue with weight loss efforts. I recommend Timotheus begin the structured treatment plan as follows:  He has agreed to follow the Category 3 plan Tsugio has been instructed to eventually work up to a goal of 150 minutes of combined cardio and strengthening exercise per week for weight loss and overall health benefits. We discussed the following Behavioral Modification Strategies today: no skipping meals, increasing lean protein intake, decreasing simple carbohydrates  and decrease eating out  Giomar has agreed to join our obesity program and follow up with our clinic in 2 weeks. He was informed of the importance of frequent follow up visits to maximize his success with intensive lifestyle modifications for his multiple health conditions. He was informed we would discuss his lab results at his next visit unless there is a critical issue that needs to be addressed sooner. Crewe agreed to keep his next visit at the agreed upon time to discuss these results.  I, Doreene Nest, am acting as transcriptionist for Dennard Nip, MD  I have reviewed the above documentation for accuracy and completeness, and I agree with the above. -Dennard Nip, MD  OBESITY BEHAVIORAL INTERVENTION VISIT  Today's visit was # 1 out of 15.  Starting weight: 327 lbs Starting date: 12/27/16 Today's weight : 327 lbs Today's date: 12/27/2016 Total lbs lost to date: 0 (Patients must lose 7 lbs in the first 6 months to continue with counseling)   ASK: We discussed the diagnosis of obesity with Joel Medina today and Koda agreed to give [place] permission to discuss obesity behavioral modification therapy today.  ASSESS: Malik has the diagnosis of obesity and his BMI today is 89 Shaurya is in the action stage of change   ADVISE: Danilo was educated on the multiple health risks of obesity as well as the benefit of  weight loss to improve his health. He was advised of the  need for long term treatment and the importance of lifestyle modifications.  AGREE: Multiple dietary modification options and treatment options were discussed and  [place] agreed to follow the Category 3 plan We discussed the following Behavioral Modification Strategies today: no skipping meals, increasing lean protein intake, decreasing simple carbohydrates  and decrease eating out

## 2016-12-28 LAB — CBC WITH DIFFERENTIAL/PLATELET
BASOS: 0 %
Basophils Absolute: 0 10*3/uL (ref 0.0–0.2)
EOS (ABSOLUTE): 0.1 10*3/uL (ref 0.0–0.4)
EOS: 1 %
HEMATOCRIT: 44.4 % (ref 37.5–51.0)
Hemoglobin: 15.6 g/dL (ref 13.0–17.7)
Immature Grans (Abs): 0.1 10*3/uL (ref 0.0–0.1)
Immature Granulocytes: 1 %
Lymphocytes Absolute: 2.3 10*3/uL (ref 0.7–3.1)
Lymphs: 34 %
MCH: 32.8 pg (ref 26.6–33.0)
MCHC: 35.1 g/dL (ref 31.5–35.7)
MCV: 94 fL (ref 79–97)
MONOS ABS: 0.4 10*3/uL (ref 0.1–0.9)
Monocytes: 6 %
NEUTROS ABS: 4 10*3/uL (ref 1.4–7.0)
Neutrophils: 58 %
PLATELETS: 212 10*3/uL (ref 150–379)
RBC: 4.75 x10E6/uL (ref 4.14–5.80)
RDW: 14.8 % (ref 12.3–15.4)
WBC: 6.9 10*3/uL (ref 3.4–10.8)

## 2016-12-28 LAB — COMPREHENSIVE METABOLIC PANEL
A/G RATIO: 1.7 (ref 1.2–2.2)
ALBUMIN: 4.3 g/dL (ref 3.5–5.5)
ALT: 20 IU/L (ref 0–44)
AST: 19 IU/L (ref 0–40)
Alkaline Phosphatase: 66 IU/L (ref 39–117)
BUN/Creatinine Ratio: 11 (ref 9–20)
BUN: 11 mg/dL (ref 6–24)
Bilirubin Total: 0.9 mg/dL (ref 0.0–1.2)
CALCIUM: 9.3 mg/dL (ref 8.7–10.2)
CO2: 21 mmol/L (ref 20–29)
Chloride: 99 mmol/L (ref 96–106)
Creatinine, Ser: 0.96 mg/dL (ref 0.76–1.27)
GFR, EST AFRICAN AMERICAN: 102 mL/min/{1.73_m2} (ref >59)
GFR, EST NON AFRICAN AMERICAN: 89 mL/min/{1.73_m2} (ref >59)
GLOBULIN, TOTAL: 2.6 g/dL (ref 1.5–4.5)
Glucose: 82 mg/dL (ref 65–99)
POTASSIUM: 4.2 mmol/L (ref 3.5–5.2)
SODIUM: 136 mmol/L (ref 134–144)
TOTAL PROTEIN: 6.9 g/dL (ref 6.0–8.5)

## 2016-12-28 LAB — TSH: TSH: 3.18 u[IU]/mL (ref 0.450–4.500)

## 2016-12-28 LAB — HEMOGLOBIN A1C
Est. average glucose Bld gHb Est-mCnc: 126 mg/dL
Hgb A1c MFr Bld: 6 % — ABNORMAL HIGH (ref 4.8–5.6)

## 2016-12-28 LAB — VITAMIN B12: VITAMIN B 12: 1211 pg/mL (ref 232–1245)

## 2016-12-28 LAB — LIPID PANEL WITH LDL/HDL RATIO
CHOLESTEROL TOTAL: 144 mg/dL (ref 100–199)
HDL: 30 mg/dL — AB (ref >39)
LDL CALC: 79 mg/dL (ref 0–99)
LDl/HDL Ratio: 2.6 ratio (ref 0.0–3.6)
Triglycerides: 174 mg/dL — ABNORMAL HIGH (ref 0–149)
VLDL CHOLESTEROL CAL: 35 mg/dL (ref 5–40)

## 2016-12-28 LAB — T3: T3 TOTAL: 100 ng/dL (ref 71–180)

## 2016-12-28 LAB — INSULIN, RANDOM: INSULIN: 53 u[IU]/mL — ABNORMAL HIGH (ref 2.6–24.9)

## 2016-12-28 LAB — FOLATE: Folate: 9.2 ng/mL (ref >3.0)

## 2016-12-28 LAB — VITAMIN D 25 HYDROXY (VIT D DEFICIENCY, FRACTURES): Vit D, 25-Hydroxy: 28.1 ng/mL — ABNORMAL LOW (ref 30.0–100.0)

## 2016-12-28 LAB — T4, FREE: FREE T4: 1.06 ng/dL (ref 0.82–1.77)

## 2016-12-31 ENCOUNTER — Telehealth (INDEPENDENT_AMBULATORY_CARE_PROVIDER_SITE_OTHER): Payer: Self-pay | Admitting: Family Medicine

## 2016-12-31 NOTE — Telephone Encounter (Signed)
I will obtain an authorization and fax over the notes.    Thank You

## 2016-12-31 NOTE — Telephone Encounter (Signed)
medina with dr Einar Gip office states a authorization is needed and requesting office notes , please fax to [phone] phone # is [phone]

## 2017-01-14 ENCOUNTER — Ambulatory Visit (INDEPENDENT_AMBULATORY_CARE_PROVIDER_SITE_OTHER): Payer: 59 | Admitting: Family Medicine

## 2017-01-14 VITALS — BP 99/66 | HR 76 | Temp 97.7°F | Ht 75.0 in | Wt 310.0 lb

## 2017-01-14 DIAGNOSIS — E559 Vitamin D deficiency, unspecified: Secondary | ICD-10-CM

## 2017-01-14 DIAGNOSIS — IMO0001 Reserved for inherently not codable concepts without codable children: Secondary | ICD-10-CM

## 2017-01-14 DIAGNOSIS — Z6838 Body mass index (BMI) 38.0-38.9, adult: Secondary | ICD-10-CM | POA: Diagnosis not present

## 2017-01-14 DIAGNOSIS — E669 Obesity, unspecified: Secondary | ICD-10-CM

## 2017-01-14 DIAGNOSIS — E119 Type 2 diabetes mellitus without complications: Secondary | ICD-10-CM | POA: Diagnosis not present

## 2017-01-14 MED ORDER — INSULIN PEN NEEDLE 32G X 4 MM MISC: 1.0000 | Freq: Every morning | 0 refills | Status: DC

## 2017-01-14 MED ORDER — VITAMIN D (ERGOCALCIFEROL) 1.25 MG (50000 UNIT) PO CAPS: 50000.0000 [IU] | ORAL_CAPSULE | ORAL | 0 refills | Status: DC

## 2017-01-14 MED ORDER — LIRAGLUTIDE 18 MG/3ML ~~LOC~~ SOPN: 0.6000 mg | PEN_INJECTOR | Freq: Every day | SUBCUTANEOUS | 0 refills | Status: DC

## 2017-01-14 NOTE — Progress Notes (Signed)
Office: [phone]  /  Fax: [phone]   HPI:   Chief Complaint: OBESITY Delta is here to discuss his progress with his obesity treatment plan. He is on the  follow the Category 3 plan and is following his eating plan approximately 90 % of the time. He states he is exercising 0 minutes 0 times per week. Aaran tried to follow the plan closely but had significant hunger and cravings. He found the weekend was harder than weekdays, mostly due to social eating opportunities. His weight is (!) 310 lb (140.6 kg) today and has had a weight loss of 17 pounds over a period of 2 to 3 weeks since his last visit. He has lost 17 lbs since starting treatment with [place].  Vitamin D deficiency Byrl has a diagnosis of vitamin D deficiency. He is not currently taking vit D and is not at goal. He admits fatigue and denies nausea, vomiting or muscle weakness.  Diabetes II Daaron has a diagnosis of diabetes type II. Diesel states he is not checking BGs at home and admits he felt hypoglycemic at times. Last A1c was at 6.0 [place] notes significant polyphagia even after finishing a larger meal. He has been working on intensive lifestyle modifications including diet, exercise, and weight loss to help control his blood glucose levels.   ALLERGIES: Allergies  Allergen Reactions  . Amlodipine     Edema with 10 mg/d    MEDICATIONS: Current Outpatient Prescriptions on File Prior to Visit  Medication Sig Dispense Refill  . Capsicum, Cayenne, (CAYENNE FRUIT) 455 MG CAPS Take by mouth.    . Cholecalciferol (D3 ADULT PO) Take 25 mcg by mouth.    . Cyanocobalamin (B-12) 2500 MCG TABS Take by mouth.    . fenoprofen (NALFON) 600 MG TABS tablet Take 600 mg by mouth.    . metFORMIN (GLUCOPHAGE) 1000 MG tablet TAKE 1 TABLET (1,000 MG TOTAL) BY MOUTH 2 (TWO) TIMES DAILY WITH A MEAL. 180 tablet 3  . telmisartan (MICARDIS) 80 MG tablet Take 1 tablet (80 mg total) by mouth daily. 90 tablet 3  .  triamterene-hydrochlorothiazide (MAXZIDE-25) 37.5-25 MG tablet Take 1 tablet by mouth daily. 90 tablet 3  . Turmeric 500 MG TABS Take by mouth.     No current facility-administered medications on file prior to visit.     PAST MEDICAL HISTORY: Past Medical History:  Diagnosis Date  . Arthritis    OA BOTH HIPS - PAIN IN LEFT HIP WORSE  . Back pain   . Diabetes mellitus without complication (HCC)    oral meds  . History of pyelonephritis   . HTN (hypertension)   . Joint pain   . Obesity   . Prediabetes   . Swelling    feet and legs    PAST SURGICAL HISTORY: Past Surgical History:  Procedure Laterality Date  . [place]  . RIGHT ULNAR NERVE SURGERY TO REMOVE SCAR TISSUE  ? 2006  . TOTAL HIP ARTHROPLASTY Left 02/04/2013   Procedure: LEFT TOTAL HIP ARTHROPLASTY;  Surgeon: [name], MD;  Location: [facility];  Service: Orthopedics;  Laterality: Left;  . TOTAL HIP ARTHROPLASTY Right 11/24/2014   Procedure: RIGHT TOTAL HIP ARTHROPLASTY ANTERIOR APPROACH;  Surgeon: Gaynelle Arabian, MD;  Location: [facility];  Service: Orthopedics;  Laterality: Right;  . WISDOM TEETH EXTRACTIONS  ? 95  . WISDOM TOOTH EXTRACTION      SOCIAL HISTORY: Social History  Substance Use Topics  . Smoking status: Never  Smoker  . Smokeless tobacco: Never Used  . Alcohol use 7.2 oz/week    12 Cans of beer per week    FAMILY HISTORY: Family History  Problem Relation Age of Onset  . Hypertension Father   . Obesity Father   . Hypertension Mother   . Cancer Mother   . Depression Mother   . Obesity Mother   . Hypertension Other   . Colon cancer Neg Hx   . Colon polyps Neg Hx   . Esophageal cancer Neg Hx   . Rectal cancer Neg Hx   . Stomach cancer Neg Hx     ROS: Review of Systems  Constitutional: Positive for malaise/fatigue and weight loss.  Gastrointestinal: Negative for nausea and vomiting.  Musculoskeletal:       Negative muscle weakness  Endo/Heme/Allergies:       Polyphagia      PHYSICAL EXAM: Blood pressure 99/66, pulse 76, temperature 97.7 F (36.5 C), temperature source Oral, height 6\' 3"  (1.905 m), weight (!) 310 lb (140.6 kg), SpO2 97 %. Body mass index is 38.75 kg/m. Physical Exam  Constitutional: He is oriented to person, place, and time. He appears well-developed and well-nourished.  Cardiovascular: Normal rate.   Pulmonary/Chest: Effort normal.  Musculoskeletal: Normal range of motion.  Neurological: He is oriented to person, place, and time.  Skin: Skin is warm and dry.  Psychiatric: He has a normal mood and affect. His behavior is normal.  Vitals reviewed.   RECENT LABS AND TESTS: BMET    Component Value Date/Time   NA 136 12/27/2016 1125   K 4.2 12/27/2016 1125   CL 99 12/27/2016 1125   CO2 21 12/27/2016 1125   GLUCOSE 82 12/27/2016 1125   GLUCOSE 197 (H) 10/01/2016 1027   BUN 11 12/27/2016 1125   CREATININE 0.96 12/27/2016 1125   CALCIUM 9.3 12/27/2016 1125   GFRNONAA 89 12/27/2016 1125   GFRAA 102 12/27/2016 1125   Lab Results  Component Value Date   HGBA1C 6.0 (H) 12/27/2016   HGBA1C 7.4 (H) 10/01/2016   HGBA1C 7.7 (H) 06/29/2016   HGBA1C 6.7 (H) 03/23/2016   HGBA1C 6.8 (H) 09/28/2015   Lab Results  Component Value Date   INSULIN 53.0 (H) 12/27/2016   CBC    Component Value Date/Time   WBC 6.9 12/27/2016 1125   WBC 6.8 10/01/2016 1027   RBC 4.75 12/27/2016 1125   RBC 4.86 10/01/2016 1027   HGB 15.6 12/27/2016 1125   HCT 44.4 12/27/2016 1125   PLT 212 12/27/2016 1125   MCV 94 12/27/2016 1125   MCH 32.8 12/27/2016 1125   MCH 31.7 11/25/2014 0435   MCHC 35.1 12/27/2016 1125   MCHC 34.9 10/01/2016 1027   RDW 14.8 12/27/2016 1125   LYMPHSABS 2.3 12/27/2016 1125   MONOABS 0.4 10/01/2016 1027   EOSABS 0.1 12/27/2016 1125   BASOSABS 0.0 12/27/2016 1125   Iron/TIBC/Ferritin/ %Sat No results found for: IRON, TIBC, FERRITIN, IRONPCTSAT Lipid Panel     Component Value Date/Time   CHOL 144 12/27/2016 1125   TRIG  174 (H) 12/27/2016 1125   HDL 30 (L) 12/27/2016 1125   CHOLHDL 4 10/01/2016 1027   VLDL 33.8 10/01/2016 1027   LDLCALC 79 12/27/2016 1125   LDLDIRECT 93.3 04/02/2014 1632   Hepatic Function Panel     Component Value Date/Time   PROT 6.9 12/27/2016 1125   ALBUMIN 4.3 12/27/2016 1125   AST 19 12/27/2016 1125   ALT 20 12/27/2016 1125   ALKPHOS  66 12/27/2016 1125   BILITOT 0.9 12/27/2016 1125   BILIDIR 0.2 10/01/2016 1027      Component Value Date/Time   TSH 3.180 12/27/2016 1125   TSH 2.60 10/01/2016 1027   TSH 3.26 06/29/2016 1205    ASSESSMENT AND PLAN: Type 2 diabetes mellitus without complication, without long-term current use of insulin (HCC) - Plan: liraglutide 18 MG/3ML SOPN, Insulin Pen Needle 32G X 4 MM MISC  Vitamin D deficiency - Plan: Vitamin D, Ergocalciferol, (DRISDOL) 50000 units CAPS capsule  Class 2 obesity with serious comorbidity and body mass index (BMI) of 38.0 to 38.9 in adult, unspecified obesity type  PLAN:  Vitamin D Deficiency Anakin was informed that low vitamin D levels contributes to fatigue and are associated with obesity, breast, and colon cancer. He agrees to start to take prescription Vit D @50 ,000 IU every week #4 with no refills and will follow up for routine testing of vitamin D, at least 2-3 times per year. He was informed of the risk of over-replacement of vitamin D and agrees to not increase his dose unless he discusses this with [place] first. Doren Custard agrees to follow up with our clinic in 2 weeks.  Diabetes II Landen has been given extensive diabetes education by myself today including ideal fasting and post-prandial blood glucose readings, individual ideal Hgb A1c goals  and hypoglycemia prevention. We discussed the importance of good blood sugar control to decrease the likelihood of diabetic complications such as nephropathy, neuropathy, limb loss, blindness, coronary artery disease, and death. We discussed the importance of intensive lifestyle  modification including diet, exercise and weight loss as the first line treatment for diabetes. Christien agrees to continue metformin and discontinue prandin. Doren Custard agrees to start victoza 0.6 mg every morning #1 pen and nano needles with no refills and will follow up at the agreed upon time.  Obesity Aldean is currently in the action stage of change. As such, his goal is to continue with weight loss efforts He has agreed to follow the Category 3 plan +200 calories Mattheu has been instructed to work up to a goal of 150 minutes of combined cardio and strengthening exercise per week for weight loss and overall health benefits. We discussed the following Behavioral Modification Strategies today: celebration eating strategies, increasing lean protein intake and decreasing simple carbohydrates   Consuelo has agreed to follow up with our clinic in 2 weeks. He was informed of the importance of frequent follow up visits to maximize his success with intensive lifestyle modifications for his multiple health conditions.  I, Doreene Nest, am acting as transcriptionist for Dennard Nip, MD  I have reviewed the above documentation for accuracy and completeness, and I agree with the above. -Dennard Nip, MD   OBESITY BEHAVIORAL INTERVENTION VISIT  Today's visit was # 2 out of 49.  Starting weight: 327 lbs Starting date: 12/27/16 Today's weight : 310 lbs Today's date: 01/14/2017 Total lbs lost to date: 17 (Patients must lose 7 lbs in the first 6 months to continue with counseling)   ASK: We discussed the diagnosis of obesity with [name] today and Kendra agreed to give [place] permission to discuss obesity behavioral modification therapy today.  ASSESS: Avelardo has the diagnosis of obesity and his BMI today is 68.8 Byran is in the action stage of change   ADVISE: [place] was educated on the multiple health risks of obesity as well as the benefit of weight loss to improve his health. He was  advised of the  need for long term treatment and the importance of lifestyle modifications.  AGREE: Multiple dietary modification options and treatment options were discussed and  Burke agreed to follow the Category 3 plan +200 calories We discussed the following Behavioral Modification Strategies today: celebration eating strategies, increasing lean protein intake and decreasing simple carbohydrates

## 2017-01-24 ENCOUNTER — Encounter: Payer: Self-pay | Admitting: Internal Medicine

## 2017-01-24 ENCOUNTER — Ambulatory Visit (INDEPENDENT_AMBULATORY_CARE_PROVIDER_SITE_OTHER): Payer: 59 | Admitting: Internal Medicine

## 2017-01-24 DIAGNOSIS — M545 Low back pain, unspecified: Secondary | ICD-10-CM

## 2017-01-24 DIAGNOSIS — E669 Obesity, unspecified: Secondary | ICD-10-CM

## 2017-01-24 DIAGNOSIS — G4730 Sleep apnea, unspecified: Secondary | ICD-10-CM | POA: Diagnosis not present

## 2017-01-24 DIAGNOSIS — Z6838 Body mass index (BMI) 38.0-38.9, adult: Secondary | ICD-10-CM | POA: Diagnosis not present

## 2017-01-24 DIAGNOSIS — R011 Cardiac murmur, unspecified: Secondary | ICD-10-CM | POA: Diagnosis not present

## 2017-01-24 DIAGNOSIS — R739 Hyperglycemia, unspecified: Secondary | ICD-10-CM

## 2017-01-24 DIAGNOSIS — M79605 Pain in left leg: Secondary | ICD-10-CM

## 2017-01-24 DIAGNOSIS — Z8679 Personal history of other diseases of the circulatory system: Secondary | ICD-10-CM | POA: Diagnosis not present

## 2017-01-24 DIAGNOSIS — E1169 Type 2 diabetes mellitus with other specified complication: Secondary | ICD-10-CM

## 2017-01-24 NOTE — Assessment & Plan Note (Signed)
Lost 17 lbs w/Dr Leafy Ro

## 2017-01-24 NOTE — Progress Notes (Signed)
Subjective:  Patient ID: Joel Medina, male    DOB: Jan 12, 1961  Age: 56 y.o. MRN: [id number]  CC: No chief complaint on file.   HPI [name] presents for HTN, OA, LBP f/u Lost 17 lbs w/Dr Leafy Ro   Outpatient Medications Prior to Visit  Medication Sig Dispense Refill  . Capsicum, Cayenne, (CAYENNE FRUIT) 455 MG CAPS Take by mouth.    . Cholecalciferol (D3 ADULT PO) Take 25 mcg by mouth.    . Cyanocobalamin (B-12) 2500 MCG TABS Take by mouth.    . fenoprofen (NALFON) 600 MG TABS tablet Take 600 mg by mouth.    . Insulin Pen Needle 32G X 4 MM MISC Inject 1 Package into the skin every morning. 100 each 0  . liraglutide 18 MG/3ML SOPN Inject 0.1 mLs (0.6 mg total) into the skin daily. 1 pen 0  . metFORMIN (GLUCOPHAGE) 1000 MG tablet TAKE 1 TABLET (1,000 MG TOTAL) BY MOUTH 2 (TWO) TIMES DAILY WITH A MEAL. 180 tablet 3  . telmisartan (MICARDIS) 80 MG tablet Take 1 tablet (80 mg total) by mouth daily. 90 tablet 3  . triamterene-hydrochlorothiazide (MAXZIDE-25) 37.5-25 MG tablet Take 1 tablet by mouth daily. 90 tablet 3  . Turmeric 500 MG TABS Take by mouth.    . Vitamin D, Ergocalciferol, (DRISDOL) 50000 units CAPS capsule Take 1 capsule (50,000 Units total) by mouth every 7 (seven) days. 4 capsule 0   No facility-administered medications prior to visit.     ROS Review of Systems  Constitutional: Negative for appetite change, fatigue and unexpected weight change.  HENT: Negative for congestion, nosebleeds, sneezing, sore throat and trouble swallowing.   Eyes: Negative for itching and visual disturbance.  Respiratory: Negative for cough.   Cardiovascular: Negative for chest pain, palpitations and leg swelling.  Gastrointestinal: Negative for abdominal distention, blood in stool, diarrhea and nausea.  Genitourinary: Negative for frequency and hematuria.  Musculoskeletal: Positive for arthralgias. Negative for back pain, gait problem, joint swelling and neck pain.  Skin:  Negative for rash.  Neurological: Negative for dizziness, tremors, speech difficulty and weakness.  Psychiatric/Behavioral: Negative for agitation, dysphoric mood, sleep disturbance and suicidal ideas. The patient is not nervous/anxious.     Objective:  BP 104/70 (BP Location: Left Arm, Patient Position: Sitting, Cuff Size: Large)   Pulse 73   Temp 98.2 F (36.8 C) (Oral)   Ht 6\' 3"  (1.905 m)   Wt (!) 310 lb (140.6 kg)   SpO2 98%   BMI 38.75 kg/m   BP Readings from Last 3 Encounters:  01/24/17 104/70  01/14/17 99/66  12/27/16 128/80    Wt Readings from Last 3 Encounters:  01/24/17 (!) 310 lb (140.6 kg)  01/14/17 (!) 310 lb (140.6 kg)  12/27/16 (!) 327 lb (148.3 kg)    Physical Exam  Constitutional: He is oriented to person, place, and time. He appears well-developed. No distress.  NAD  HENT:  Mouth/Throat: Oropharynx is clear and moist.  Eyes: Pupils are equal, round, and reactive to light. Conjunctivae are normal.  Neck: Normal range of motion. No JVD present. No thyromegaly present.  Cardiovascular: Normal rate, regular rhythm, normal heart sounds and intact distal pulses.  Exam reveals no gallop and no friction rub.   No murmur heard. Pulmonary/Chest: Effort normal and breath sounds normal. No respiratory distress. He has no wheezes. He has no rales. He exhibits no tenderness.  Abdominal: Soft. Bowel sounds are normal. He exhibits no distension and no mass. There is  no tenderness. There is no rebound and no guarding.  Musculoskeletal: Normal range of motion. He exhibits no edema or tenderness.  Lymphadenopathy:    He has no cervical adenopathy.  Neurological: He is alert and oriented to person, place, and time. He has normal reflexes. No cranial nerve deficit. He exhibits normal muscle tone. He displays a negative Romberg sign. Coordination and gait normal.  Skin: Skin is warm and dry. No rash noted.  Psychiatric: He has a normal mood and affect. His behavior is normal.  Judgment and thought content normal.    Lab Results  Component Value Date   WBC 6.9 12/27/2016   HGB 15.6 12/27/2016   HCT 44.4 12/27/2016   PLT 212 12/27/2016   GLUCOSE 82 12/27/2016   CHOL 144 12/27/2016   TRIG 174 (H) 12/27/2016   HDL 30 (L) 12/27/2016   LDLDIRECT 93.3 04/02/2014   LDLCALC 79 12/27/2016   ALT 20 12/27/2016   AST 19 12/27/2016   NA 136 12/27/2016   K 4.2 12/27/2016   CL 99 12/27/2016   CREATININE 0.96 12/27/2016   BUN 11 12/27/2016   CO2 21 12/27/2016   TSH 3.180 12/27/2016   PSA 2.20 10/01/2016   INR 1.08 11/19/2014   HGBA1C 6.0 (H) 12/27/2016   MICROALBUR 1.0 10/01/2016    Mr Brain W DZ Contrast  Result Date: 07/16/2016 CLINICAL DATA:  56 y/o M; mild headache and slurring of speech since fall 04/2016 and injury to right-sided head. EXAM: MRI HEAD WITHOUT AND WITH CONTRAST TECHNIQUE: Multiplanar, multiecho pulse sequences of the brain and surrounding structures were obtained without and with intravenous contrast. CONTRAST:  36mL MULTIHANCE GADOBENATE DIMEGLUMINE 529 MG/ML IV SOLN COMPARISON:  None. FINDINGS: Brain: No acute infarction, hemorrhage, hydrocephalus, extra-axial collection or mass lesion. Incidental prominent perivascular spaces in the left lateral frontal and right parietal white matter. No abnormal susceptibility hypointensity to suggest hemosiderin deposition. Single punctate focus of T2 FLAIR hyperintensity within the right frontal subcortical white matter is of unlikely clinical significance. No abnormal enhancement. Vascular: Normal flow voids. Skull and upper cervical spine: Normal marrow signal. Sinuses/Orbits: Trace right mastoid effusion. Mucosal thickening within the sphenoid sinus, maxillary sinuses, and anterior ethmoid air cells. Orbits are unremarkable. Other: None. IMPRESSION: No evidence for cortical contusion or diffuse axonal injury. Unremarkable MRI of the head with and without contrast for age. Electronically Signed   By: Kristine Garbe M.D.   On: 07/16/2016 15:52    Assessment & Plan:   There are no diagnoses linked to this encounter. I am having Mr. Woodbury maintain his telmisartan, metFORMIN, triamterene-hydrochlorothiazide, B-12, Cholecalciferol (D3 ADULT PO), Cayenne Fruit, fenoprofen, Turmeric, liraglutide, Insulin Pen Needle, and Vitamin D (Ergocalciferol).  No orders of the defined types were placed in this encounter.    Follow-up: No Follow-up on file.  Walker Kehr, MD

## 2017-01-24 NOTE — Assessment & Plan Note (Signed)
Ibuprofen as needed.

## 2017-01-24 NOTE — Assessment & Plan Note (Signed)
Labs

## 2017-01-28 ENCOUNTER — Ambulatory Visit (INDEPENDENT_AMBULATORY_CARE_PROVIDER_SITE_OTHER): Payer: 59 | Admitting: Family Medicine

## 2017-01-28 ENCOUNTER — Encounter (INDEPENDENT_AMBULATORY_CARE_PROVIDER_SITE_OTHER): Payer: Self-pay

## 2017-01-28 VITALS — BP 100/68 | HR 81 | Temp 98.0°F | Ht 75.0 in | Wt 303.0 lb

## 2017-01-28 DIAGNOSIS — E119 Type 2 diabetes mellitus without complications: Secondary | ICD-10-CM | POA: Diagnosis not present

## 2017-01-28 DIAGNOSIS — I10 Essential (primary) hypertension: Secondary | ICD-10-CM | POA: Diagnosis not present

## 2017-01-28 DIAGNOSIS — Z6837 Body mass index (BMI) 37.0-37.9, adult: Secondary | ICD-10-CM | POA: Diagnosis not present

## 2017-01-28 DIAGNOSIS — E669 Obesity, unspecified: Secondary | ICD-10-CM | POA: Diagnosis not present

## 2017-01-28 DIAGNOSIS — IMO0001 Reserved for inherently not codable concepts without codable children: Secondary | ICD-10-CM

## 2017-01-28 DIAGNOSIS — E860 Dehydration: Secondary | ICD-10-CM | POA: Diagnosis not present

## 2017-01-28 DIAGNOSIS — Z9189 Other specified personal risk factors, not elsewhere classified: Secondary | ICD-10-CM | POA: Diagnosis not present

## 2017-01-28 MED ORDER — ACCU-CHEK SAFE-T PRO LANCETS MISC: 0 refills | Status: DC

## 2017-01-28 MED ORDER — GLUCOSE BLOOD VI STRP: ORAL_STRIP | 0 refills | Status: DC

## 2017-01-28 NOTE — Progress Notes (Signed)
Office: [phone]  /  Fax: [phone]   HPI:   Chief Complaint: OBESITY Joel Medina is here to discuss his progress with his obesity treatment plan. He is on the Category 3 plan and is following his eating plan approximately 90 % of the time. He states he is exercising 0 minutes 0 times per week. Joel Medina continues to do well with weight loss on his category 3 plan. He has eaten out 2 times but has tried to make better choices. His weight is (!) 303 lb (137.4 kg) today and has had a weight loss of 7 pounds over a period of 2 weeks since his last visit. He has lost 24 lbs since starting treatment with [place].  Diabetes II Joel Medina has a diagnosis of diabetes type II. He started victoza and notes polyphagia has improved slightly. Tejon denies any hypoglycemic episodes. He has been working on intensive lifestyle modifications including diet, exercise, and weight loss to help control his blood glucose levels.  Hypertension Joel Medina is a 56 y.o. male with hypertension. His blood pressure decreased to 100/68 primarily with weight loss and he is starting to feel lightheaded especially when working outside. Joel Medina denies chest pain or shortness of breath on exertion. He is working weight loss to help control his blood pressure with the goal of decreasing his risk of heart attack and stroke. Philips blood pressure is not currently controlled.  Dehydration  Joel Medina notes feeling lightheaded and some muscle aches. His urine is dark and is worse when he works outdoors and when he changes position quickly.  At risk for cardiovascular disease Joel Medina is at a higher than average risk for cardiovascular disease due to obesity. He currently denies any chest pain.  ALLERGIES: Allergies  Allergen Reactions   Amlodipine     Edema with 10 mg/d    MEDICATIONS: Current Outpatient Prescriptions on File Prior to Visit  Medication Sig Dispense Refill   Capsicum, Cayenne, (CAYENNE FRUIT) 455 MG  CAPS Take by mouth.     Cholecalciferol (D3 ADULT PO) Take 25 mcg by mouth.     Cyanocobalamin (B-12) 2500 MCG TABS Take by mouth.     fenoprofen (NALFON) 600 MG TABS tablet Take 600 mg by mouth.     Insulin Pen Needle 32G X 4 MM MISC Inject 1 Package into the skin every morning. 100 each 0   liraglutide 18 MG/3ML SOPN Inject 0.1 mLs (0.6 mg total) into the skin daily. 1 pen 0   metFORMIN (GLUCOPHAGE) 1000 MG tablet TAKE 1 TABLET (1,000 MG TOTAL) BY MOUTH 2 (TWO) TIMES DAILY WITH A MEAL. 180 tablet 3   telmisartan (MICARDIS) 80 MG tablet Take 1 tablet (80 mg total) by mouth daily. 90 tablet 3   triamterene-hydrochlorothiazide (MAXZIDE-25) 37.5-25 MG tablet Take 1 tablet by mouth daily. (Patient taking differently: Take 1 tablet by mouth daily. Take 1/2 pill daily) 90 tablet 3   Turmeric 500 MG TABS Take by mouth.     Vitamin D, Ergocalciferol, (DRISDOL) 50000 units CAPS capsule Take 1 capsule (50,000 Units total) by mouth every 7 (seven) days. 4 capsule 0   No current facility-administered medications on file prior to visit.     PAST MEDICAL HISTORY: Past Medical History:  Diagnosis Date   Arthritis    OA BOTH HIPS - PAIN IN LEFT HIP WORSE   Back pain    Diabetes mellitus without complication (Joel Medina)    oral meds   History of pyelonephritis    HTN (  hypertension)    Joint pain    Obesity    Prediabetes    Swelling    feet and legs    PAST SURGICAL HISTORY: Past Surgical History:  Procedure Laterality Date   BUNINECTOMY - RIGHT  1977   RIGHT ULNAR NERVE SURGERY TO REMOVE SCAR TISSUE  ? 2006   TOTAL HIP ARTHROPLASTY Left 02/04/2013   Procedure: LEFT TOTAL HIP ARTHROPLASTY;  Surgeon: [name], MD;  Location: [facility];  Service: Orthopedics;  Laterality: Left;   TOTAL HIP ARTHROPLASTY Right 11/24/2014   Procedure: RIGHT TOTAL HIP ARTHROPLASTY ANTERIOR APPROACH;  Surgeon: Gaynelle Arabian, MD;  Location: [facility];  Service: Orthopedics;  Laterality: Right;    WISDOM TEETH EXTRACTIONS  ? 1983   WISDOM TOOTH EXTRACTION      SOCIAL HISTORY: Social History  Substance Use Topics   Smoking status: Never Smoker   Smokeless tobacco: Never Used   Alcohol use 7.2 oz/week    12 Cans of beer per week    FAMILY HISTORY: Family History  Problem Relation Age of Onset   Hypertension Father    Obesity Father    Hypertension Mother    Cancer Mother    Depression Mother    Obesity Mother    Hypertension Other    Colon cancer Neg Hx    Colon polyps Neg Hx    Esophageal cancer Neg Hx    Rectal cancer Neg Hx    Stomach cancer Neg Hx     ROS: Review of Systems  Constitutional: Positive for weight loss.  Respiratory: Negative for shortness of breath (on exertion).   Cardiovascular: Negative for chest pain.  Musculoskeletal:       Muscle aches  Neurological:       Lightheadedness  Endo/Heme/Allergies:       Polyphagia Negative hypoglycemia    PHYSICAL EXAM: Blood pressure 100/68, pulse 81, temperature 98 F (36.7 C), temperature source Oral, height 6\' 3"  (1.905 m), weight (!) 303 lb (137.4 kg), SpO2 96 %. Body mass index is 37.87 kg/m. Physical Exam  Constitutional: He is oriented to person, place, and time. He appears well-developed and well-nourished.  Cardiovascular: Normal rate.   Pulmonary/Chest: Effort normal.  Musculoskeletal: Normal range of motion.  Neurological: He is oriented to person, place, and time.  Skin: Skin is warm.  Psychiatric: He has a normal mood and affect. His behavior is normal.  Vitals reviewed.   RECENT LABS AND TESTS: BMET    Component Value Date/Time   NA 136 12/27/2016 1125   K 4.2 12/27/2016 1125   CL 99 12/27/2016 1125   CO2 21 12/27/2016 1125   GLUCOSE 82 12/27/2016 1125   GLUCOSE 197 (H) 10/01/2016 1027   BUN 11 12/27/2016 1125   CREATININE 0.96 12/27/2016 1125   CALCIUM 9.3 12/27/2016 1125   GFRNONAA 89 12/27/2016 1125   GFRAA 102 12/27/2016 1125   Lab Results    Component Value Date   HGBA1C 6.0 (H) 12/27/2016   HGBA1C 7.4 (H) 10/01/2016   HGBA1C 7.7 (H) 06/29/2016   HGBA1C 6.7 (H) 03/23/2016   HGBA1C 6.8 (H) 09/28/2015   Lab Results  Component Value Date   INSULIN 53.0 (H) 12/27/2016   CBC    Component Value Date/Time   WBC 6.9 12/27/2016 1125   WBC 6.8 10/01/2016 1027   RBC 4.75 12/27/2016 1125   RBC 4.86 10/01/2016 1027   HGB 15.6 12/27/2016 1125   HCT 44.4 12/27/2016 1125   PLT 212 12/27/2016 1125  MCV 94 12/27/2016 1125   MCH 32.8 12/27/2016 1125   MCH 31.7 11/25/2014 0435   MCHC 35.1 12/27/2016 1125   MCHC 34.9 10/01/2016 1027   RDW 14.8 12/27/2016 1125   LYMPHSABS 2.3 12/27/2016 1125   MONOABS 0.4 10/01/2016 1027   EOSABS 0.1 12/27/2016 1125   BASOSABS 0.0 12/27/2016 1125   Iron/TIBC/Ferritin/ %Sat No results found for: IRON, TIBC, FERRITIN, IRONPCTSAT Lipid Panel     Component Value Date/Time   CHOL 144 12/27/2016 1125   TRIG 174 (H) 12/27/2016 1125   HDL 30 (L) 12/27/2016 1125   CHOLHDL 4 10/01/2016 1027   VLDL 33.8 10/01/2016 1027   LDLCALC 79 12/27/2016 1125   LDLDIRECT 93.3 04/02/2014 1632   Hepatic Function Panel     Component Value Date/Time   PROT 6.9 12/27/2016 1125   ALBUMIN 4.3 12/27/2016 1125   AST 19 12/27/2016 1125   ALT 20 12/27/2016 1125   ALKPHOS 66 12/27/2016 1125   BILITOT 0.9 12/27/2016 1125   BILIDIR 0.2 10/01/2016 1027      Component Value Date/Time   TSH 3.180 12/27/2016 1125   TSH 2.60 10/01/2016 1027   TSH 3.26 06/29/2016 1205    ASSESSMENT AND PLAN: Type 2 diabetes mellitus without complication, without long-term current use of insulin (HCC) - Plan: Lancets (ACCU-CHEK SAFE-T PRO) lancets, glucose blood test strip  Essential hypertension  Dehydration  Class 2 obesity with serious comorbidity and body mass index (BMI) of 37.0 to 37.9 in adult, unspecified obesity type  PLAN:  Diabetes II Anuar has been given extensive diabetes education by myself today including  ideal fasting and post-prandial blood glucose readings, individual ideal Hgb A1c goals  and hypoglycemia prevention. We discussed the importance of good blood sugar control to decrease the likelihood of diabetic complications such as nephropathy, neuropathy, limb loss, blindness, coronary artery disease, and death. We discussed the importance of intensive lifestyle modification including diet, exercise and weight loss as the first line treatment for diabetes. Reshaun agrees to continue victoza at 0.6 mg qd, we will refill glucometer strips and lancets for 1 month and he agrees to continue metformin and will follow up at the agreed upon time.  Hypertension We discussed sodium restriction, working on healthy weight loss, and a regular exercise program as the means to achieve improved blood pressure control. Hersey agreed with this plan and agreed to follow up as directed. We will re-check blood pressure in 2 weeks and will continue to monitor his blood pressure as well as his progress with the above lifestyle modifications. He agrees to decrease Maxzide to 1/2 pill qd and he will watch for signs of hypotension as he continues his lifestyle modifications.  Dehydration  Qunicy agrees to increase H2O intake to 100+ ounces and power aid zero for electrolytes and decrease maxzide to 1/2 tablet po qd and we will re-check blood pressure in 2 weeks and he agrees to follow up at that time.  Cardiovascular risk counselling Kevan was given extended (15 minutes) coronary artery disease prevention counseling today. He is 56 y.o. male and has risk factors for heart disease including obesity. We discussed intensive lifestyle modifications today with an emphasis on specific weight loss instructions and strategies. Pt was also informed of the importance of increasing exercise and decreasing saturated fats to help prevent heart disease. Obesity Benyamin is currently in the action stage of change. As such, his goal is to  continue with weight loss efforts He has agreed to follow the Category 3  plan Lawsen has been instructed to work up to a goal of 150 minutes of combined cardio and strengthening exercise per week for weight loss and overall health benefits. We discussed the following Behavioral Modification Strategies today: meal planning & cooking strategies, increasing lean protein intake and decreasing simple carbohydrates   Wyley has agreed to follow up with our clinic in 2 to 3 weeks. He was informed of the importance of frequent follow up visits to maximize his success with intensive lifestyle modifications for his multiple health conditions.  I, Doreene Nest, am acting as transcriptionist for Dennard Nip, MD  I have reviewed the above documentation for accuracy and completeness, and I agree with the above. -Dennard Nip, MD   OBESITY BEHAVIORAL INTERVENTION VISIT  Today's visit was # 3 out of 22.  Starting weight: 327 lbs Starting date: 12/27/16 Today's weight : 303 lbs Today's date: 01/28/2017 Total lbs lost to date: 24 (Patients must lose 7 lbs in the first 6 months to continue with counseling)   ASK: We discussed the diagnosis of obesity with Joel Medina today and Devondre agreed to give [place] permission to discuss obesity behavioral modification therapy today.  ASSESS: Araceli has the diagnosis of obesity and his BMI today is 6 Laquinton is in the action stage of change   ADVISE: Zaccheaus was educated on the multiple health risks of obesity as well as the benefit of weight loss to improve his health. He was advised of the need for long term treatment and the importance of lifestyle modifications.  AGREE: Multiple dietary modification options and treatment options were discussed and  Isam agreed to follow the Category 3 plan We discussed the following Behavioral Modification Strategies today: meal planning & cooking strategies, increasing lean protein intake and decreasing simple  carbohydrates

## 2017-02-12 ENCOUNTER — Ambulatory Visit (INDEPENDENT_AMBULATORY_CARE_PROVIDER_SITE_OTHER): Payer: 59 | Admitting: Internal Medicine

## 2017-02-12 ENCOUNTER — Ambulatory Visit (INDEPENDENT_AMBULATORY_CARE_PROVIDER_SITE_OTHER): Payer: 59 | Admitting: Dietician

## 2017-02-12 ENCOUNTER — Encounter (INDEPENDENT_AMBULATORY_CARE_PROVIDER_SITE_OTHER): Payer: 59 | Admitting: Family Medicine

## 2017-02-12 ENCOUNTER — Other Ambulatory Visit (INDEPENDENT_AMBULATORY_CARE_PROVIDER_SITE_OTHER): Payer: 59

## 2017-02-12 ENCOUNTER — Encounter: Payer: Self-pay | Admitting: Internal Medicine

## 2017-02-12 DIAGNOSIS — I10 Essential (primary) hypertension: Secondary | ICD-10-CM | POA: Diagnosis not present

## 2017-02-12 DIAGNOSIS — E559 Vitamin D deficiency, unspecified: Secondary | ICD-10-CM

## 2017-02-12 DIAGNOSIS — M545 Low back pain, unspecified: Secondary | ICD-10-CM

## 2017-02-12 DIAGNOSIS — Z6837 Body mass index (BMI) 37.0-37.9, adult: Secondary | ICD-10-CM

## 2017-02-12 DIAGNOSIS — E1169 Type 2 diabetes mellitus with other specified complication: Secondary | ICD-10-CM

## 2017-02-12 DIAGNOSIS — E669 Obesity, unspecified: Secondary | ICD-10-CM | POA: Diagnosis not present

## 2017-02-12 DIAGNOSIS — Z9189 Other specified personal risk factors, not elsewhere classified: Secondary | ICD-10-CM | POA: Diagnosis not present

## 2017-02-12 DIAGNOSIS — IMO0001 Reserved for inherently not codable concepts without codable children: Secondary | ICD-10-CM

## 2017-02-12 LAB — URINALYSIS
Bilirubin Urine: NEGATIVE
Hgb urine dipstick: NEGATIVE
Ketones, ur: NEGATIVE
Leukocytes, UA: NEGATIVE
NITRITE: NEGATIVE
PH: 6 (ref 5.0–8.0)
Total Protein, Urine: NEGATIVE
UROBILINOGEN UA: 0.2 (ref 0.0–1.0)
Urine Glucose: NEGATIVE

## 2017-02-12 MED ORDER — HYDROCODONE-ACETAMINOPHEN 5-325 MG PO TABS: 1.0000 | ORAL_TABLET | Freq: Four times a day (QID) | ORAL | 0 refills | Status: DC | PRN

## 2017-02-12 MED ORDER — IBUPROFEN 800 MG PO TABS: 800.0000 mg | ORAL_TABLET | Freq: Three times a day (TID) | ORAL | 1 refills | Status: DC | PRN

## 2017-02-12 MED ORDER — KETOROLAC TROMETHAMINE 60 MG/2ML IM SOLN
60.0000 mg | Freq: Once | INTRAMUSCULAR | Status: AC
  Administered 2017-02-12: 60 mg via INTRAMUSCULAR

## 2017-02-12 NOTE — Progress Notes (Signed)
Subjective:  Patient ID: Joel Medina, male    DOB: 1960-09-08  Age: 56 y.o. MRN: [id number]  CC: No chief complaint on file.   HPI Joel Medina presents for severe LBP today x1 day. No injury. No ieead to legs. No dysuria.  Outpatient Medications Prior to Visit  Medication Sig Dispense Refill  . Capsicum, Cayenne, (CAYENNE FRUIT) 455 MG CAPS Take by mouth.    . Cholecalciferol (D3 ADULT PO) Take 25 mcg by mouth.    . Cyanocobalamin (B-12) 2500 MCG TABS Take by mouth.    . fenoprofen (NALFON) 600 MG TABS tablet Take 600 mg by mouth.    Marland Kitchen glucose blood test strip Use as instructed 100 each 0  . HYDROcodone-acetaminophen (NORCO/VICODIN) 5-325 MG tablet Take 1 tablet by mouth every 6 (six) hours as needed for moderate pain.    . Insulin Pen Needle 32G X 4 MM MISC Inject 1 Package into the skin every morning. 100 each 0  . Lancets (ACCU-CHEK SAFE-T PRO) lancets Use as instructed 100 each 0  . liraglutide 18 MG/3ML SOPN Inject 0.1 mLs (0.6 mg total) into the skin daily. 1 pen 0  . metFORMIN (GLUCOPHAGE) 1000 MG tablet TAKE 1 TABLET (1,000 MG TOTAL) BY MOUTH 2 (TWO) TIMES DAILY WITH A MEAL. 180 tablet 3  . telmisartan (MICARDIS) 80 MG tablet Take 1 tablet (80 mg total) by mouth daily. 90 tablet 3  . triamterene-hydrochlorothiazide (MAXZIDE-25) 37.5-25 MG tablet Take 1 tablet by mouth daily. (Patient taking differently: Take 1 tablet by mouth daily. Take 1/2 pill daily) 90 tablet 3  . Turmeric 500 MG TABS Take by mouth.    . Vitamin D, Ergocalciferol, (DRISDOL) 50000 units CAPS capsule Take 1 capsule (50,000 Units total) by mouth every 7 (seven) days. 4 capsule 0   No facility-administered medications prior to visit.     ROS Review of Systems  Constitutional: Negative for appetite change, fatigue and unexpected weight change.  HENT: Negative for congestion, nosebleeds, sneezing, sore throat and trouble swallowing.   Eyes: Negative for itching and visual disturbance.  Respiratory:  Negative for cough.   Cardiovascular: Negative for chest pain, palpitations and leg swelling.  Gastrointestinal: Negative for abdominal distention, blood in stool, diarrhea and nausea.  Genitourinary: Negative for frequency and hematuria.  Musculoskeletal: Positive for back pain and gait problem. Negative for joint swelling and neck pain.  Skin: Negative for rash.  Neurological: Negative for dizziness, tremors, speech difficulty and weakness.  Psychiatric/Behavioral: Negative for agitation, dysphoric mood and sleep disturbance. The patient is not nervous/anxious.     Objective:  BP 110/78 (BP Location: Left Arm, Patient Position: Sitting, Cuff Size: Large)   Pulse 67   Temp (!) 97.5 F (36.4 C) (Oral)   Ht 6\' 3"  (1.905 m)   Wt (!) 310 lb (140.6 kg)   SpO2 99%   BMI 38.75 kg/m   BP Readings from Last 3 Encounters:  02/12/17 110/78  02/12/17 107/72  01/28/17 100/68    Wt Readings from Last 3 Encounters:  02/12/17 (!) 310 lb (140.6 kg)  02/12/17 (!) 302 lb (137 kg)  01/28/17 (!) 303 lb (137.4 kg)    Physical Exam  Constitutional: He is oriented to person, place, and time. He appears well-developed. No distress.  NAD  HENT:  Mouth/Throat: Oropharynx is clear and moist.  Eyes: Pupils are equal, round, and reactive to light. Conjunctivae are normal.  Neck: Normal range of motion. No JVD present. No thyromegaly present.  Cardiovascular:  Normal rate, regular rhythm, normal heart sounds and intact distal pulses.  Exam reveals no gallop and no friction rub.   No murmur heard. Pulmonary/Chest: Effort normal and breath sounds normal. No respiratory distress. He has no wheezes. He has no rales. He exhibits no tenderness.  Abdominal: Soft. Bowel sounds are normal. He exhibits no distension and no mass. There is no tenderness. There is no rebound and no guarding.  Musculoskeletal: Normal range of motion. He exhibits tenderness. He exhibits no edema.  Lymphadenopathy:    He has no  cervical adenopathy.  Neurological: He is alert and oriented to person, place, and time. He has normal reflexes. No cranial nerve deficit. He exhibits normal muscle tone. He displays a negative Romberg sign. Coordination and gait normal.  Skin: Skin is warm and dry. No rash noted.  Psychiatric: He has a normal mood and affect. His behavior is normal. Judgment and thought content normal.  LS - very tender Str leg elev (-) B  Lab Results  Component Value Date   WBC 6.9 12/27/2016   HGB 15.6 12/27/2016   HCT 44.4 12/27/2016   PLT 212 12/27/2016   GLUCOSE 82 12/27/2016   CHOL 144 12/27/2016   TRIG 174 (H) 12/27/2016   HDL 30 (L) 12/27/2016   LDLDIRECT 93.3 04/02/2014   LDLCALC 79 12/27/2016   ALT 20 12/27/2016   AST 19 12/27/2016   NA 136 12/27/2016   K 4.2 12/27/2016   CL 99 12/27/2016   CREATININE 0.96 12/27/2016   BUN 11 12/27/2016   CO2 21 12/27/2016   TSH 3.180 12/27/2016   PSA 2.20 10/01/2016   INR 1.08 11/19/2014   HGBA1C 6.0 (H) 12/27/2016   MICROALBUR 1.0 10/01/2016    Mr Brain W Wo Contrast  Result Date: 07/16/2016 CLINICAL DATA:  56 y/o M; mild headache and slurring of speech since fall 04/2016 and injury to right-sided head. EXAM: MRI HEAD WITHOUT AND WITH CONTRAST TECHNIQUE: Multiplanar, multiecho pulse sequences of the brain and surrounding structures were obtained without and with intravenous contrast. CONTRAST:  56mL MULTIHANCE GADOBENATE DIMEGLUMINE 529 MG/ML IV SOLN COMPARISON:  None. FINDINGS: Brain: No acute infarction, hemorrhage, hydrocephalus, extra-axial collection or mass lesion. Incidental prominent perivascular spaces in the left lateral frontal and right parietal white matter. No abnormal susceptibility hypointensity to suggest hemosiderin deposition. Single punctate focus of T2 FLAIR hyperintensity within the right frontal subcortical white matter is of unlikely clinical significance. No abnormal enhancement. Vascular: Normal flow voids. Skull and upper  cervical spine: Normal marrow signal. Sinuses/Orbits: Trace right mastoid effusion. Mucosal thickening within the sphenoid sinus, maxillary sinuses, and anterior ethmoid air cells. Orbits are unremarkable. Other: None. IMPRESSION: No evidence for cortical contusion or diffuse axonal injury. Unremarkable MRI of the head with and without contrast for age. Electronically Signed   By: Kristine Garbe M.D.   On: 07/16/2016 15:52    Assessment & Plan:   There are no diagnoses linked to this encounter. I am having Mr. Pollio maintain his telmisartan, metFORMIN, triamterene-hydrochlorothiazide, B-12, Cholecalciferol (D3 ADULT PO), Cayenne Fruit, fenoprofen, Turmeric, liraglutide, Insulin Pen Needle, Vitamin D (Ergocalciferol), accu-chek safe-t pro, glucose blood, and HYDROcodone-acetaminophen.  No orders of the defined types were placed in this encounter.    Follow-up: No Follow-up on file.  Walker Kehr, MD

## 2017-02-12 NOTE — Assessment & Plan Note (Signed)
?  facet joint pain/spasms Toradol 60 mg IM Norco prn

## 2017-02-12 NOTE — Progress Notes (Signed)
Office: [phone]  /  Fax: [phone] Today's visit was 4 out of 22   HPI:   Chief Complaint: OBESITY Joel Medina is here to discuss his progress with his obesity treatment plan. He is on the  Category 3 meal plan  and is following his eating plan approximately 80 % of the time. He states he is exercising 30-60 minutes 5 times per week, however is complaining of back pain today and has not been able to exercise for the past few days.  Joel Medina is currently struggling with some boredom with his meal plan and reports decreasing his diet soda intake somewhat, drinks 6-8 per day in lieu of water.    His weight is 302lbs   today and has lost 1 lb since his last visit. He has lost 25 lbs since starting treatment with [place].  At risk for cardiovascular disease Joel Medina is at a higher than average risk for cardiovascular disease due to obesity, hyperlipidemia and hypertension. He currently denies any chest pain.   ALLERGIES: Allergies  Allergen Reactions  . Amlodipine     Edema with 10 mg/d    MEDICATIONS: Current Outpatient Prescriptions on File Prior to Visit  Medication Sig Dispense Refill  . Capsicum, Cayenne, (CAYENNE FRUIT) 455 MG CAPS Take by mouth.    . Cholecalciferol (D3 ADULT PO) Take 25 mcg by mouth.    . Cyanocobalamin (B-12) 2500 MCG TABS Take by mouth.    Joel Kitchen glucose blood test strip Use as instructed 100 each 0  . HYDROcodone-acetaminophen (NORCO/VICODIN) 5-325 MG tablet Take 1-2 tablets by mouth every 6 (six) hours as needed for moderate pain. 60 tablet 0  . ibuprofen (ADVIL,MOTRIN) 800 MG tablet Take 1 tablet (800 mg total) by mouth every 8 (eight) hours as needed. 30 tablet 1  . Insulin Pen Needle 32G X 4 MM MISC Inject 1 Package into the skin every morning. 100 each 0  . Lancets (ACCU-CHEK SAFE-T PRO) lancets Use as instructed 100 each 0  . liraglutide 18 MG/3ML SOPN Inject 0.1 mLs (0.6 mg total) into the skin daily. 1 pen 0  . metFORMIN (GLUCOPHAGE) 1000 MG tablet TAKE 1  TABLET (1,000 MG TOTAL) BY MOUTH 2 (TWO) TIMES DAILY WITH A MEAL. 180 tablet 3  . telmisartan (MICARDIS) 80 MG tablet Take 1 tablet (80 mg total) by mouth daily. 90 tablet 3  . triamterene-hydrochlorothiazide (MAXZIDE-25) 37.5-25 MG tablet Take 1 tablet by mouth daily. (Patient taking differently: Take 1 tablet by mouth daily. Take 1/2 pill daily) 90 tablet 3  . Turmeric 500 MG TABS Take by mouth.    . Vitamin D, Ergocalciferol, (DRISDOL) 50000 units CAPS capsule Take 1 capsule (50,000 Units total) by mouth every 7 (seven) days. 4 capsule 0   No current facility-administered medications on file prior to visit.     PAST MEDICAL HISTORY: Past Medical History:  Diagnosis Date  . Arthritis    OA BOTH HIPS - PAIN IN LEFT HIP WORSE  . Back pain   . Diabetes mellitus without complication (HCC)    oral meds  . History of pyelonephritis   . HTN (hypertension)   . Joint pain   . Obesity   . Prediabetes   . Swelling    feet and legs    PAST SURGICAL HISTORY: Past Surgical History:  Procedure Laterality Date  . [place]  . RIGHT ULNAR NERVE SURGERY TO REMOVE SCAR TISSUE  ? 2006  . TOTAL HIP ARTHROPLASTY Left 02/04/2013  Procedure: LEFT TOTAL HIP ARTHROPLASTY;  Surgeon: [name], MD;  Location: [facility];  Service: Orthopedics;  Laterality: Left;  . TOTAL HIP ARTHROPLASTY Right 11/24/2014   Procedure: RIGHT TOTAL HIP ARTHROPLASTY ANTERIOR APPROACH;  Surgeon: Gaynelle Arabian, MD;  Location: [facility];  Service: Orthopedics;  Laterality: Right;  . WISDOM TEETH EXTRACTIONS  ? 44  . WISDOM TOOTH EXTRACTION      SOCIAL HISTORY: Social History  Substance Use Topics  . Smoking status: Never Smoker  . Smokeless tobacco: Never Used  . Alcohol use 7.2 oz/week    12 Cans of beer per week    FAMILY HISTORY: Family History  Problem Relation Age of Onset  . Hypertension Father   . Obesity Father   . Hypertension Mother   . Cancer Mother   . Depression Mother   . Obesity  Mother   . Hypertension Other   . Colon cancer Neg Hx   . Colon polyps Neg Hx   . Esophageal cancer Neg Hx   . Rectal cancer Neg Hx   . Stomach cancer Neg Hx     ROS: ROS  PHYSICAL EXAM: There were no vitals taken for this visit. There is no height or weight on file to calculate BMI. Physical Exam  RECENT LABS AND TESTS: BMET    Component Value Date/Time   NA 136 12/27/2016 1125   K 4.2 12/27/2016 1125   CL 99 12/27/2016 1125   CO2 21 12/27/2016 1125   GLUCOSE 82 12/27/2016 1125   GLUCOSE 197 (H) 10/01/2016 1027   BUN 11 12/27/2016 1125   CREATININE 0.96 12/27/2016 1125   CALCIUM 9.3 12/27/2016 1125   GFRNONAA 89 12/27/2016 1125   GFRAA 102 12/27/2016 1125   Lab Results  Component Value Date   HGBA1C 6.0 (H) 12/27/2016   HGBA1C 7.4 (H) 10/01/2016   HGBA1C 7.7 (H) 06/29/2016   HGBA1C 6.7 (H) 03/23/2016   HGBA1C 6.8 (H) 09/28/2015   Lab Results  Component Value Date   INSULIN 53.0 (H) 12/27/2016   CBC    Component Value Date/Time   WBC 6.9 12/27/2016 1125   WBC 6.8 10/01/2016 1027   RBC 4.75 12/27/2016 1125   RBC 4.86 10/01/2016 1027   HGB 15.6 12/27/2016 1125   HCT 44.4 12/27/2016 1125   PLT 212 12/27/2016 1125   MCV 94 12/27/2016 1125   MCH 32.8 12/27/2016 1125   MCH 31.7 11/25/2014 0435   MCHC 35.1 12/27/2016 1125   MCHC 34.9 10/01/2016 1027   RDW 14.8 12/27/2016 1125   LYMPHSABS 2.3 12/27/2016 1125   MONOABS 0.4 10/01/2016 1027   EOSABS 0.1 12/27/2016 1125   BASOSABS 0.0 12/27/2016 1125   Iron/TIBC/Ferritin/ %Sat No results found for: IRON, TIBC, FERRITIN, IRONPCTSAT Lipid Panel     Component Value Date/Time   CHOL 144 12/27/2016 1125   TRIG 174 (H) 12/27/2016 1125   HDL 30 (L) 12/27/2016 1125   CHOLHDL 4 10/01/2016 1027   VLDL 33.8 10/01/2016 1027   LDLCALC 79 12/27/2016 1125   LDLDIRECT 93.3 04/02/2014 1632   Hepatic Function Panel     Component Value Date/Time   PROT 6.9 12/27/2016 1125   ALBUMIN 4.3 12/27/2016 1125   AST 19  12/27/2016 1125   ALT 20 12/27/2016 1125   ALKPHOS 66 12/27/2016 1125   BILITOT 0.9 12/27/2016 1125   BILIDIR 0.2 10/01/2016 1027      Component Value Date/Time   TSH 3.180 12/27/2016 1125   TSH 2.60 10/01/2016 1027  TSH 3.26 06/29/2016 1205     PLAN: Joel Medina is currently in the action stage of change. As such, his goal is to continue with weight loss efforts. His BMI today is 37.75. He has agreed to follow the Category 3 plan and was given additional options for breakfast. Joel Medina has been instructed to work up to a goal of 150 minutes of combined cardio and strengthening exercise per week as tolerated for weight loss and overall health benefits. We discussed the following Behavioral Modification Stratagies today: increasing lean protein intake, decreasing high fat meats, eating out, decreasing simple sugars and continuing to monitor his blood sugars. Also patient was encouraged to increase his water intake to 100+ ounces and decrease his diet soda intake.   Cardiovascular risk counselling Joel Medina was given extended (30 minute) coronary artery disease prevention counseling today. He is 56 y.o. male and has risk factors for heart disease including obesity. We discussed intensive lifestyle modifications today with an emphasis on specific weight loss instructions, good glucose management and weight loss strategies. Pt was also informed of the importance of increasing exercise as tolerated.    I have reviewed the above documentation for accuracy and completeness, and I agree with the above. -Dennard Nip, MD

## 2017-02-12 NOTE — Assessment & Plan Note (Signed)
metformin

## 2017-02-12 NOTE — Assessment & Plan Note (Signed)
Micardis, Maxzide

## 2017-02-12 NOTE — Addendum Note (Signed)
Addended by: Karren Cobble on: 02/12/2017 12:06 PM   Modules accepted: Orders

## 2017-02-13 ENCOUNTER — Other Ambulatory Visit (INDEPENDENT_AMBULATORY_CARE_PROVIDER_SITE_OTHER): Payer: Self-pay | Admitting: Family Medicine

## 2017-02-13 DIAGNOSIS — E559 Vitamin D deficiency, unspecified: Secondary | ICD-10-CM

## 2017-02-14 ENCOUNTER — Other Ambulatory Visit (INDEPENDENT_AMBULATORY_CARE_PROVIDER_SITE_OTHER): Payer: Self-pay

## 2017-02-14 DIAGNOSIS — E559 Vitamin D deficiency, unspecified: Secondary | ICD-10-CM | POA: Insufficient documentation

## 2017-02-14 MED ORDER — VITAMIN D (ERGOCALCIFEROL) 1.25 MG (50000 UNIT) PO CAPS: 50000.0000 [IU] | ORAL_CAPSULE | ORAL | 0 refills | Status: DC

## 2017-02-27 ENCOUNTER — Ambulatory Visit (INDEPENDENT_AMBULATORY_CARE_PROVIDER_SITE_OTHER): Payer: 59 | Admitting: Family Medicine

## 2017-02-27 ENCOUNTER — Encounter (INDEPENDENT_AMBULATORY_CARE_PROVIDER_SITE_OTHER): Payer: Self-pay

## 2017-02-27 VITALS — BP 102/70 | HR 68 | Temp 97.9°F | Ht 75.0 in | Wt 293.0 lb

## 2017-02-27 DIAGNOSIS — IMO0001 Reserved for inherently not codable concepts without codable children: Secondary | ICD-10-CM

## 2017-02-27 DIAGNOSIS — E119 Type 2 diabetes mellitus without complications: Secondary | ICD-10-CM | POA: Diagnosis not present

## 2017-02-27 DIAGNOSIS — E669 Obesity, unspecified: Secondary | ICD-10-CM | POA: Diagnosis not present

## 2017-02-27 DIAGNOSIS — Z6836 Body mass index (BMI) 36.0-36.9, adult: Secondary | ICD-10-CM

## 2017-02-27 MED ORDER — LIRAGLUTIDE 18 MG/3ML ~~LOC~~ SOPN: 1.2000 mg | PEN_INJECTOR | Freq: Every morning | SUBCUTANEOUS | 0 refills | Status: DC

## 2017-02-28 NOTE — Progress Notes (Signed)
Office: [phone]  /  Fax: [phone]   HPI:   Chief Complaint: OBESITY Joel Medina is here to discuss his progress with his obesity treatment plan. He is on the Category 3 plan and is following his eating plan approximately 80 % of the time. He states he is walking for 30 to 40 minutes 4 to 5 times per week. Tequan continues to do well with weight loss on the category 3 plan. He still struggles with some emotional eating but he has good coping strategies. His weight is 293 lb (132.9 kg) today and has had a weight loss of 9 pounds over a period of 2 weeks since his last visit. He has lost 34 lbs since starting treatment with [place].  Diabetes II Joel Medina has a diagnosis of diabetes type II. Joel Medina still admits polyphagia and denies any hypoglycemic episodes. Last A1c was at 6.0 and he is currently taking Victoza and Metformin. Joel Medina is having a hard time sticking his fingers in order to check his BGs because his lancing device is not going deep enough. He has been working on intensive lifestyle modifications including diet, exercise, and weight loss to help control his blood glucose levels.   ALLERGIES: Allergies  Allergen Reactions  . Amlodipine     Edema with 10 mg/d    MEDICATIONS: Current Outpatient Prescriptions on File Prior to Visit  Medication Sig Dispense Refill  . Capsicum, Cayenne, (CAYENNE FRUIT) 455 MG CAPS Take by mouth.    . Cholecalciferol (D3 ADULT PO) Take 25 mcg by mouth.    . Cyanocobalamin (B-12) 2500 MCG TABS Take by mouth.    Marland Kitchen glucose blood test strip Use as instructed 100 each 0  . ibuprofen (ADVIL,MOTRIN) 800 MG tablet Take 1 tablet (800 mg total) by mouth every 8 (eight) hours as needed. 30 tablet 1  . Insulin Pen Needle 32G X 4 MM MISC Inject 1 Package into the skin every morning. 100 each 0  . Lancets (ACCU-CHEK SAFE-T PRO) lancets Use as instructed 100 each 0  . metFORMIN (GLUCOPHAGE) 1000 MG tablet TAKE 1 TABLET (1,000 MG TOTAL) BY MOUTH 2 (TWO) TIMES  DAILY WITH A MEAL. 180 tablet 3  . telmisartan (MICARDIS) 80 MG tablet Take 1 tablet (80 mg total) by mouth daily. 90 tablet 3  . triamterene-hydrochlorothiazide (MAXZIDE-25) 37.5-25 MG tablet Take 1 tablet by mouth daily. (Patient taking differently: Take 1 tablet by mouth daily. Take 1/2 pill daily) 90 tablet 3  . Turmeric 500 MG TABS Take by mouth.    . Vitamin D, Ergocalciferol, (DRISDOL) 50000 units CAPS capsule Take 1 capsule (50,000 Units total) by mouth every 7 (seven) days. 4 capsule 0   No current facility-administered medications on file prior to visit.     PAST MEDICAL HISTORY: Past Medical History:  Diagnosis Date  . Arthritis    OA BOTH HIPS - PAIN IN LEFT HIP WORSE  . Back pain   . Diabetes mellitus without complication (HCC)    oral meds  . History of pyelonephritis   . HTN (hypertension)   . Joint pain   . Obesity   . Prediabetes   . Swelling    feet and legs    PAST SURGICAL HISTORY: Past Surgical History:  Procedure Laterality Date  . [place]  . RIGHT ULNAR NERVE SURGERY TO REMOVE SCAR TISSUE  ? 2006  . TOTAL HIP ARTHROPLASTY Left 02/04/2013   Procedure: LEFT TOTAL HIP ARTHROPLASTY;  Surgeon: [name], MD;  Location: [facility];  Service: Orthopedics;  Laterality: Left;  . TOTAL HIP ARTHROPLASTY Right 11/24/2014   Procedure: RIGHT TOTAL HIP ARTHROPLASTY ANTERIOR APPROACH;  Surgeon: Gaynelle Arabian, MD;  Location: [facility];  Service: Orthopedics;  Laterality: Right;  . WISDOM TEETH EXTRACTIONS  ? 59  . WISDOM TOOTH EXTRACTION      SOCIAL HISTORY: Social History  Substance Use Topics  . Smoking status: Never Smoker  . Smokeless tobacco: Never Used  . Alcohol use 7.2 oz/week    12 Cans of beer per week    FAMILY HISTORY: Family History  Problem Relation Age of Onset  . Hypertension Father   . Obesity Father   . Hypertension Mother   . Cancer Mother   . Depression Mother   . Obesity Mother   . Hypertension Other   . Colon cancer  Neg Hx   . Colon polyps Neg Hx   . Esophageal cancer Neg Hx   . Rectal cancer Neg Hx   . Stomach cancer Neg Hx     ROS: Review of Systems  Constitutional: Positive for weight loss.  Endo/Heme/Allergies:       Positive polyphagia Negative hypoglycemia    PHYSICAL EXAM: Blood pressure 102/70, pulse 68, temperature 97.9 F (36.6 C), temperature source Oral, height 6\' 3"  (1.905 m), weight 293 lb (132.9 kg), SpO2 99 %. Body mass index is 36.62 kg/m. Physical Exam  Constitutional: He is oriented to person, place, and time. He appears well-developed and well-nourished.  Cardiovascular: Normal rate.   Pulmonary/Chest: Effort normal.  Musculoskeletal: Normal range of motion.  Neurological: He is oriented to person, place, and time.  Skin: Skin is warm and dry.  Psychiatric: He has a normal mood and affect. His behavior is normal.  Vitals reviewed.   RECENT LABS AND TESTS: BMET    Component Value Date/Time   NA 136 12/27/2016 1125   K 4.2 12/27/2016 1125   CL 99 12/27/2016 1125   CO2 21 12/27/2016 1125   GLUCOSE 82 12/27/2016 1125   GLUCOSE 197 (H) 10/01/2016 1027   BUN 11 12/27/2016 1125   CREATININE 0.96 12/27/2016 1125   CALCIUM 9.3 12/27/2016 1125   GFRNONAA 89 12/27/2016 1125   GFRAA 102 12/27/2016 1125   Lab Results  Component Value Date   HGBA1C 6.0 (H) 12/27/2016   HGBA1C 7.4 (H) 10/01/2016   HGBA1C 7.7 (H) 06/29/2016   HGBA1C 6.7 (H) 03/23/2016   HGBA1C 6.8 (H) 09/28/2015   Lab Results  Component Value Date   INSULIN 53.0 (H) 12/27/2016   CBC    Component Value Date/Time   WBC 6.9 12/27/2016 1125   WBC 6.8 10/01/2016 1027   RBC 4.75 12/27/2016 1125   RBC 4.86 10/01/2016 1027   HGB 15.6 12/27/2016 1125   HCT 44.4 12/27/2016 1125   PLT 212 12/27/2016 1125   MCV 94 12/27/2016 1125   MCH 32.8 12/27/2016 1125   MCH 31.7 11/25/2014 0435   MCHC 35.1 12/27/2016 1125   MCHC 34.9 10/01/2016 1027   RDW 14.8 12/27/2016 1125   LYMPHSABS 2.3 12/27/2016 1125    MONOABS 0.4 10/01/2016 1027   EOSABS 0.1 12/27/2016 1125   BASOSABS 0.0 12/27/2016 1125   Iron/TIBC/Ferritin/ %Sat No results found for: IRON, TIBC, FERRITIN, IRONPCTSAT Lipid Panel     Component Value Date/Time   CHOL 144 12/27/2016 1125   TRIG 174 (H) 12/27/2016 1125   HDL 30 (L) 12/27/2016 1125   CHOLHDL 4 10/01/2016 1027   VLDL 33.8 10/01/2016  1027   LDLCALC 79 12/27/2016 1125   LDLDIRECT 93.3 04/02/2014 1632   Hepatic Function Panel     Component Value Date/Time   PROT 6.9 12/27/2016 1125   ALBUMIN 4.3 12/27/2016 1125   AST 19 12/27/2016 1125   ALT 20 12/27/2016 1125   ALKPHOS 66 12/27/2016 1125   BILITOT 0.9 12/27/2016 1125   BILIDIR 0.2 10/01/2016 1027      Component Value Date/Time   TSH 3.180 12/27/2016 1125   TSH 2.60 10/01/2016 1027   TSH 3.26 06/29/2016 1205    ASSESSMENT AND PLAN: Type 2 diabetes mellitus without complication, without long-term current use of insulin ([place]) - Plan: liraglutide 18 MG/3ML SOPN  Class 2 obesity with serious comorbidity and body mass index (BMI) of 36.0 to 36.9 in adult, unspecified obesity type  PLAN:  Diabetes II Isley has been given extensive diabetes education by myself today including ideal fasting and post-prandial blood glucose readings, individual ideal Hgb A1c goals  and hypoglycemia prevention. We discussed the importance of good blood sugar control to decrease the likelihood of diabetic complications such as nephropathy, neuropathy, limb loss, blindness, coronary artery disease, and death. We discussed the importance of intensive lifestyle modification including diet, exercise and weight loss as the first line treatment for diabetes. Raykwon agrees to increase Victoza to 1.2 mg #2 with no refills Lebeau is to increase to 5 clicks past 0.6 to 0.9 for now) and will follow up at the agreed upon time. Ronn needs a new lancing device that goes deeper than the [employer].  Obesity Omri is currently in the action stage of  change. As such, his goal is to continue with weight loss efforts He has agreed to follow the Category 3 plan Addison has been instructed to work up to a goal of 150 minutes of combined cardio and strengthening exercise per week for weight loss and overall health benefits. We discussed the following Behavioral Modification Strategies today: increasing lean protein intake and decreasing simple carbohydrates   Drue has agreed to follow up with our clinic in 2 weeks. He was informed of the importance of frequent follow up visits to maximize his success with intensive lifestyle modifications for his multiple health conditions.  I, Doreene Nest, am acting as transcriptionist for Dennard Nip, MD  I have reviewed the above documentation for accuracy and completeness, and I agree with the above. -Dennard Nip, MD    OBESITY BEHAVIORAL INTERVENTION VISIT  Today's visit was # 5 out of 22.  Starting weight: 327 lbs Starting date: 12/27/16 Today's weight : 293 lbs Today's date: 02/27/2017 Total lbs lost to date: 34 (Patients must lose 7 lbs in the first 6 months to continue with counseling)   ASK: We discussed the diagnosis of obesity with [name] today and Joel Medina agreed to give [place] permission to discuss obesity behavioral modification therapy today.  ASSESS: Joel Medina has the diagnosis of obesity and his BMI today is 36.62 Joel Medina is in the action stage of change   ADVISE: Joel Medina was educated on the multiple health risks of obesity as well as the benefit of weight loss to improve his health. He was advised of the need for long term treatment and the importance of lifestyle modifications.  AGREE: Multiple dietary modification options and treatment options were discussed and  Joel Medina agreed to follow the Category 3 plan We discussed the following Behavioral Modification Strategies today: increasing lean protein intake and decreasing simple carbohydrates

## 2017-03-20 ENCOUNTER — Ambulatory Visit (INDEPENDENT_AMBULATORY_CARE_PROVIDER_SITE_OTHER): Payer: 59 | Admitting: Family Medicine

## 2017-03-20 VITALS — BP 99/66 | HR 69 | Temp 97.9°F | Ht 75.0 in | Wt 286.0 lb

## 2017-03-20 DIAGNOSIS — Z6835 Body mass index (BMI) 35.0-35.9, adult: Secondary | ICD-10-CM | POA: Diagnosis not present

## 2017-03-20 DIAGNOSIS — Z9189 Other specified personal risk factors, not elsewhere classified: Secondary | ICD-10-CM | POA: Diagnosis not present

## 2017-03-20 DIAGNOSIS — E119 Type 2 diabetes mellitus without complications: Secondary | ICD-10-CM

## 2017-03-20 MED ORDER — INSULIN PEN NEEDLE 32G X 4 MM MISC: 1.0000 | Freq: Every morning | 0 refills | Status: DC

## 2017-03-20 NOTE — Progress Notes (Signed)
Office: [phone]  /  Fax: [phone]   HPI:   Chief Complaint: OBESITY Joel Medina is here to discuss his progress with his obesity treatment plan. He is on the  follow the Category 3 plan and is following his eating plan approximately 80 % of the time. He states he is walking for 60 minutes 7 times per week. Joel Medina continues to do well with weight loss. He notes some increased stress eating but was able to control his portions better. Hunger is controlled and he is still working on meal planning. His weight is 286 lb (129.7 kg) today and has had a weight loss of 7 pounds over a period of 3 weeks since his last visit. He has lost 41 lbs since starting treatment with [place].  Diabetes II Joel Medina has a diagnosis of diabetes type II. Joel Medina states BGs range in the 130's and denies any hypoglycemic episodes on Victoza and Metformin. He has been working on intensive lifestyle modifications including diet, exercise, and weight loss to help control his blood glucose levels.  At risk for cardiovascular disease Joel Medina is at a higher than average risk for cardiovascular disease due to obesity and diabetes. He currently denies any chest pain.  ALLERGIES: Allergies  Allergen Reactions  . Amlodipine     Edema with 10 mg/d    MEDICATIONS: Current Outpatient Prescriptions on File Prior to Visit  Medication Sig Dispense Refill  . Capsicum, Cayenne, (CAYENNE FRUIT) 455 MG CAPS Take by mouth.    . Cholecalciferol (D3 ADULT PO) Take 25 mcg by mouth.    . Cyanocobalamin (B-12) 2500 MCG TABS Take by mouth.    Marland Kitchen glucose blood test strip Use as instructed 100 each 0  . ibuprofen (ADVIL,MOTRIN) 800 MG tablet Take 1 tablet (800 mg total) by mouth every 8 (eight) hours as needed. 30 tablet 1  . Lancets (ACCU-CHEK SAFE-T PRO) lancets Use as instructed 100 each 0  . liraglutide 18 MG/3ML SOPN Inject 0.2 mLs (1.2 mg total) into the skin every morning. 2 pen 0  . metFORMIN (GLUCOPHAGE) 1000 MG tablet TAKE 1  TABLET (1,000 MG TOTAL) BY MOUTH 2 (TWO) TIMES DAILY WITH A MEAL. 180 tablet 3  . telmisartan (MICARDIS) 80 MG tablet Take 1 tablet (80 mg total) by mouth daily. 90 tablet 3  . triamterene-hydrochlorothiazide (MAXZIDE-25) 37.5-25 MG tablet Take 1 tablet by mouth daily. (Patient taking differently: Take 1 tablet by mouth daily. Take 1/2 pill daily) 90 tablet 3  . Turmeric 500 MG TABS Take by mouth.    . Vitamin D, Ergocalciferol, (DRISDOL) 50000 units CAPS capsule Take 1 capsule (50,000 Units total) by mouth every 7 (seven) days. 4 capsule 0   No current facility-administered medications on file prior to visit.     PAST MEDICAL HISTORY: Past Medical History:  Diagnosis Date  . Arthritis    OA BOTH HIPS - PAIN IN LEFT HIP WORSE  . Back pain   . Diabetes mellitus without complication (HCC)    oral meds  . History of pyelonephritis   . HTN (hypertension)   . Joint pain   . Obesity   . Prediabetes   . Swelling    feet and legs    PAST SURGICAL HISTORY: Past Surgical History:  Procedure Laterality Date  . [place]  . RIGHT ULNAR NERVE SURGERY TO REMOVE SCAR TISSUE  ? 2006  . TOTAL HIP ARTHROPLASTY Left 02/04/2013   Procedure: LEFT TOTAL HIP ARTHROPLASTY;  Surgeon: Dione Plover  Aluisio, MD;  Location: [facility];  Service: Orthopedics;  Laterality: Left;  . TOTAL HIP ARTHROPLASTY Right 11/24/2014   Procedure: RIGHT TOTAL HIP ARTHROPLASTY ANTERIOR APPROACH;  Surgeon: Gaynelle Arabian, MD;  Location: [facility];  Service: Orthopedics;  Laterality: Right;  . WISDOM TEETH EXTRACTIONS  ? 41  . WISDOM TOOTH EXTRACTION      SOCIAL HISTORY: Social History  Substance Use Topics  . Smoking status: Never Smoker  . Smokeless tobacco: Never Used  . Alcohol use 7.2 oz/week    12 Cans of beer per week    FAMILY HISTORY: Family History  Problem Relation Age of Onset  . Hypertension Father   . Obesity Father   . Hypertension Mother   . Cancer Mother   . Depression Mother   . Obesity  Mother   . Hypertension Other   . Colon cancer Neg Hx   . Colon polyps Neg Hx   . Esophageal cancer Neg Hx   . Rectal cancer Neg Hx   . Stomach cancer Neg Hx     ROS: Review of Systems  Constitutional: Positive for weight loss.  Cardiovascular: Negative for chest pain.    PHYSICAL EXAM: Blood pressure 99/66, pulse 69, temperature 97.9 F (36.6 C), temperature source Oral, height 6\' 3"  (1.905 m), weight 286 lb (129.7 kg), SpO2 97 %. Body mass index is 35.75 kg/m. Physical Exam  Constitutional: He is oriented to person, place, and time. He appears well-developed and well-nourished.  Cardiovascular: Normal rate.   Pulmonary/Chest: Effort normal.  Musculoskeletal: Normal range of motion.  Neurological: He is oriented to person, place, and time.  Skin: Skin is warm and dry.  Psychiatric: He has a normal mood and affect. His behavior is normal.  Vitals reviewed.   RECENT LABS AND TESTS: BMET    Component Value Date/Time   NA 136 12/27/2016 1125   K 4.2 12/27/2016 1125   CL 99 12/27/2016 1125   CO2 21 12/27/2016 1125   GLUCOSE 82 12/27/2016 1125   GLUCOSE 197 (H) 10/01/2016 1027   BUN 11 12/27/2016 1125   CREATININE 0.96 12/27/2016 1125   CALCIUM 9.3 12/27/2016 1125   GFRNONAA 89 12/27/2016 1125   GFRAA 102 12/27/2016 1125   Lab Results  Component Value Date   HGBA1C 6.0 (H) 12/27/2016   HGBA1C 7.4 (H) 10/01/2016   HGBA1C 7.7 (H) 06/29/2016   HGBA1C 6.7 (H) 03/23/2016   HGBA1C 6.8 (H) 09/28/2015   Lab Results  Component Value Date   INSULIN 53.0 (H) 12/27/2016   CBC    Component Value Date/Time   WBC 6.9 12/27/2016 1125   WBC 6.8 10/01/2016 1027   RBC 4.75 12/27/2016 1125   RBC 4.86 10/01/2016 1027   HGB 15.6 12/27/2016 1125   HCT 44.4 12/27/2016 1125   PLT 212 12/27/2016 1125   MCV 94 12/27/2016 1125   MCH 32.8 12/27/2016 1125   MCH 31.7 11/25/2014 0435   MCHC 35.1 12/27/2016 1125   MCHC 34.9 10/01/2016 1027   RDW 14.8 12/27/2016 1125   LYMPHSABS  2.3 12/27/2016 1125   MONOABS 0.4 10/01/2016 1027   EOSABS 0.1 12/27/2016 1125   BASOSABS 0.0 12/27/2016 1125   Iron/TIBC/Ferritin/ %Sat No results found for: IRON, TIBC, FERRITIN, IRONPCTSAT Lipid Panel     Component Value Date/Time   CHOL 144 12/27/2016 1125   TRIG 174 (H) 12/27/2016 1125   HDL 30 (L) 12/27/2016 1125   CHOLHDL 4 10/01/2016 1027   VLDL 33.8 10/01/2016 1027  LDLCALC 79 12/27/2016 1125   LDLDIRECT 93.3 04/02/2014 1632   Hepatic Function Panel     Component Value Date/Time   PROT 6.9 12/27/2016 1125   ALBUMIN 4.3 12/27/2016 1125   AST 19 12/27/2016 1125   ALT 20 12/27/2016 1125   ALKPHOS 66 12/27/2016 1125   BILITOT 0.9 12/27/2016 1125   BILIDIR 0.2 10/01/2016 1027      Component Value Date/Time   TSH 3.180 12/27/2016 1125   TSH 2.60 10/01/2016 1027   TSH 3.26 06/29/2016 1205    ASSESSMENT AND PLAN: Type 2 diabetes mellitus without complication, without long-term current use of insulin (HCC) - Plan: Insulin Pen Needle 32G X 4 MM MISC  At risk for heart disease  Class 2 severe obesity with serious comorbidity and body mass index (BMI) of 35.0 to 35.9 in adult, unspecified obesity type ([place])  PLAN:  Diabetes II Joel Medina has been given extensive diabetes education by myself today including ideal fasting and post-prandial blood glucose readings, individual ideal Hgb A1c goals  and hypoglycemia prevention. We discussed the importance of good blood sugar control to decrease the likelihood of diabetic complications such as nephropathy, neuropathy, limb loss, blindness, coronary artery disease, and death. We discussed the importance of intensive lifestyle modification including diet, exercise and weight loss as the first line treatment for diabetes. Jia agrees to continue Victoza at 0.9 mg, we will refill pens and needles for 1 month and he will follow up at the agreed upon time.  Cardiovascular risk counseling Joel Medina was given extended (15 minutes) coronary  artery disease prevention counseling today. He is 56 y.o. male and has risk factors for heart disease including obesity and diabetes. We discussed intensive lifestyle modifications today with an emphasis on specific weight loss instructions and strategies. Pt was also informed of the importance of increasing exercise and decreasing saturated fats to help prevent heart disease.  Obesity Joel Medina is currently in the action stage of change. As such, his goal is to continue with weight loss efforts He has agreed to follow the Category 3 plan Joel Medina has been instructed to work up to a goal of 150 minutes of combined cardio and strengthening exercise per week for weight loss and overall health benefits. We discussed the following Behavioral Modification Strategies today: no skipping meals, increasing lean protein intake, decreasing simple carbohydrates , work on meal planning and easy cooking plans, dealing with family or coworker sabotage and emotional eating strategies  Joel Medina has agreed to follow up with our clinic in 2 to 3 weeks. He was informed of the importance of frequent follow up visits to maximize his success with intensive lifestyle modifications for his multiple health conditions.  I, Doreene Nest, am acting as transcriptionist for Dennard Nip, MD  I have reviewed the above documentation for accuracy and completeness, and I agree with the above. -Dennard Nip, MD   OBESITY BEHAVIORAL INTERVENTION VISIT  Today's visit was # 6 out of 22.  Starting weight: 327 lbs Starting date: 12/27/16 Today's weight : 286 lbs  Today's date: 03/20/2017 Total lbs lost to date: 60 (Patients must lose 7 lbs in the first 6 months to continue with counseling)   ASK: We discussed the diagnosis of obesity with Joel Medina today and Ben agreed to give [place] permission to discuss obesity behavioral modification therapy today.  ASSESS: Joel Medina has the diagnosis of obesity and his BMI today is  35.75 Joel Medina is in the action stage of change   ADVISE: Joel Medina was educated  on the multiple health risks of obesity as well as the benefit of weight loss to improve his health. He was advised of the need for long term treatment and the importance of lifestyle modifications.  AGREE: Multiple dietary modification options and treatment options were discussed and  Joel Medina agreed to follow the Category 3 plan We discussed the following Behavioral Modification Strategies today: no skipping meals, increasing lean protein intake, decreasing simple carbohydrates , work on meal planning and easy cooking plans, dealing with family or coworker sabotage and emotional eating strategies

## 2017-03-30 ENCOUNTER — Other Ambulatory Visit (INDEPENDENT_AMBULATORY_CARE_PROVIDER_SITE_OTHER): Payer: Self-pay | Admitting: Family Medicine

## 2017-03-30 DIAGNOSIS — E559 Vitamin D deficiency, unspecified: Secondary | ICD-10-CM

## 2017-04-03 ENCOUNTER — Ambulatory Visit (INDEPENDENT_AMBULATORY_CARE_PROVIDER_SITE_OTHER): Payer: 59 | Admitting: Family Medicine

## 2017-04-03 VITALS — BP 94/64 | HR 76 | Temp 98.0°F | Ht 75.0 in | Wt 287.0 lb

## 2017-04-03 DIAGNOSIS — E559 Vitamin D deficiency, unspecified: Secondary | ICD-10-CM | POA: Diagnosis not present

## 2017-04-03 DIAGNOSIS — E782 Mixed hyperlipidemia: Secondary | ICD-10-CM | POA: Diagnosis not present

## 2017-04-03 DIAGNOSIS — Z9189 Other specified personal risk factors, not elsewhere classified: Secondary | ICD-10-CM | POA: Diagnosis not present

## 2017-04-03 DIAGNOSIS — E1165 Type 2 diabetes mellitus with hyperglycemia: Secondary | ICD-10-CM | POA: Diagnosis not present

## 2017-04-03 DIAGNOSIS — I10 Essential (primary) hypertension: Secondary | ICD-10-CM

## 2017-04-03 DIAGNOSIS — Z6835 Body mass index (BMI) 35.0-35.9, adult: Secondary | ICD-10-CM | POA: Diagnosis not present

## 2017-04-03 NOTE — Progress Notes (Signed)
Office: [phone]  /  Fax: [phone]   HPI:   Chief Complaint: OBESITY Joel Medina is here to discuss his progress with his obesity treatment plan. He is on  the Category 3 plan and is following his eating plan approximately 80 % of the time. He states he is walking for 60 minutes 3 to 4 times per week. Joel Medina has done well maintaining his weight but he has been deviating more with increased snacks and increased eating out, although he is making good choices and is trying to control his portions. His weight is 287 lb (130.2 kg) today and has had a weight gain of 1 pound over a period of 2 weeks since his last visit. He has lost 40 lbs since starting treatment with [place].  Vitamin D deficiency Joel Medina has a diagnosis of vitamin D deficiency. He is currently taking vit D and denies nausea, vomiting or muscle weakness.  Diabetes II Joel Medina has a diagnosis of diabetes type II. Joel Medina states fasting BGs range between 114 and 137 and denies any hypoglycemic episodes. He has been working on intensive lifestyle modifications including diet, exercise, and weight loss to help control his blood glucose levels.  Hypertension Joel Medina is a 56 y.o. male with hypertension. His blood pressure decreased with weight loss and he started to feel lightheaded. Joel Medina denies any falls, chest pain or shortness of breath on exertion. He is working weight loss to help control his blood pressure with the goal of decreasing his risk of heart attack and stroke. [place] blood pressure is not currently controlled.  Hyperlipidemia Joel Medina has hyperlipidemia and is not currently on statin. He has been attempting to control his cholesterol levels with intensive lifestyle modification including a low saturated fat diet, exercise and weight loss. He denies any chest pain, claudication or myalgias.  At risk for cardiovascular disease Joel Medina is at a higher than average risk for cardiovascular disease due to obesity,  hypertension, hyperlipidemia and diabetes. He currently denies any chest pain.   ALLERGIES: Allergies  Allergen Reactions  . Amlodipine     Edema with 10 mg/d    MEDICATIONS: Current Outpatient Prescriptions on File Prior to Visit  Medication Sig Dispense Refill  . Capsicum, Cayenne, (CAYENNE FRUIT) 455 MG CAPS Take by mouth.    . Cholecalciferol (D3 ADULT PO) Take 25 mcg by mouth.    . Cyanocobalamin (B-12) 2500 MCG TABS Take by mouth.    Marland Kitchen glucose blood test strip Use as instructed 100 each 0  . ibuprofen (ADVIL,MOTRIN) 800 MG tablet Take 1 tablet (800 mg total) by mouth every 8 (eight) hours as needed. 30 tablet 1  . Insulin Pen Needle 32G X 4 MM MISC Inject 1 Package into the skin every morning. 100 each 0  . Lancets (ACCU-CHEK SAFE-T PRO) lancets Use as instructed 100 each 0  . liraglutide 18 MG/3ML SOPN Inject 0.2 mLs (1.2 mg total) into the skin every morning. 2 pen 0  . metFORMIN (GLUCOPHAGE) 1000 MG tablet TAKE 1 TABLET (1,000 MG TOTAL) BY MOUTH 2 (TWO) TIMES DAILY WITH A MEAL. 180 tablet 3  . telmisartan (MICARDIS) 80 MG tablet Take 1 tablet (80 mg total) by mouth daily. 90 tablet 3  . triamterene-hydrochlorothiazide (MAXZIDE-25) 37.5-25 MG tablet Take 1 tablet by mouth daily. (Patient taking differently: Take 1 tablet by mouth daily. Take 1/2 pill daily) 90 tablet 3  . Turmeric 500 MG TABS Take by mouth.    . Vitamin D, Ergocalciferol, (DRISDOL) 50000  units CAPS capsule Take 1 capsule (50,000 Units total) by mouth every 7 (seven) days. 4 capsule 0   No current facility-administered medications on file prior to visit.     PAST MEDICAL HISTORY: Past Medical History:  Diagnosis Date  . Arthritis    OA BOTH HIPS - PAIN IN LEFT HIP WORSE  . Back pain   . Diabetes mellitus without complication (HCC)    oral meds  . History of pyelonephritis   . HTN (hypertension)   . Joint pain   . Obesity   . Prediabetes   . Swelling    feet and legs    PAST SURGICAL  HISTORY: Past Surgical History:  Procedure Laterality Date  . [place]  . RIGHT ULNAR NERVE SURGERY TO REMOVE SCAR TISSUE  ? 2006  . TOTAL HIP ARTHROPLASTY Left 02/04/2013   Procedure: LEFT TOTAL HIP ARTHROPLASTY;  Surgeon: [name], MD;  Location: [facility];  Service: Orthopedics;  Laterality: Left;  . TOTAL HIP ARTHROPLASTY Right 11/24/2014   Procedure: RIGHT TOTAL HIP ARTHROPLASTY ANTERIOR APPROACH;  Surgeon: Gaynelle Arabian, MD;  Location: [facility];  Service: Orthopedics;  Laterality: Right;  . WISDOM TEETH EXTRACTIONS  ? 55  . WISDOM TOOTH EXTRACTION      SOCIAL HISTORY: Social History  Substance Use Topics  . Smoking status: Never Smoker  . Smokeless tobacco: Never Used  . Alcohol use 7.2 oz/week    12 Cans of beer per week    FAMILY HISTORY: Family History  Problem Relation Age of Onset  . Hypertension Father   . Obesity Father   . Hypertension Mother   . Cancer Mother   . Depression Mother   . Obesity Mother   . Hypertension Other   . Colon cancer Neg Hx   . Colon polyps Neg Hx   . Esophageal cancer Neg Hx   . Rectal cancer Neg Hx   . Stomach cancer Neg Hx     ROS: Review of Systems  Constitutional: Negative for weight loss.  Respiratory: Negative for shortness of breath (on exertion).   Cardiovascular: Negative for chest pain and claudication.  Gastrointestinal: Negative for nausea and vomiting.  Musculoskeletal: Negative for myalgias.       Negative muscle weakness  Neurological:       Positive lightheadedness    PHYSICAL EXAM: Blood pressure 94/64, pulse 76, temperature 98 F (36.7 C), temperature source Oral, height 6\' 3"  (1.905 m), weight 287 lb (130.2 kg), SpO2 96 %. Body mass index is 35.87 kg/m. Physical Exam  Constitutional: He is oriented to person, place, and time. He appears well-developed and well-nourished.  Cardiovascular: Normal rate.   Pulmonary/Chest: Effort normal.  Musculoskeletal: Normal range of motion.   Neurological: He is oriented to person, place, and time.  Skin: Skin is warm and dry.  Psychiatric: He has a normal mood and affect. His behavior is normal.  Vitals reviewed.   RECENT LABS AND TESTS: BMET    Component Value Date/Time   NA 136 12/27/2016 1125   K 4.2 12/27/2016 1125   CL 99 12/27/2016 1125   CO2 21 12/27/2016 1125   GLUCOSE 82 12/27/2016 1125   GLUCOSE 197 (H) 10/01/2016 1027   BUN 11 12/27/2016 1125   CREATININE 0.96 12/27/2016 1125   CALCIUM 9.3 12/27/2016 1125   GFRNONAA 89 12/27/2016 1125   GFRAA 102 12/27/2016 1125   Lab Results  Component Value Date   HGBA1C 6.0 (H) 12/27/2016   HGBA1C  7.4 (H) 10/01/2016   HGBA1C 7.7 (H) 06/29/2016   HGBA1C 6.7 (H) 03/23/2016   HGBA1C 6.8 (H) 09/28/2015   Lab Results  Component Value Date   INSULIN 53.0 (H) 12/27/2016   CBC    Component Value Date/Time   WBC 6.9 12/27/2016 1125   WBC 6.8 10/01/2016 1027   RBC 4.75 12/27/2016 1125   RBC 4.86 10/01/2016 1027   HGB 15.6 12/27/2016 1125   HCT 44.4 12/27/2016 1125   PLT 212 12/27/2016 1125   MCV 94 12/27/2016 1125   MCH 32.8 12/27/2016 1125   MCH 31.7 11/25/2014 0435   MCHC 35.1 12/27/2016 1125   MCHC 34.9 10/01/2016 1027   RDW 14.8 12/27/2016 1125   LYMPHSABS 2.3 12/27/2016 1125   MONOABS 0.4 10/01/2016 1027   EOSABS 0.1 12/27/2016 1125   BASOSABS 0.0 12/27/2016 1125   Iron/TIBC/Ferritin/ %Sat No results found for: IRON, TIBC, FERRITIN, IRONPCTSAT Lipid Panel     Component Value Date/Time   CHOL 144 12/27/2016 1125   TRIG 174 (H) 12/27/2016 1125   HDL 30 (L) 12/27/2016 1125   CHOLHDL 4 10/01/2016 1027   VLDL 33.8 10/01/2016 1027   LDLCALC 79 12/27/2016 1125   LDLDIRECT 93.3 04/02/2014 1632   Hepatic Function Panel     Component Value Date/Time   PROT 6.9 12/27/2016 1125   ALBUMIN 4.3 12/27/2016 1125   AST 19 12/27/2016 1125   ALT 20 12/27/2016 1125   ALKPHOS 66 12/27/2016 1125   BILITOT 0.9 12/27/2016 1125   BILIDIR 0.2 10/01/2016 1027       Component Value Date/Time   TSH 3.180 12/27/2016 1125   TSH 2.60 10/01/2016 1027   TSH 3.26 06/29/2016 1205    ASSESSMENT AND PLAN: Essential hypertension - Plan: Comprehensive metabolic panel  Vitamin D deficiency - Plan: VITAMIN D 25 Hydroxy (Vit-D Deficiency, Fractures)  Mixed hyperlipidemia - Plan: Lipid Panel With LDL/HDL Ratio  Type 2 diabetes mellitus with hyperglycemia, without long-term current use of insulin (HCC) - Plan: Hemoglobin A1c, Insulin, random  At risk for heart disease  Class 2 severe obesity with serious comorbidity and body mass index (BMI) of 35.0 to 35.9 in adult, unspecified obesity type ([place])  PLAN:  Vitamin D Deficiency Joel Medina was informed that low vitamin D levels contributes to fatigue and are associated with obesity, breast, and colon cancer. He agrees to continue to take prescription Vit D @50 ,000 IU every week #4 with no refills. We will check labs and will follow up for routine testing of vitamin D, at least 2-3 times per year. He was informed of the risk of over-replacement of vitamin D and agrees to not increase his dose unless he discusses this with [place] first. Emani agrees to follow up with our clinic in 2 weeks.  Diabetes II Joel Medina has been given extensive diabetes education by myself today including ideal fasting and post-prandial blood glucose readings, individual ideal Hgb A1c goals and hypoglycemia prevention. We discussed the importance of good blood sugar control to decrease the likelihood of diabetic complications such as nephropathy, neuropathy, limb loss, blindness, coronary artery disease, and death. We discussed the importance of intensive lifestyle modification including diet, exercise and weight loss as the first line treatment for diabetes. We will check labs and Joel Medina agrees to continue Victoza, we will refill for 1 month. Joel Medina agrees to follow up at the agreed upon time.  Hypertension We discussed sodium restriction, working  on healthy weight loss, and a regular exercise program as the means  to achieve improved blood pressure control. Joel Medina agreed with this plan and agreed to follow up as directed. We will continue to monitor his blood pressure as well as his progress with the above lifestyle modifications. He will continue Micardis as prescribed and agrees to decrease Maxzide to 1/2 dose daily and will watch for signs of hypotension as he continues his lifestyle modifications.  Hyperlipidemia Joel Medina was informed of the American Heart Association Guidelines emphasizing intensive lifestyle modifications as the first line treatment for hyperlipidemia. We discussed many lifestyle modifications today in depth, and Joel Medina will continue to work on decreasing saturated fats such as fatty red meat, butter and many fried foods. He will also increase vegetables and lean protein in his diet and continue to work on exercise and weight loss efforts. We will check labs and Mako agrees to follow up with our clinic in 2 weeks.  Cardiovascular risk counseling Emilo was given extended (15 minutes) coronary artery disease prevention counseling today. He is 56 y.o. male and has risk factors for heart disease including obesity, hypertension, hyperlipidemia and diabetes. We discussed intensive lifestyle modifications today with an emphasis on specific weight loss instructions and strategies. Pt was also informed of the importance of increasing exercise and decreasing saturated fats to help prevent heart disease.  Obesity Benson is currently in the action stage of change. As such, his goal is to continue with weight loss efforts He has agreed to follow a lower carbohydrate, vegetable and lean protein rich diet plan Hilman has been instructed to work up to a goal of 150 minutes of combined cardio and strengthening exercise per week for weight loss and overall health benefits. We discussed the following Behavioral Modification Strategies today:  no skipping meals, better snacking choices, increasing lean protein intake and decrease eating out  Selim has agreed to follow up with our clinic in 2 weeks. He was informed of the importance of frequent follow up visits to maximize his success with intensive lifestyle modifications for his multiple health conditions.  I, Doreene Nest, am acting as transcriptionist for Dennard Nip, MD  I have reviewed the above documentation for accuracy and completeness, and I agree with the above. -Dennard Nip, MD   OBESITY BEHAVIORAL INTERVENTION VISIT  Today's visit was # 7 out of 22.  Starting weight: 327 lbs Starting date: 12/27/16 Today's weight : 327 lbs  Today's date: 04/03/2017 Total lbs lost to date: 29 (Patients must lose 7 lbs in the first 6 months to continue with counseling)   ASK: We discussed the diagnosis of obesity with Joel Medina today and Raheel agreed to give [place] permission to discuss obesity behavioral modification therapy today.  ASSESS: Kejuan has the diagnosis of obesity and his BMI today is 35.87 Jairus is in the action stage of change   ADVISE: Ronaldo was educated on the multiple health risks of obesity as well as the benefit of weight loss to improve his health. He was advised of the need for long term treatment and the importance of lifestyle modifications.  AGREE: Multiple dietary modification options and treatment options were discussed and  Inocencio agreed to follow the Category 3 plan We discussed the following Behavioral Modification Strategies today: no skipping meals, better snacking choices, increasing lean protein intake and decrease eating out

## 2017-04-04 LAB — COMPREHENSIVE METABOLIC PANEL
A/G RATIO: 1.6 (ref 1.2–2.2)
ALBUMIN: 4.2 g/dL (ref 3.5–5.5)
ALT: 17 IU/L (ref 0–44)
AST: 17 IU/L (ref 0–40)
Alkaline Phosphatase: 72 IU/L (ref 39–117)
BILIRUBIN TOTAL: 0.8 mg/dL (ref 0.0–1.2)
BUN/Creatinine Ratio: 17 (ref 9–20)
BUN: 16 mg/dL (ref 6–24)
CALCIUM: 9.7 mg/dL (ref 8.7–10.2)
CHLORIDE: 101 mmol/L (ref 96–106)
CO2: 23 mmol/L (ref 20–29)
Creatinine, Ser: 0.95 mg/dL (ref 0.76–1.27)
GFR, EST AFRICAN AMERICAN: 104 mL/min/{1.73_m2} (ref >59)
GFR, EST NON AFRICAN AMERICAN: 90 mL/min/{1.73_m2} (ref >59)
Globulin, Total: 2.6 g/dL (ref 1.5–4.5)
Glucose: 112 mg/dL — ABNORMAL HIGH (ref 65–99)
POTASSIUM: 4.3 mmol/L (ref 3.5–5.2)
Sodium: 139 mmol/L (ref 134–144)
TOTAL PROTEIN: 6.8 g/dL (ref 6.0–8.5)

## 2017-04-04 LAB — HEMOGLOBIN A1C
Est. average glucose Bld gHb Est-mCnc: 103 mg/dL
HEMOGLOBIN A1C: 5.2 % (ref 4.8–5.6)

## 2017-04-04 LAB — LIPID PANEL WITH LDL/HDL RATIO
Cholesterol, Total: 145 mg/dL (ref 100–199)
HDL: 31 mg/dL — ABNORMAL LOW (ref >39)
LDL CALC: 84 mg/dL (ref 0–99)
LDL/HDL RATIO: 2.7 ratio (ref 0.0–3.6)
TRIGLYCERIDES: 152 mg/dL — AB (ref 0–149)
VLDL CHOLESTEROL CAL: 30 mg/dL (ref 5–40)

## 2017-04-04 LAB — VITAMIN D 25 HYDROXY (VIT D DEFICIENCY, FRACTURES): Vit D, 25-Hydroxy: 35.1 ng/mL (ref 30.0–100.0)

## 2017-04-04 LAB — INSULIN, RANDOM: INSULIN: 34.5 u[IU]/mL — ABNORMAL HIGH (ref 2.6–24.9)

## 2017-04-08 ENCOUNTER — Other Ambulatory Visit (INDEPENDENT_AMBULATORY_CARE_PROVIDER_SITE_OTHER): Payer: Self-pay | Admitting: Family Medicine

## 2017-04-08 DIAGNOSIS — Z23 Encounter for immunization: Secondary | ICD-10-CM | POA: Diagnosis not present

## 2017-04-08 DIAGNOSIS — E119 Type 2 diabetes mellitus without complications: Secondary | ICD-10-CM

## 2017-04-12 ENCOUNTER — Other Ambulatory Visit (INDEPENDENT_AMBULATORY_CARE_PROVIDER_SITE_OTHER): Payer: Self-pay | Admitting: Family Medicine

## 2017-04-12 DIAGNOSIS — E119 Type 2 diabetes mellitus without complications: Secondary | ICD-10-CM

## 2017-04-17 ENCOUNTER — Ambulatory Visit (INDEPENDENT_AMBULATORY_CARE_PROVIDER_SITE_OTHER): Payer: 59 | Admitting: Family Medicine

## 2017-04-17 VITALS — BP 101/68 | HR 72 | Temp 98.0°F | Ht 75.0 in | Wt 282.0 lb

## 2017-04-17 DIAGNOSIS — E119 Type 2 diabetes mellitus without complications: Secondary | ICD-10-CM

## 2017-04-17 DIAGNOSIS — E559 Vitamin D deficiency, unspecified: Secondary | ICD-10-CM

## 2017-04-17 DIAGNOSIS — Z6835 Body mass index (BMI) 35.0-35.9, adult: Secondary | ICD-10-CM

## 2017-04-17 DIAGNOSIS — Z9189 Other specified personal risk factors, not elsewhere classified: Secondary | ICD-10-CM | POA: Diagnosis not present

## 2017-04-17 MED ORDER — VITAMIN D (ERGOCALCIFEROL) 1.25 MG (50000 UNIT) PO CAPS: 50000.0000 [IU] | ORAL_CAPSULE | ORAL | 0 refills | Status: DC

## 2017-04-17 MED ORDER — LIRAGLUTIDE 18 MG/3ML ~~LOC~~ SOPN: 1.2000 mg | PEN_INJECTOR | Freq: Every morning | SUBCUTANEOUS | 0 refills | Status: DC

## 2017-04-18 NOTE — Progress Notes (Signed)
Office: [phone]  /  Fax: [phone]   HPI:   Chief Complaint: OBESITY Joel Medina is here to discuss his progress with his obesity treatment plan. Joel Medina is on the Category 3 plan and is following his eating plan approximately 85 % of the time. Joel Medina states Joel Medina is walking (2 to 3.5 miles) for 60 minutes 2 times per week. Joel Medina is back on track with the category 3 plan and has decreased extra snacking but is still struggling with cravings and hunger. His weight is 282 lb (127.9 kg) today and has had a weight loss of 5 pounds over a period of 2 weeks since his last visit. Joel Medina has lost 45 lbs since starting treatment with [place].  Vitamin D deficiency Joel Medina has a diagnosis of vitamin D deficiency. Joel Medina is currently stable on vit D, not yet at goal and denies nausea, vomiting or muscle weakness.  Diabetes II Joel Medina has a diagnosis of diabetes type II. Joel Medina is on Victoza but is still struggling with polyphagia. Joel Medina denies any hypoglycemic episodes, nausea or vomiting. Joel Medina has been working on intensive lifestyle modifications including diet, exercise, and weight loss to help control his blood glucose levels.  At risk for cardiovascular disease Joel Medina is at a higher than average risk for cardiovascular disease due to obesity and diabetes. Joel Medina currently denies any chest pain.   ALLERGIES: Allergies  Allergen Reactions  . Amlodipine     Edema with 10 mg/d    MEDICATIONS: Current Outpatient Prescriptions on File Prior to Visit  Medication Sig Dispense Refill  . Capsicum, Cayenne, (CAYENNE FRUIT) 455 MG CAPS Take by mouth.    . Cholecalciferol (D3 ADULT PO) Take 25 mcg by mouth.    . Cyanocobalamin (B-12) 2500 MCG TABS Take by mouth.    Marland Kitchen glucose blood test strip Use as instructed 100 each 0  . ibuprofen (ADVIL,MOTRIN) 800 MG tablet Take 1 tablet (800 mg total) by mouth every 8 (eight) hours as needed. 30 tablet 1  . Insulin Pen Needle 32G X 4 MM MISC Inject 1 Package into the skin every morning. 100  each 0  . Lancets (ACCU-CHEK SAFE-T PRO) lancets Use as instructed 100 each 0  . metFORMIN (GLUCOPHAGE) 1000 MG tablet TAKE 1 TABLET (1,000 MG TOTAL) BY MOUTH 2 (TWO) TIMES DAILY WITH A MEAL. 180 tablet 3  . telmisartan (MICARDIS) 80 MG tablet Take 1 tablet (80 mg total) by mouth daily. 90 tablet 3  . triamterene-hydrochlorothiazide (MAXZIDE-25) 37.5-25 MG tablet Take 1 tablet by mouth daily. (Patient taking differently: Take 1 tablet by mouth daily. Take 1/2 pill daily) 90 tablet 3  . Turmeric 500 MG TABS Take by mouth.     No current facility-administered medications on file prior to visit.     PAST MEDICAL HISTORY: Past Medical History:  Diagnosis Date  . Arthritis    OA BOTH HIPS - PAIN IN LEFT HIP WORSE  . Back pain   . Diabetes mellitus without complication (HCC)    oral meds  . History of pyelonephritis   . HTN (hypertension)   . Joint pain   . Obesity   . Prediabetes   . Swelling    feet and legs    PAST SURGICAL HISTORY: Past Surgical History:  Procedure Laterality Date  . [place]  . RIGHT ULNAR NERVE SURGERY TO REMOVE SCAR TISSUE  ? 2006  . TOTAL HIP ARTHROPLASTY Left 02/04/2013   Procedure: LEFT TOTAL HIP ARTHROPLASTY;  Surgeon: Dione Plover  Aluisio, MD;  Location: [facility] ORS;  Service: Orthopedics;  Laterality: Left;  . TOTAL HIP ARTHROPLASTY Right 11/24/2014   Procedure: RIGHT TOTAL HIP ARTHROPLASTY ANTERIOR APPROACH;  Surgeon: Gaynelle Arabian, MD;  Location: [facility] OR[facility];  Service: Orthopedics;  Laterality: Right;  . WISDOM TEETH EXTRACTIONS  ? 77  . WISDOM TOOTH EXTRACTION      SOCIAL HISTORY: Social History  Substance Use Topics  . Smoking status: Never Smoker  . Smokeless tobacco: Never Used  . Alcohol use 7.2 oz/week    12 Cans of beer per week    FAMILY HISTORY: Family History  Problem Relation Age of Onset  . Hypertension Father   . Obesity Father   . Hypertension Mother   . Cancer Mother   . Depression Mother   . Obesity Mother   .  Hypertension Other   . Colon cancer Neg Hx   . Colon polyps Neg Hx   . Esophageal cancer Neg Hx   . Rectal cancer Neg Hx   . Stomach cancer Neg Hx     ROS: Review of Systems  Constitutional: Positive for weight loss.  Cardiovascular: Negative for chest pain.  Gastrointestinal: Negative for nausea and vomiting.  Musculoskeletal:       Negative muscle weakness  Endo/Heme/Allergies:       Positive polyphagia Negative hypoglycemia    PHYSICAL EXAM: Blood pressure 101/68, pulse 72, temperature 98 F (36.7 C), temperature source Oral, height 6\' 3"  (1.905 m), weight 282 lb (127.9 kg), SpO2 99 %. Body mass index is 35.25 kg/m. Physical Exam  Constitutional: Joel Medina is oriented to person, place, and time. Joel Medina appears well-developed and well-nourished.  Cardiovascular: Normal rate.   Pulmonary/Chest: Effort normal.  Genitourinary: Penis normal.  Musculoskeletal: Normal range of motion.  Neurological: Joel Medina is alert and oriented to person, place, and time.  Skin: Skin is warm and dry.  Psychiatric: Joel Medina has a normal mood and affect. His behavior is normal.  Vitals reviewed.   RECENT LABS AND TESTS: BMET    Component Value Date/Time   NA 139 04/03/2017 0815   K 4.3 04/03/2017 0815   CL 101 04/03/2017 0815   CO2 23 04/03/2017 0815   GLUCOSE 112 (H) 04/03/2017 0815   GLUCOSE 197 (H) 10/01/2016 1027   BUN 16 04/03/2017 0815   CREATININE 0.95 04/03/2017 0815   CALCIUM 9.7 04/03/2017 0815   GFRNONAA 90 04/03/2017 0815   GFRAA 104 04/03/2017 0815   Lab Results  Component Value Date   HGBA1C 5.2 04/03/2017   HGBA1C 6.0 (H) 12/27/2016   HGBA1C 7.4 (H) 10/01/2016   HGBA1C 7.7 (H) 06/29/2016   HGBA1C 6.7 (H) 03/23/2016   Lab Results  Component Value Date   INSULIN 34.5 (H) 04/03/2017   INSULIN 53.0 (H) 12/27/2016   CBC    Component Value Date/Time   WBC 6.9 12/27/2016 1125   WBC 6.8 10/01/2016 1027   RBC 4.75 12/27/2016 1125   RBC 4.86 10/01/2016 1027   HGB 15.6 12/27/2016  1125   HCT 44.4 12/27/2016 1125   PLT 212 12/27/2016 1125   MCV 94 12/27/2016 1125   MCH 32.8 12/27/2016 1125   MCH 31.7 11/25/2014 0435   MCHC 35.1 12/27/2016 1125   MCHC 34.9 10/01/2016 1027   RDW 14.8 12/27/2016 1125   LYMPHSABS 2.3 12/27/2016 1125   MONOABS 0.4 10/01/2016 1027   EOSABS 0.1 12/27/2016 1125   BASOSABS 0.0 12/27/2016 1125   Iron/TIBC/Ferritin/ %Sat No results found for: IRON, TIBC, FERRITIN, IRONPCTSAT Lipid  Panel     Component Value Date/Time   CHOL 145 04/03/2017 0815   TRIG 152 (H) 04/03/2017 0815   HDL 31 (L) 04/03/2017 0815   CHOLHDL 4 10/01/2016 1027   VLDL 33.8 10/01/2016 1027   LDLCALC 84 04/03/2017 0815   LDLDIRECT 93.3 04/02/2014 1632   Hepatic Function Panel     Component Value Date/Time   PROT 6.8 04/03/2017 0815   ALBUMIN 4.2 04/03/2017 0815   AST 17 04/03/2017 0815   ALT 17 04/03/2017 0815   ALKPHOS 72 04/03/2017 0815   BILITOT 0.8 04/03/2017 0815   BILIDIR 0.2 10/01/2016 1027      Component Value Date/Time   TSH 3.180 12/27/2016 1125   TSH 2.60 10/01/2016 1027   TSH 3.26 06/29/2016 1205    ASSESSMENT AND PLAN: Type 2 diabetes mellitus without complication, without long-term current use of insulin (Joel Medina) - Plan: liraglutide 18 MG/3ML SOPN  Vitamin D deficiency - Plan: Vitamin D, Ergocalciferol, (DRISDOL) 50000 units CAPS capsule  At risk for heart disease  Class 2 severe obesity with serious comorbidity and body mass index (BMI) of 35.0 to 35.9 in adult, unspecified obesity type ([place])  PLAN:  Vitamin D Deficiency Joel Medina was informed that low vitamin D levels contributes to fatigue and are associated with obesity, breast, and colon cancer. Joel Medina agrees to continue to take prescription Vit D @50 ,000 IU every week #4 with no refills and will follow up for routine testing of vitamin D, at least 2-3 times per year. Joel Medina was informed of the risk of over-replacement of vitamin D and agrees to not increase his dose unless Joel Medina discusses this  with [place] first. Thoren agrees to follow up with our clinic in 3 weeks.  Diabetes II Joel Medina has been given extensive diabetes education by myself today including ideal fasting and post-prandial blood glucose readings, individual ideal Hgb A1c goals  and hypoglycemia prevention. We discussed the importance of good blood sugar control to decrease the likelihood of diabetic complications such as nephropathy, neuropathy, limb loss, blindness, coronary artery disease, and death. We discussed the importance of intensive lifestyle modification including diet, exercise and weight loss as the first line treatment for diabetes. Joel Medina agrees to increase Victoza to 1.2 mg qd #2 pen with no refills and will follow up at the agreed upon time.  Cardiovascular risk counseling Joel Medina was given extended (15 minutes) coronary artery disease prevention counseling today. Joel Medina is 56 y.o. male and has risk factors for heart disease including obesity and diabetes. We discussed intensive lifestyle modifications today with an emphasis on specific weight loss instructions and strategies. Pt was also informed of the importance of increasing exercise and decreasing saturated fats to help prevent heart disease.  Obesity Joel Medina is currently in the action stage of change. As such, his goal is to continue with weight loss efforts Joel Medina has agreed to follow the Category 3 plan Joel Medina has been instructed to work up to a goal of 150 minutes of combined cardio and strengthening exercise per week for weight loss and overall health benefits. We discussed the following Behavioral Modification Strategies today: better snacking choices and keep a strict food journal, increasing lean protein intake, decreasing simple carbohydrates , increasing vegetables, work on meal planning and easy cooking plans and emotional eating strategies  Joel Medina has agreed to follow up with our clinic in 3 weeks. Joel Medina was informed of the importance of frequent follow up  visits to maximize his success with intensive lifestyle modifications for his multiple health  conditions.  I, Joel Medina, am acting as transcriptionist for Dennard Nip, MD  I have reviewed the above documentation for accuracy and completeness, and I agree with the above. -Dennard Nip, MD    OBESITY BEHAVIORAL INTERVENTION VISIT  Today's visit was # 8 out of 22.  Starting weight: 327 lbs Starting date: 12/27/16 Today's weight : 282 lbs Today's date: 04/17/2017 Total lbs lost to date: 78 (Patients must lose 7 lbs in the first 6 months to continue with counseling)   ASK: We discussed the diagnosis of obesity with Joel Medina today and Joel Medina agreed to give [place] permission to discuss obesity behavioral modification therapy today.  ASSESS: Joel Medina has the diagnosis of obesity and his BMI today is 35.25 Joel Medina is in the action stage of change   ADVISE: Joel Medina was educated on the multiple health risks of obesity as well as the benefit of weight loss to improve his health. Joel Medina was advised of the need for long term treatment and the importance of lifestyle modifications.  AGREE: Multiple dietary modification options and treatment options were discussed and  Joel Medina agreed to follow the Category 3 plan We discussed the following Behavioral Modification Strategies today: better snacking choices, keep a strict food journal, increasing lean protein intake, decreasing simple carbohydrates , increasing vegetables, work on meal planning and easy cooking plans and emotional eating strategies

## 2017-05-06 ENCOUNTER — Encounter (INDEPENDENT_AMBULATORY_CARE_PROVIDER_SITE_OTHER): Payer: Self-pay | Admitting: Family Medicine

## 2017-05-06 ENCOUNTER — Ambulatory Visit (INDEPENDENT_AMBULATORY_CARE_PROVIDER_SITE_OTHER): Payer: 59 | Admitting: Family Medicine

## 2017-05-06 VITALS — BP 102/69 | HR 69 | Temp 97.4°F | Ht 75.0 in | Wt 283.0 lb

## 2017-05-06 DIAGNOSIS — E119 Type 2 diabetes mellitus without complications: Secondary | ICD-10-CM | POA: Diagnosis not present

## 2017-05-06 DIAGNOSIS — I10 Essential (primary) hypertension: Secondary | ICD-10-CM | POA: Diagnosis not present

## 2017-05-06 DIAGNOSIS — Z6835 Body mass index (BMI) 35.0-35.9, adult: Secondary | ICD-10-CM | POA: Diagnosis not present

## 2017-05-06 MED ORDER — LIRAGLUTIDE 18 MG/3ML ~~LOC~~ SOPN: 1.2000 mg | PEN_INJECTOR | Freq: Every morning | SUBCUTANEOUS | 0 refills | Status: DC

## 2017-05-07 NOTE — Progress Notes (Signed)
Office: [phone]  /  Fax: [phone]   HPI:   Chief Complaint: OBESITY Joel Medina is here to discuss his progress with his obesity treatment plan. He is on the Category 3 plan and is following his eating plan approximately 60 % of the time. He states he is walking for 35 minutes 3 times per week. [name] had increase celebration eating. He continues to be mindful of his eating and makes smarter food choices. He would like holiday eating strategies.  His weight is 283 lb (128.4 kg) today and has gained 1 pound since his last visit. He has lost 44 lbs since starting treatment with [place].  Diabetes II [name] has a diagnosis of diabetes type II. [name] states BGs range between 110's and 130's and denies any hypoglycemic episodes. Last A1c was 5.2 on 04/03/17. He has been working on intensive lifestyle modifications including diet, exercise, and weight loss to help control his blood glucose levels.  Hypertension [name] is a 56 y.o. male with hypertension. Anthony's blood pressure is stable and he denies chest pain or shortness of breath. He is working weight loss to help control his blood pressure with the goal of decreasing his risk of heart attack and stroke. [name]'s blood pressure is not currently controlled.  At risk for cardiovascular disease [name] is at a higher than average risk for cardiovascular disease due to obesity and hypertension. He currently denies any chest pain.  ALLERGIES: Allergies  Allergen Reactions  . Amlodipine     Edema with 10 mg/d    MEDICATIONS: Current Outpatient Medications on File Prior to Visit  Medication Sig Dispense Refill  . Capsicum, Cayenne, (CAYENNE FRUIT) 455 MG CAPS Take by mouth.    . Cholecalciferol (D3 ADULT PO) Take 25 mcg by mouth.    . Cyanocobalamin (B-12) 2500 MCG TABS Take by mouth.    Marland Kitchen glucose blood test strip Use as instructed 100 each 0  . ibuprofen (ADVIL,MOTRIN) 800 MG tablet Take 1 tablet (800 mg total) by mouth every 8  (eight) hours as needed. 30 tablet 1  . Insulin Pen Needle 32G X 4 MM MISC Inject 1 Package into the skin every morning. 100 each 0  . Lancets (ACCU-CHEK SAFE-T PRO) lancets Use as instructed 100 each 0  . metFORMIN (GLUCOPHAGE) 1000 MG tablet TAKE 1 TABLET (1,000 MG TOTAL) BY MOUTH 2 (TWO) TIMES DAILY WITH A MEAL. 180 tablet 3  . telmisartan (MICARDIS) 80 MG tablet Take 1 tablet (80 mg total) by mouth daily. 90 tablet 3  . triamterene-hydrochlorothiazide (MAXZIDE-25) 37.5-25 MG tablet Take 1 tablet by mouth daily. (Patient taking differently: Take 1 tablet by mouth daily. Take 1/2 pill daily) 90 tablet 3  . Turmeric 500 MG TABS Take by mouth.    . Vitamin D, Ergocalciferol, (DRISDOL) 50000 units CAPS capsule Take 1 capsule (50,000 Units total) by mouth every 7 (seven) days. 4 capsule 0   No current facility-administered medications on file prior to visit.     PAST MEDICAL HISTORY: Past Medical History:  Diagnosis Date  . Arthritis    OA BOTH HIPS - PAIN IN LEFT HIP WORSE  . Back pain   . Diabetes mellitus without complication (HCC)    oral meds  . History of pyelonephritis   . HTN (hypertension)   . Joint pain   . Obesity   . Prediabetes   . Swelling    feet and legs    PAST SURGICAL HISTORY: Past Surgical History:  Procedure Laterality  Date  . [place]  . LEFT TOTAL HIP ARTHROPLASTY Left 02/04/2013   Performed by [name], MD at [facility] ORS  . RIGHT TOTAL HIP ARTHROPLASTY ANTERIOR APPROACH Right 11/24/2014   Performed by Gaynelle Arabian, MD at [facility] ORS  . RIGHT ULNAR NERVE SURGERY TO REMOVE SCAR TISSUE  ? 2006  . WISDOM TEETH EXTRACTIONS  ? 8  . WISDOM TOOTH EXTRACTION      SOCIAL HISTORY: Social History   Tobacco Use  . Smoking status: Never Smoker  . Smokeless tobacco: Never Used  Substance Use Topics  . Alcohol use: Yes    Alcohol/week: 7.2 oz    Types: 12 Cans of beer per week  . Drug use: No    FAMILY HISTORY: Family History  Problem  Relation Age of Onset  . Hypertension Father   . Obesity Father   . Hypertension Mother   . Cancer Mother   . Depression Mother   . Obesity Mother   . Hypertension Other   . Colon cancer Neg Hx   . Colon polyps Neg Hx   . Esophageal cancer Neg Hx   . Rectal cancer Neg Hx   . Stomach cancer Neg Hx     ROS: Review of Systems  Constitutional: Negative for weight loss.  Respiratory: Negative for shortness of breath.   Cardiovascular: Negative for chest pain.  Endo/Heme/Allergies:       Negative hypoglycemia    PHYSICAL EXAM: Blood pressure 102/69, pulse 69, temperature (!) 97.4 F (36.3 C), temperature source Oral, height 6\' 3"  (1.905 m), weight 283 lb (128.4 kg), SpO2 98 %. Body mass index is 35.37 kg/m. Physical Exam  Constitutional: He is oriented to person, place, and time. He appears well-developed and well-nourished.  Cardiovascular: Normal rate.  Pulmonary/Chest: Effort normal.  Musculoskeletal: Normal range of motion.  Neurological: He is oriented to person, place, and time.  Skin: Skin is warm and dry.  Psychiatric: He has a normal mood and affect. His behavior is normal.  Vitals reviewed.   RECENT LABS AND TESTS: BMET    Component Value Date/Time   NA 139 04/03/2017 0815   K 4.3 04/03/2017 0815   CL 101 04/03/2017 0815   CO2 23 04/03/2017 0815   GLUCOSE 112 (H) 04/03/2017 0815   GLUCOSE 197 (H) 10/01/2016 1027   BUN 16 04/03/2017 0815   CREATININE 0.95 04/03/2017 0815   CALCIUM 9.7 04/03/2017 0815   GFRNONAA 90 04/03/2017 0815   GFRAA 104 04/03/2017 0815   Lab Results  Component Value Date   HGBA1C 5.2 04/03/2017   HGBA1C 6.0 (H) 12/27/2016   HGBA1C 7.4 (H) 10/01/2016   HGBA1C 7.7 (H) 06/29/2016   HGBA1C 6.7 (H) 03/23/2016   Lab Results  Component Value Date   INSULIN 34.5 (H) 04/03/2017   INSULIN 53.0 (H) 12/27/2016   CBC    Component Value Date/Time   WBC 6.9 12/27/2016 1125   WBC 6.8 10/01/2016 1027   RBC 4.75 12/27/2016 1125   RBC  4.86 10/01/2016 1027   HGB 15.6 12/27/2016 1125   HCT 44.4 12/27/2016 1125   PLT 212 12/27/2016 1125   MCV 94 12/27/2016 1125   MCH 32.8 12/27/2016 1125   MCH 31.7 11/25/2014 0435   MCHC 35.1 12/27/2016 1125   MCHC 34.9 10/01/2016 1027   RDW 14.8 12/27/2016 1125   LYMPHSABS 2.3 12/27/2016 1125   MONOABS 0.4 10/01/2016 1027   EOSABS 0.1 12/27/2016 1125   BASOSABS 0.0 12/27/2016  1125   Iron/TIBC/Ferritin/ %Sat No results found for: IRON, TIBC, FERRITIN, IRONPCTSAT Lipid Panel     Component Value Date/Time   CHOL 145 04/03/2017 0815   TRIG 152 (H) 04/03/2017 0815   HDL 31 (L) 04/03/2017 0815   CHOLHDL 4 10/01/2016 1027   VLDL 33.8 10/01/2016 1027   LDLCALC 84 04/03/2017 0815   LDLDIRECT 93.3 04/02/2014 1632   Hepatic Function Panel     Component Value Date/Time   PROT 6.8 04/03/2017 0815   ALBUMIN 4.2 04/03/2017 0815   AST 17 04/03/2017 0815   ALT 17 04/03/2017 0815   ALKPHOS 72 04/03/2017 0815   BILITOT 0.8 04/03/2017 0815   BILIDIR 0.2 10/01/2016 1027      Component Value Date/Time   TSH 3.180 12/27/2016 1125   TSH 2.60 10/01/2016 1027   TSH 3.26 06/29/2016 1205    ASSESSMENT AND PLAN: Type 2 diabetes mellitus without complication, without long-term current use of insulin ([place]) - Plan: liraglutide 18 MG/3ML SOPN  Essential hypertension  Class 2 severe obesity with serious comorbidity and body mass index (BMI) of 35.0 to 35.9 in adult, unspecified obesity type ([place])  PLAN:  Diabetes II Jasun has been given extensive diabetes education by myself today including ideal fasting and post-prandial blood glucose readings, individual ideal HgA1c goals  and hypoglycemia prevention. We discussed the importance of good blood sugar control to decrease the likelihood of diabetic complications such as nephropathy, neuropathy, limb loss, blindness, coronary artery disease, and death. We discussed the importance of intensive lifestyle modification including diet, exercise and  weight loss as the first line treatment for diabetes. Keevan agrees to continue Victoza 2 pens (Pt. at 1.2 mg) and we will refill for 1 month. Braydon agrees to follow up with our clinic in 2 weeks.  Hypertension We discussed sodium restriction, working on healthy weight loss, and a regular exercise program as the means to achieve improved blood pressure control. Kanden agreed with this plan and agreed to follow up as directed. We will continue to monitor his blood pressure as well as his progress with the above lifestyle modifications. He will continue his medications as prescribed and will watch for signs of hypotension as he continues his lifestyle modifications. Harrel agrees to follow up with our clinic in 2 weeks.  Cardiovascular risk counselling Jeremian was given extended (15 minutes) coronary artery disease prevention counseling today. He is 56 y.o. male and has risk factors for heart disease including obesity and hypertension. We discussed intensive lifestyle modifications today with an emphasis on specific weight loss instructions and strategies. Keshon was also informed of the importance of increasing exercise and decreasing saturated fats to help prevent heart disease.  Obesity Metro is currently in the action stage of change. As such, his goal is to continue with weight loss efforts He has agreed to keep a food journal with 600 calories and 40 grams of protein at supper daily and follow the Category 3 plan Adien has been instructed to work up to a goal of 150 minutes of combined cardio and strengthening exercise per week for weight loss and overall health benefits. We discussed the following Behavioral Modification Strategies today: increasing lean protein intake, holiday eating strategies, and keep a strict food journal   Natasha has agreed to follow up with our clinic in 2 weeks. He was informed of the importance of frequent follow up visits to maximize his success with intensive  lifestyle modifications for his multiple health conditions.  Wilhemena Durie, am  acting as transcriptionist for Lacy Duverney, PA-C  I have reviewed the above documentation for accuracy and completeness, and I agree with the above. -Lacy Duverney, PA-C  I have reviewed the above note and agree with the plan. -Dennard Nip, MD      Today's visit was # 9 out of 22.  Starting weight: 327 lbs Starting date: 12/27/16 Today's weight : 283 lbs  Today's date: 05/06/2017 Total lbs lost to date: 46 (Patients must lose 7 lbs in the first 6 months to continue with counseling)   ASK: We discussed the diagnosis of obesity with [name] today and Benyamin agreed to give [place] permission to discuss obesity behavioral modification therapy today.  ASSESS: Wendall has the diagnosis of obesity and his BMI today is 35.37 Skylur is in the action stage of change   ADVISE: Maricela was educated on the multiple health risks of obesity as well as the benefit of weight loss to improve his health. He was advised of the need for long term treatment and the importance of lifestyle modifications.  AGREE: Multiple dietary modification options and treatment options were discussed and  Rylie agreed to keep a food journal with 600 calories and 40 grams of protein at supper daily and follow the Category 3 plan We discussed the following Behavioral Modification Strategies today: increasing lean protein intake, holiday eating strategies, and keep a strict food journal

## 2017-05-21 ENCOUNTER — Ambulatory Visit (INDEPENDENT_AMBULATORY_CARE_PROVIDER_SITE_OTHER): Payer: 59 | Admitting: Physician Assistant

## 2017-05-21 VITALS — BP 110/77 | HR 74 | Ht 75.0 in | Wt 284.0 lb

## 2017-05-21 DIAGNOSIS — I10 Essential (primary) hypertension: Secondary | ICD-10-CM

## 2017-05-21 DIAGNOSIS — Z9189 Other specified personal risk factors, not elsewhere classified: Secondary | ICD-10-CM

## 2017-05-21 DIAGNOSIS — E559 Vitamin D deficiency, unspecified: Secondary | ICD-10-CM | POA: Diagnosis not present

## 2017-05-21 DIAGNOSIS — Z6835 Body mass index (BMI) 35.0-35.9, adult: Secondary | ICD-10-CM

## 2017-05-21 NOTE — Progress Notes (Signed)
Office: [phone]  /  Fax: [phone]   HPI:   Chief Complaint: OBESITY Joel Medina is here to discuss his progress with his obesity treatment plan. He is on the keep a food journal with 600 calories and 40 grams of protein at supper daily and the Category 3 plan and is following his eating plan approximately 80 % of the time. He states he is exercising 0 minutes 0 times per week. Joel Medina eliminates all carbohydrates and states he skips lunch daily to avoid weight gain. Joel Medina states he has not been following his meal plan and states his hunger is not well controlled. His weight is 284 lb (128.8 kg) today and has had a weight gain of 1 pound over a period of 2 weeks since his last visit. He has lost 34 lbs since starting treatment with [place].  Vitamin D deficiency Joel Medina has a diagnosis of vitamin D deficiency. He is currently taking vit D and denies nausea, vomiting or muscle weakness.  Hypertension Joel Medina is a 56 y.o. male with hypertension. Joel Medina denies chest pain or shortness of breath on exertion. He is working weight loss to help control his blood pressure with the goal of decreasing his risk of heart attack and stroke. [place] blood pressure is currently stable.  At risk for cardiovascular disease Joel Medina is at a higher than average risk for cardiovascular disease due to obesity and hypertension. He currently denies any chest pain.  ALLERGIES: Allergies  Allergen Reactions  . Amlodipine     Edema with 10 mg/d    MEDICATIONS: Current Outpatient Medications on File Prior to Visit  Medication Sig Dispense Refill  . Capsicum, Cayenne, (CAYENNE FRUIT) 455 MG CAPS Take by mouth.    . Cholecalciferol (D3 ADULT PO) Take 25 mcg by mouth.    . Cyanocobalamin (B-12) 2500 MCG TABS Take by mouth.    Marland Kitchen glucose blood test strip Use as instructed 100 each 0  . ibuprofen (ADVIL,MOTRIN) 800 MG tablet Take 1 tablet (800 mg total) by mouth every 8 (eight) hours as needed. 30  tablet 1  . Insulin Pen Needle 32G X 4 MM MISC Inject 1 Package into the skin every morning. 100 each 0  . Lancets (ACCU-CHEK SAFE-T PRO) lancets Use as instructed 100 each 0  . liraglutide 18 MG/3ML SOPN Inject 0.2 mLs (1.2 mg total) every morning into the skin. 2 pen 0  . metFORMIN (GLUCOPHAGE) 1000 MG tablet TAKE 1 TABLET (1,000 MG TOTAL) BY MOUTH 2 (TWO) TIMES DAILY WITH A MEAL. 180 tablet 3  . telmisartan (MICARDIS) 80 MG tablet Take 1 tablet (80 mg total) by mouth daily. 90 tablet 3  . triamterene-hydrochlorothiazide (MAXZIDE-25) 37.5-25 MG tablet Take 1 tablet by mouth daily. (Patient taking differently: Take 1 tablet by mouth daily. Take 1/2 pill daily) 90 tablet 3  . Turmeric 500 MG TABS Take by mouth.    . Vitamin D, Ergocalciferol, (DRISDOL) 50000 units CAPS capsule Take 1 capsule (50,000 Units total) by mouth every 7 (seven) days. 4 capsule 0   No current facility-administered medications on file prior to visit.     PAST MEDICAL HISTORY: Past Medical History:  Diagnosis Date  . Arthritis    OA BOTH HIPS - PAIN IN LEFT HIP WORSE  . Back pain   . Diabetes mellitus without complication (HCC)    oral meds  . History of pyelonephritis   . HTN (hypertension)   . Joint pain   . Obesity   .  Prediabetes   . Swelling    feet and legs    PAST SURGICAL HISTORY: Past Surgical History:  Procedure Laterality Date  . [place]  . RIGHT ULNAR NERVE SURGERY TO REMOVE SCAR TISSUE  ? 2006  . TOTAL HIP ARTHROPLASTY Left 02/04/2013   Procedure: LEFT TOTAL HIP ARTHROPLASTY;  Surgeon: [name], MD;  Location: [facility];  Service: Orthopedics;  Laterality: Left;  . TOTAL HIP ARTHROPLASTY Right 11/24/2014   Procedure: RIGHT TOTAL HIP ARTHROPLASTY ANTERIOR APPROACH;  Surgeon: Gaynelle Arabian, MD;  Location: [facility];  Service: Orthopedics;  Laterality: Right;  . WISDOM TEETH EXTRACTIONS  ? 51  . WISDOM TOOTH EXTRACTION      SOCIAL HISTORY: Social History   Tobacco Use    . Smoking status: Never Smoker  . Smokeless tobacco: Never Used  Substance Use Topics  . Alcohol use: Yes    Alcohol/week: 7.2 oz    Types: 12 Cans of beer per week  . Drug use: No    FAMILY HISTORY: Family History  Problem Relation Age of Onset  . Hypertension Father   . Obesity Father   . Hypertension Mother   . Cancer Mother   . Depression Mother   . Obesity Mother   . Hypertension Other   . Colon cancer Neg Hx   . Colon polyps Neg Hx   . Esophageal cancer Neg Hx   . Rectal cancer Neg Hx   . Stomach cancer Neg Hx     ROS: Review of Systems  Constitutional: Negative for weight loss.  Respiratory: Negative for shortness of breath (on exertion).   Cardiovascular: Negative for chest pain.  Gastrointestinal: Negative for nausea and vomiting.  Musculoskeletal:       Negative muscle weakness    PHYSICAL EXAM: Blood pressure 110/77, pulse 74, height 6\' 3"  (1.905 m), weight 284 lb (128.8 kg), SpO2 98 %. Body mass index is 35.5 kg/m. Physical Exam  Constitutional: He is oriented to person, place, and time. He appears well-developed and well-nourished.  Cardiovascular: Normal rate.  Pulmonary/Chest: Effort normal.  Musculoskeletal: Normal range of motion.  Neurological: He is oriented to person, place, and time.  Skin: Skin is warm and dry.  Psychiatric: He has a normal mood and affect. His behavior is normal.  Vitals reviewed.   RECENT LABS AND TESTS: BMET    Component Value Date/Time   NA 139 04/03/2017 0815   K 4.3 04/03/2017 0815   CL 101 04/03/2017 0815   CO2 23 04/03/2017 0815   GLUCOSE 112 (H) 04/03/2017 0815   GLUCOSE 197 (H) 10/01/2016 1027   BUN 16 04/03/2017 0815   CREATININE 0.95 04/03/2017 0815   CALCIUM 9.7 04/03/2017 0815   GFRNONAA 90 04/03/2017 0815   GFRAA 104 04/03/2017 0815   Lab Results  Component Value Date   HGBA1C 5.2 04/03/2017   HGBA1C 6.0 (H) 12/27/2016   HGBA1C 7.4 (H) 10/01/2016   HGBA1C 7.7 (H) 06/29/2016   HGBA1C 6.7  (H) 03/23/2016   Lab Results  Component Value Date   INSULIN 34.5 (H) 04/03/2017   INSULIN 53.0 (H) 12/27/2016   CBC    Component Value Date/Time   WBC 6.9 12/27/2016 1125   WBC 6.8 10/01/2016 1027   RBC 4.75 12/27/2016 1125   RBC 4.86 10/01/2016 1027   HGB 15.6 12/27/2016 1125   HCT 44.4 12/27/2016 1125   PLT 212 12/27/2016 1125   MCV 94 12/27/2016 1125   MCH 32.8 12/27/2016 1125  MCH 31.7 11/25/2014 0435   MCHC 35.1 12/27/2016 1125   MCHC 34.9 10/01/2016 1027   RDW 14.8 12/27/2016 1125   LYMPHSABS 2.3 12/27/2016 1125   MONOABS 0.4 10/01/2016 1027   EOSABS 0.1 12/27/2016 1125   BASOSABS 0.0 12/27/2016 1125   Iron/TIBC/Ferritin/ %Sat No results found for: IRON, TIBC, FERRITIN, IRONPCTSAT Lipid Panel     Component Value Date/Time   CHOL 145 04/03/2017 0815   TRIG 152 (H) 04/03/2017 0815   HDL 31 (L) 04/03/2017 0815   CHOLHDL 4 10/01/2016 1027   VLDL 33.8 10/01/2016 1027   LDLCALC 84 04/03/2017 0815   LDLDIRECT 93.3 04/02/2014 1632   Hepatic Function Panel     Component Value Date/Time   PROT 6.8 04/03/2017 0815   ALBUMIN 4.2 04/03/2017 0815   AST 17 04/03/2017 0815   ALT 17 04/03/2017 0815   ALKPHOS 72 04/03/2017 0815   BILITOT 0.8 04/03/2017 0815   BILIDIR 0.2 10/01/2016 1027      Component Value Date/Time   TSH 3.180 12/27/2016 1125   TSH 2.60 10/01/2016 1027   TSH 3.26 06/29/2016 1205    ASSESSMENT AND PLAN: Vitamin D deficiency - Plan: Vitamin D, Ergocalciferol, (DRISDOL) 50000 units CAPS capsule  Essential hypertension  At risk for heart disease  Class 2 severe obesity with serious comorbidity and body mass index (BMI) of 35.0 to 35.9 in adult, unspecified obesity type ([place])  PLAN:  Vitamin D Deficiency Joel Medina was informed that low vitamin D levels contributes to fatigue and are associated with obesity, breast, and colon cancer. He agrees to continue to take prescription Vit D @50 ,000 IU every week #4 with no refills and will follow up for  routine testing of vitamin D, at least 2-3 times per year. He was informed of the risk of over-replacement of vitamin D and agrees to not increase his dose unless he discusses this with [place] first. Joel Medina agrees to follow up with our clinic in 2 weeks.  Hypertension We discussed sodium restriction, working on healthy weight loss, and a regular exercise program as the means to achieve improved blood pressure control. Joel Medina agreed with this plan and agreed to follow up as directed. We will continue to monitor his blood pressure as well as his progress with the above lifestyle modifications. He will continue his medications as prescribed and will watch for signs of hypotension as he continues his lifestyle modifications.  Cardiovascular risk counseling Joel Medina was given extended (15 minutes) coronary artery disease prevention counseling today. He is 56 y.o. male and has risk factors for heart disease including obesity and hypertension. We discussed intensive lifestyle modifications today with an emphasis on specific weight loss instructions and strategies. Pt was also informed of the importance of increasing exercise and decreasing saturated fats to help prevent heart disease.  Obesity Joel Medina is currently in the action stage of change. As such, his goal is to continue with weight loss efforts He has agreed to follow the Category 3 plan Joel Medina has been instructed to work up to a goal of 150 minutes of combined cardio and strengthening exercise per week for weight loss and overall health benefits. We discussed the following Behavioral Modification Strategies today: increasing lean protein intake and no skipping meals  Joel Medina has agreed to follow up with our clinic in 2 weeks. He was informed of the importance of frequent follow up visits to maximize his success with intensive lifestyle modifications for his multiple health conditions.  Joel Medina, am acting as transcriptionist  for Joel Duverney,  PA-C  I have reviewed the above documentation for accuracy and completeness, and I agree with the above. -Joel Duverney, PA-C  I have reviewed the above note and agree with the plan. -Joel Nip, MD   OBESITY BEHAVIORAL INTERVENTION VISIT  Today's visit was # 10 out of 22.  Starting weight: 327 lbs Starting date: 12/27/16 Today's weight : 284 lbs Today's date: 05/21/2017 Total lbs lost to date: 23 (Patients must lose 7 lbs in the first 6 months to continue with counseling)   ASK: We discussed the diagnosis of obesity with Joel Medina today and Buddie agreed to give [place] permission to discuss obesity behavioral modification therapy today.  ASSESS: Joel Medina has the diagnosis of obesity and his BMI today is 35.5 Joel Medina is in the action stage of change   ADVISE: Joel Medina was educated on the multiple health risks of obesity as well as the benefit of weight loss to improve his health. He was advised of the need for long term treatment and the importance of lifestyle modifications.  AGREE: Multiple dietary modification options and treatment options were discussed and  Lewis agreed to follow the Category 3 plan We discussed the following Behavioral Modification Strategies today: increasing lean protein intake and no skipping meals

## 2017-05-22 MED ORDER — VITAMIN D (ERGOCALCIFEROL) 1.25 MG (50000 UNIT) PO CAPS: 50000.0000 [IU] | ORAL_CAPSULE | ORAL | 0 refills | Status: DC

## 2017-06-03 ENCOUNTER — Encounter (INDEPENDENT_AMBULATORY_CARE_PROVIDER_SITE_OTHER): Payer: Self-pay | Admitting: Family Medicine

## 2017-06-03 ENCOUNTER — Ambulatory Visit (INDEPENDENT_AMBULATORY_CARE_PROVIDER_SITE_OTHER): Payer: 59 | Admitting: Family Medicine

## 2017-06-03 VITALS — BP 112/78 | HR 68 | Temp 97.5°F | Ht 75.0 in | Wt 291.0 lb

## 2017-06-03 DIAGNOSIS — E119 Type 2 diabetes mellitus without complications: Secondary | ICD-10-CM | POA: Diagnosis not present

## 2017-06-03 DIAGNOSIS — Z9189 Other specified personal risk factors, not elsewhere classified: Secondary | ICD-10-CM | POA: Diagnosis not present

## 2017-06-03 DIAGNOSIS — Z6836 Body mass index (BMI) 36.0-36.9, adult: Secondary | ICD-10-CM | POA: Diagnosis not present

## 2017-06-03 MED ORDER — LIRAGLUTIDE 18 MG/3ML ~~LOC~~ SOPN: 1.2000 mg | PEN_INJECTOR | Freq: Every morning | SUBCUTANEOUS | 0 refills | Status: DC

## 2017-06-03 NOTE — Progress Notes (Signed)
Office: [phone]  /  Fax: [phone]   HPI:   Chief Complaint: OBESITY Derick is here to discuss his progress with his obesity treatment plan. He is on the Category 3 plan and is following his eating plan approximately 70 % of the time. He states he is walking for 30 minutes 3 times per week. Berlyn is struggling with increased cravings and increased eating out as well as increased celebration eating with the birth of his 4th grandchild. He is retaining fluid due to increased salt and simple carbohydrates. His weight is 291 lb (132 kg) today and has had a weight gain of 7 pounds over a period of 2 weeks since his last visit. He has lost 36 lbs since starting treatment with [place].  Diabetes II Yuvraj has a diagnosis of diabetes type II. Karon is not checking BGs at home and denies any hypoglycemic episodes. Last A1c was well controlled at 5.2 He is stable on Victoza and denies nausea or vomiting. He has been working on intensive lifestyle modifications including diet, exercise, and weight loss to help control his blood glucose levels.  At risk for cardiovascular disease Coy is at a higher than average risk for cardiovascular disease due to obesity and diabetes. He currently denies any chest pain.  ALLERGIES: Allergies  Allergen Reactions  . Amlodipine     Edema with 10 mg/d    MEDICATIONS: Current Outpatient Medications on File Prior to Visit  Medication Sig Dispense Refill  . Capsicum, Cayenne, (CAYENNE FRUIT) 455 MG CAPS Take by mouth.    . Cholecalciferol (D3 ADULT PO) Take 25 mcg by mouth.    . Cyanocobalamin (B-12) 2500 MCG TABS Take by mouth.    Marland Kitchen glucose blood test strip Use as instructed 100 each 0  . ibuprofen (ADVIL,MOTRIN) 800 MG tablet Take 1 tablet (800 mg total) by mouth every 8 (eight) hours as needed. 30 tablet 1  . Insulin Pen Needle 32G X 4 MM MISC Inject 1 Package into the skin every morning. 100 each 0  . Lancets (ACCU-CHEK SAFE-T PRO) lancets Use as  instructed 100 each 0  . metFORMIN (GLUCOPHAGE) 1000 MG tablet TAKE 1 TABLET (1,000 MG TOTAL) BY MOUTH 2 (TWO) TIMES DAILY WITH A MEAL. 180 tablet 3  . telmisartan (MICARDIS) 80 MG tablet Take 1 tablet (80 mg total) by mouth daily. 90 tablet 3  . triamterene-hydrochlorothiazide (MAXZIDE-25) 37.5-25 MG tablet Take 1 tablet by mouth daily. (Patient taking differently: Take 1 tablet by mouth daily. Take 1/2 pill daily) 90 tablet 3  . Turmeric 500 MG TABS Take by mouth.    . Vitamin D, Ergocalciferol, (DRISDOL) 50000 units CAPS capsule Take 1 capsule (50,000 Units total) by mouth every 7 (seven) days. 4 capsule 0   No current facility-administered medications on file prior to visit.     PAST MEDICAL HISTORY: Past Medical History:  Diagnosis Date  . Arthritis    OA BOTH HIPS - PAIN IN LEFT HIP WORSE  . Back pain   . Diabetes mellitus without complication (HCC)    oral meds  . History of pyelonephritis   . HTN (hypertension)   . Joint pain   . Obesity   . Prediabetes   . Swelling    feet and legs    PAST SURGICAL HISTORY: Past Surgical History:  Procedure Laterality Date  . [place]  . RIGHT ULNAR NERVE SURGERY TO REMOVE SCAR TISSUE  ? 2006  . TOTAL HIP ARTHROPLASTY Left  02/04/2013   Procedure: LEFT TOTAL HIP ARTHROPLASTY;  Surgeon: [name], MD;  Location: [facility];  Service: Orthopedics;  Laterality: Left;  . TOTAL HIP ARTHROPLASTY Right 11/24/2014   Procedure: RIGHT TOTAL HIP ARTHROPLASTY ANTERIOR APPROACH;  Surgeon: Gaynelle Arabian, MD;  Location: [facility];  Service: Orthopedics;  Laterality: Right;  . WISDOM TEETH EXTRACTIONS  ? 80  . WISDOM TOOTH EXTRACTION      SOCIAL HISTORY: Social History   Tobacco Use  . Smoking status: Never Smoker  . Smokeless tobacco: Never Used  Substance Use Topics  . Alcohol use: Yes    Alcohol/week: 7.2 oz    Types: 12 Cans of beer per week  . Drug use: No    FAMILY HISTORY: Family History  Problem Relation Age of  Onset  . Hypertension Father   . Obesity Father   . Hypertension Mother   . Cancer Mother   . Depression Mother   . Obesity Mother   . Hypertension Other   . Colon cancer Neg Hx   . Colon polyps Neg Hx   . Esophageal cancer Neg Hx   . Rectal cancer Neg Hx   . Stomach cancer Neg Hx     ROS: Review of Systems  Constitutional: Negative for weight loss.  Cardiovascular: Negative for chest pain.  Gastrointestinal: Negative for nausea and vomiting.  Endo/Heme/Allergies:       Negative hypoglycemia    PHYSICAL EXAM: Blood pressure 112/78, pulse 68, temperature (!) 97.5 F (36.4 C), temperature source Oral, height 6\' 3"  (1.905 m), weight 291 lb (132 kg), SpO2 97 %. Body mass index is 36.37 kg/m. Physical Exam  Constitutional: He is oriented to person, place, and time. He appears well-developed and well-nourished.  Cardiovascular: Normal rate.  Pulmonary/Chest: Effort normal.  Musculoskeletal: Normal range of motion.  Neurological: He is oriented to person, place, and time.  Skin: Skin is warm and dry.  Vitals reviewed.   RECENT LABS AND TESTS: BMET    Component Value Date/Time   NA 139 04/03/2017 0815   K 4.3 04/03/2017 0815   CL 101 04/03/2017 0815   CO2 23 04/03/2017 0815   GLUCOSE 112 (H) 04/03/2017 0815   GLUCOSE 197 (H) 10/01/2016 1027   BUN 16 04/03/2017 0815   CREATININE 0.95 04/03/2017 0815   CALCIUM 9.7 04/03/2017 0815   GFRNONAA 90 04/03/2017 0815   GFRAA 104 04/03/2017 0815   Lab Results  Component Value Date   HGBA1C 5.2 04/03/2017   HGBA1C 6.0 (H) 12/27/2016   HGBA1C 7.4 (H) 10/01/2016   HGBA1C 7.7 (H) 06/29/2016   HGBA1C 6.7 (H) 03/23/2016   Lab Results  Component Value Date   INSULIN 34.5 (H) 04/03/2017   INSULIN 53.0 (H) 12/27/2016   CBC    Component Value Date/Time   WBC 6.9 12/27/2016 1125   WBC 6.8 10/01/2016 1027   RBC 4.75 12/27/2016 1125   RBC 4.86 10/01/2016 1027   HGB 15.6 12/27/2016 1125   HCT 44.4 12/27/2016 1125   PLT 212  12/27/2016 1125   MCV 94 12/27/2016 1125   MCH 32.8 12/27/2016 1125   MCH 31.7 11/25/2014 0435   MCHC 35.1 12/27/2016 1125   MCHC 34.9 10/01/2016 1027   RDW 14.8 12/27/2016 1125   LYMPHSABS 2.3 12/27/2016 1125   MONOABS 0.4 10/01/2016 1027   EOSABS 0.1 12/27/2016 1125   BASOSABS 0.0 12/27/2016 1125   Iron/TIBC/Ferritin/ %Sat No results found for: IRON, TIBC, FERRITIN, IRONPCTSAT Lipid Panel  Component Value Date/Time   CHOL 145 04/03/2017 0815   TRIG 152 (H) 04/03/2017 0815   HDL 31 (L) 04/03/2017 0815   CHOLHDL 4 10/01/2016 1027   VLDL 33.8 10/01/2016 1027   LDLCALC 84 04/03/2017 0815   LDLDIRECT 93.3 04/02/2014 1632   Hepatic Function Panel     Component Value Date/Time   PROT 6.8 04/03/2017 0815   ALBUMIN 4.2 04/03/2017 0815   AST 17 04/03/2017 0815   ALT 17 04/03/2017 0815   ALKPHOS 72 04/03/2017 0815   BILITOT 0.8 04/03/2017 0815   BILIDIR 0.2 10/01/2016 1027      Component Value Date/Time   TSH 3.180 12/27/2016 1125   TSH 2.60 10/01/2016 1027   TSH 3.26 06/29/2016 1205    ASSESSMENT AND PLAN: Type 2 diabetes mellitus without complication, without long-term current use of insulin ([place]) - Plan: liraglutide (VICTOZA) 18 MG/3ML SOPN  At risk for heart disease  Class 2 severe obesity with serious comorbidity and body mass index (BMI) of 36.0 to 36.9 in adult, unspecified obesity type ([place])  PLAN:  Diabetes II Shem has been given extensive diabetes education by myself today including ideal fasting and post-prandial blood glucose readings, individual ideal Hgb A1c goals  and hypoglycemia prevention. We discussed the importance of good blood sugar control to decrease the likelihood of diabetic complications such as nephropathy, neuropathy, limb loss, blindness, coronary artery disease, and death. We discussed the importance of intensive lifestyle modification including diet, exercise and weight loss as the first line treatment for diabetes. Lamario agrees to  continue Victoza at 1.2 mg #2 pen with no refills and will follow up at the agreed upon time.  Cardiovascular risk counseling Seung was given extended (15 minutes) coronary artery disease prevention counseling today. He is 56 y.o. male and has risk factors for heart disease including obesity and diabetes. We discussed intensive lifestyle modifications today with an emphasis on specific weight loss instructions and strategies. Pt was also informed of the importance of increasing exercise and decreasing saturated fats to help prevent heart disease.  Obesity Anothy is currently in the action stage of change. As such, his goal is to continue with weight loss efforts He has agreed to follow the Category 3 plan Trystin has been instructed to work up to a goal of 150 minutes of combined cardio and strengthening exercise per week for weight loss and overall health benefits. We discussed the following Behavioral Modification Strategies today: increase H2O intake, decreasing sodium intake, dealing with family or coworker sabotage and holiday eating strategies   Jaun has agreed to follow up with our clinic in 3 weeks. He was informed of the importance of frequent follow up visits to maximize his success with intensive lifestyle modifications for his multiple health conditions.  I, Doreene Nest, am acting as transcriptionist for Dennard Nip, MD  I have reviewed the above documentation for accuracy and completeness, and I agree with the above. -Dennard Nip, MD    OBESITY BEHAVIORAL INTERVENTION VISIT  Today's visit was # 11 out of 22.  Starting weight: 327 lbs Starting date: 12/27/16 Today's weight : 291 lbs  Today's date: 06/03/2017 Total lbs lost to date: 25 (Patients must lose 7 lbs in the first 6 months to continue with counseling)   ASK: We discussed the diagnosis of obesity with [name] today and Telford agreed to give [place] permission to discuss obesity behavioral modification  therapy today.  ASSESS: Clevon has the diagnosis of obesity and his BMI today  is 36.37 Cypher is in the action stage of change   ADVISE: [place] was educated on the multiple health risks of obesity as well as the benefit of weight loss to improve his health. He was advised of the need for long term treatment and the importance of lifestyle modifications.  AGREE: Multiple dietary modification options and treatment options were discussed and  Fernando agreed to follow the Category 3 plan We discussed the following Behavioral Modification Strategies today: increase H2O intake, decreasing sodium intake, dealing with family or coworker sabotage and holiday eating strategies

## 2017-06-04 ENCOUNTER — Ambulatory Visit (INDEPENDENT_AMBULATORY_CARE_PROVIDER_SITE_OTHER): Payer: 59 | Admitting: Physician Assistant

## 2017-06-24 DIAGNOSIS — J0100 Acute maxillary sinusitis, unspecified: Secondary | ICD-10-CM | POA: Diagnosis not present

## 2017-06-25 ENCOUNTER — Encounter (INDEPENDENT_AMBULATORY_CARE_PROVIDER_SITE_OTHER): Payer: Self-pay | Admitting: Family Medicine

## 2017-06-25 ENCOUNTER — Ambulatory Visit (INDEPENDENT_AMBULATORY_CARE_PROVIDER_SITE_OTHER): Payer: 59 | Admitting: Family Medicine

## 2017-06-25 VITALS — BP 115/75 | HR 88 | Temp 97.9°F | Ht 75.0 in | Wt 287.0 lb

## 2017-06-25 DIAGNOSIS — E119 Type 2 diabetes mellitus without complications: Secondary | ICD-10-CM

## 2017-06-25 DIAGNOSIS — Z6835 Body mass index (BMI) 35.0-35.9, adult: Secondary | ICD-10-CM | POA: Diagnosis not present

## 2017-06-25 MED ORDER — ACCU-CHEK SAFE-T PRO LANCETS MISC: 0 refills | Status: DC

## 2017-06-25 MED ORDER — GLUCOSE BLOOD VI STRP: ORAL_STRIP | 0 refills | Status: DC

## 2017-06-25 NOTE — Progress Notes (Signed)
Office: [phone]  /  Fax: [phone]   HPI:   Chief Complaint: OBESITY Joel Medina is here to discuss his progress with his obesity treatment plan. He is on the Category 3 plan and is following his eating plan approximately 0 % of the time. He states he is exercising 0 minutes 0 times per week. Joel Medina has done well with weight loss, even over the holidays. He has had increased temptations and snacking, but hunger is better controlled. His weight is 287 lb (130.2 kg) today and has had a weight loss of 4 pounds over a period of 3 weeks since his last visit. He has lost 40 lbs since starting treatment with [place].  Diabetes II Joel Medina has a diagnosis of diabetes type II. He increased Victoza to 1.2 at last visit and notes decreased hunger. His A1c is well controlled. Joel Medina denies any hypoglycemic episodes. He has been working on intensive lifestyle modifications including diet, exercise, and weight loss to help control his blood glucose levels.  ALLERGIES: Allergies  Allergen Reactions  . Amlodipine     Edema with 10 mg/d    MEDICATIONS: Current Outpatient Medications on File Prior to Visit  Medication Sig Dispense Refill  . Cyanocobalamin (B-12) 2500 MCG TABS Take by mouth.    Marland Kitchen glucose blood test strip Use as instructed 100 each 0  . Insulin Pen Needle 32G X 4 MM MISC Inject 1 Package into the skin every morning. 100 each 0  . Lancets (ACCU-CHEK SAFE-T PRO) lancets Use as instructed 100 each 0  . liraglutide (VICTOZA) 18 MG/3ML SOPN Inject 0.2 mLs (1.2 mg total) into the skin every morning. 2 pen 0  . metFORMIN (GLUCOPHAGE) 1000 MG tablet TAKE 1 TABLET (1,000 MG TOTAL) BY MOUTH 2 (TWO) TIMES DAILY WITH A MEAL. 180 tablet 3  . telmisartan (MICARDIS) 80 MG tablet Take 1 tablet (80 mg total) by mouth daily. 90 tablet 3  . triamterene-hydrochlorothiazide (MAXZIDE-25) 37.5-25 MG tablet Take 1 tablet by mouth daily. (Patient taking differently: Take 1 tablet by mouth daily. Take 1/2 pill  daily) 90 tablet 3  . Vitamin D, Ergocalciferol, (DRISDOL) 50000 units CAPS capsule Take 1 capsule (50,000 Units total) by mouth every 7 (seven) days. 4 capsule 0   No current facility-administered medications on file prior to visit.     PAST MEDICAL HISTORY: Past Medical History:  Diagnosis Date  . Arthritis    OA BOTH HIPS - PAIN IN LEFT HIP WORSE  . Back pain   . Diabetes mellitus without complication (HCC)    oral meds  . History of pyelonephritis   . HTN (hypertension)   . Joint pain   . Obesity   . Prediabetes   . Swelling    feet and legs    PAST SURGICAL HISTORY: Past Surgical History:  Procedure Laterality Date  . [place]  . RIGHT ULNAR NERVE SURGERY TO REMOVE SCAR TISSUE  ? 2006  . TOTAL HIP ARTHROPLASTY Left 02/04/2013   Procedure: LEFT TOTAL HIP ARTHROPLASTY;  Surgeon: [name], MD;  Location: [facility];  Service: Orthopedics;  Laterality: Left;  . TOTAL HIP ARTHROPLASTY Right 11/24/2014   Procedure: RIGHT TOTAL HIP ARTHROPLASTY ANTERIOR APPROACH;  Surgeon: Gaynelle Arabian, MD;  Location: [facility];  Service: Orthopedics;  Laterality: Right;  . WISDOM TEETH EXTRACTIONS  ? 30  . WISDOM TOOTH EXTRACTION      SOCIAL HISTORY: Social History   Tobacco Use  . Smoking status: Never Smoker  .  Smokeless tobacco: Never Used  Substance Use Topics  . Alcohol use: Yes    Alcohol/week: 7.2 oz    Types: 12 Cans of beer per week  . Drug use: No    FAMILY HISTORY: Family History  Problem Relation Age of Onset  . Hypertension Father   . Obesity Father   . Hypertension Mother   . Cancer Mother   . Depression Mother   . Obesity Mother   . Hypertension Other   . Colon cancer Neg Hx   . Colon polyps Neg Hx   . Esophageal cancer Neg Hx   . Rectal cancer Neg Hx   . Stomach cancer Neg Hx     ROS: Review of Systems  Constitutional: Positive for weight loss.  Endo/Heme/Allergies:       Negative hypoglycemia    PHYSICAL EXAM: Blood pressure  115/75, pulse 88, temperature 97.9 F (36.6 C), temperature source Oral, height 6\' 3"  (1.905 m), weight 287 lb (130.2 kg), SpO2 98 %. Body mass index is 35.87 kg/m. Physical Exam  Constitutional: He is oriented to person, place, and time. He appears well-developed and well-nourished.  Cardiovascular: Normal rate.  Pulmonary/Chest: Effort normal.  Musculoskeletal: Normal range of motion.  Neurological: He is oriented to person, place, and time.  Skin: Skin is warm and dry.  Psychiatric: He has a normal mood and affect. His behavior is normal.  Vitals reviewed.   RECENT LABS AND TESTS: BMET    Component Value Date/Time   NA 139 04/03/2017 0815   K 4.3 04/03/2017 0815   CL 101 04/03/2017 0815   CO2 23 04/03/2017 0815   GLUCOSE 112 (H) 04/03/2017 0815   GLUCOSE 197 (H) 10/01/2016 1027   BUN 16 04/03/2017 0815   CREATININE 0.95 04/03/2017 0815   CALCIUM 9.7 04/03/2017 0815   GFRNONAA 90 04/03/2017 0815   GFRAA 104 04/03/2017 0815   Lab Results  Component Value Date   HGBA1C 5.2 04/03/2017   HGBA1C 6.0 (H) 12/27/2016   HGBA1C 7.4 (H) 10/01/2016   HGBA1C 7.7 (H) 06/29/2016   HGBA1C 6.7 (H) 03/23/2016   Lab Results  Component Value Date   INSULIN 34.5 (H) 04/03/2017   INSULIN 53.0 (H) 12/27/2016   CBC    Component Value Date/Time   WBC 6.9 12/27/2016 1125   WBC 6.8 10/01/2016 1027   RBC 4.75 12/27/2016 1125   RBC 4.86 10/01/2016 1027   HGB 15.6 12/27/2016 1125   HCT 44.4 12/27/2016 1125   PLT 212 12/27/2016 1125   MCV 94 12/27/2016 1125   MCH 32.8 12/27/2016 1125   MCH 31.7 11/25/2014 0435   MCHC 35.1 12/27/2016 1125   MCHC 34.9 10/01/2016 1027   RDW 14.8 12/27/2016 1125   LYMPHSABS 2.3 12/27/2016 1125   MONOABS 0.4 10/01/2016 1027   EOSABS 0.1 12/27/2016 1125   BASOSABS 0.0 12/27/2016 1125   Iron/TIBC/Ferritin/ %Sat No results found for: IRON, TIBC, FERRITIN, IRONPCTSAT Lipid Panel     Component Value Date/Time   CHOL 145 04/03/2017 0815   TRIG 152 (H)  04/03/2017 0815   HDL 31 (L) 04/03/2017 0815   CHOLHDL 4 10/01/2016 1027   VLDL 33.8 10/01/2016 1027   LDLCALC 84 04/03/2017 0815   LDLDIRECT 93.3 04/02/2014 1632   Hepatic Function Panel     Component Value Date/Time   PROT 6.8 04/03/2017 0815   ALBUMIN 4.2 04/03/2017 0815   AST 17 04/03/2017 0815   ALT 17 04/03/2017 0815   ALKPHOS 72 04/03/2017 0815  BILITOT 0.8 04/03/2017 0815   BILIDIR 0.2 10/01/2016 1027      Component Value Date/Time   TSH 3.180 12/27/2016 1125   TSH 2.60 10/01/2016 1027   TSH 3.26 06/29/2016 1205    ASSESSMENT AND PLAN: Type 2 diabetes mellitus without complication, without long-term current use of insulin (HCC) - Plan: Lancets (ACCU-CHEK SAFE-T PRO) lancets, glucose blood test strip  Class 2 severe obesity with serious comorbidity and body mass index (BMI) of 35.0 to 35.9 in adult, unspecified obesity type ([place])  PLAN:  Diabetes II Joel Medina has been given extensive diabetes education by myself today including ideal fasting and post-prandial blood glucose readings, individual ideal Hgb A1c goals and hypoglycemia prevention. We discussed the importance of good blood sugar control to decrease the likelihood of diabetic complications such as nephropathy, neuropathy, limb loss, blindness, coronary artery disease, and death. We discussed the importance of intensive lifestyle modification including diet, exercise and weight loss as the first line treatment for diabetes. Joel Medina agrees to continue Victoza and Metformin and we will refill strips and lancets. Joel Medina agrees to follow up at the agreed upon time.   Obesity Joel Medina is currently in the action stage of change. As such, his goal is to continue with weight loss efforts He has agreed to follow the Category 3 plan Joel Medina has been instructed to work up to a goal of 150 minutes of combined cardio and strengthening exercise per week for weight loss and overall health benefits. We discussed the following  Behavioral Modification Strategies today: no skipping meals, work on meal planning and easy cooking plans  Joel Medina has agreed to follow up with our clinic in 2 to 3 weeks fasting. He was informed of the importance of frequent follow up visits to maximize his success with intensive lifestyle modifications for his multiple health conditions.   OBESITY BEHAVIORAL INTERVENTION VISIT  Today's visit was # 12 out of 22.  Starting weight: 327 lbs Starting date: 12/27/16 Today's weight : 287 lbs Today's date: 06/25/2017 Total lbs lost to date: 76 (Patients must lose 7 lbs in the first 6 months to continue with counseling)   ASK: We discussed the diagnosis of obesity with Joel Medina today and Joel Medina agreed to give [place] permission to discuss obesity behavioral modification therapy today.  ASSESS: Joel Medina has the diagnosis of obesity and his BMI today is 35.87 Joel Medina is in the action stage of change   ADVISE: Joel Medina was educated on the multiple health risks of obesity as well as the benefit of weight loss to improve his health. He was advised of the need for long term treatment and the importance of lifestyle modifications.  AGREE: Multiple dietary modification options and treatment options were discussed and  Joel Medina agreed to the above obesity treatment plan.  I, Doreene Nest, am acting as transcriptionist for Dennard Nip, MD  I have reviewed the above documentation for accuracy and completeness, and I agree with the above. -Dennard Nip, MD

## 2017-07-10 ENCOUNTER — Ambulatory Visit (INDEPENDENT_AMBULATORY_CARE_PROVIDER_SITE_OTHER): Payer: 59 | Admitting: Physician Assistant

## 2017-07-10 VITALS — BP 106/70 | HR 81 | Temp 98.0°F | Ht 75.0 in | Wt 292.0 lb

## 2017-07-10 DIAGNOSIS — Z9189 Other specified personal risk factors, not elsewhere classified: Secondary | ICD-10-CM

## 2017-07-10 DIAGNOSIS — Z6836 Body mass index (BMI) 36.0-36.9, adult: Secondary | ICD-10-CM | POA: Diagnosis not present

## 2017-07-10 DIAGNOSIS — E7849 Other hyperlipidemia: Secondary | ICD-10-CM

## 2017-07-10 DIAGNOSIS — E119 Type 2 diabetes mellitus without complications: Secondary | ICD-10-CM | POA: Diagnosis not present

## 2017-07-10 DIAGNOSIS — I10 Essential (primary) hypertension: Secondary | ICD-10-CM | POA: Diagnosis not present

## 2017-07-10 DIAGNOSIS — E559 Vitamin D deficiency, unspecified: Secondary | ICD-10-CM

## 2017-07-10 MED ORDER — LIRAGLUTIDE 18 MG/3ML ~~LOC~~ SOPN: 1.2000 mg | PEN_INJECTOR | Freq: Every morning | SUBCUTANEOUS | 0 refills | Status: DC

## 2017-07-10 MED ORDER — INSULIN PEN NEEDLE 32G X 4 MM MISC: 1.0000 | Freq: Every morning | 0 refills | Status: DC

## 2017-07-10 MED ORDER — METFORMIN HCL 1000 MG PO TABS: ORAL_TABLET | ORAL | 0 refills | Status: DC

## 2017-07-10 MED ORDER — VITAMIN D (ERGOCALCIFEROL) 1.25 MG (50000 UNIT) PO CAPS: 50000.0000 [IU] | ORAL_CAPSULE | ORAL | 0 refills | Status: DC

## 2017-07-10 NOTE — Progress Notes (Signed)
Office: [phone]  /  Fax: [phone]   HPI:   Chief Complaint: OBESITY Joel Medina is here to discuss his progress with his obesity treatment plan. He is on the Category 3 plan and is following his eating plan approximately 80 % of the time. He states he is walking for 30 minutes 7 times per week. Joel Medina is retaining fluids. He states he has been following the structured meal plan.  His weight is 292 lb (132.5 kg) today and has gained 5 pounds since his last visit. He has lost 35 lbs since starting treatment with [place].  Hypertension Joel Medina is a 57 y.o. male with hypertension. Joel Medina's blood pressure is stable and he denies chest pain or shortness of breath. He is on Micardis and Maxzide and he is retaining fluids but denies swelling of his ankles. He is working weight loss to help control his blood pressure with the goal of decreasing his risk of heart attack and stroke. Joel Medina's blood pressure is currently controlled.  Hyperlipidemia Joel Medina has hyperlipidemia and has been trying to improve his cholesterol levels with intensive lifestyle modification including a low saturated fat diet, exercise and weight loss. Joel Medina's triglycerides >150, HDL not at goal, and he is not on any medication and LDL >70. He declines medications today. He denies any chest pain, claudication or myalgias.  At risk for cardiovascular disease Joel Medina is at a higher than average risk for cardiovascular disease due to obesity, hypertension, and hyperlipidemia. He currently denies any chest pain.  Vitamin D deficiency Joel Medina has a diagnosis of vitamin D deficiency. He is currently taking prescription Vit D and he denies nausea, vomiting or muscle weakness.  Diabetes II Joel Medina has a diagnosis of diabetes type II. Joel Medina states fasting BGs range between 130's and 140's. He denies any hypoglycemic episodes. Joel Medina is on Victoza and metformin. Last A1c was 5.2 on 04/03/17. He has been working on intensive lifestyle  modifications including diet, exercise, and weight loss to help control his blood glucose levels.  ALLERGIES: Allergies  Allergen Reactions  . Amlodipine     Edema with 10 mg/d    MEDICATIONS: Current Outpatient Medications on File Prior to Visit  Medication Sig Dispense Refill  . Cyanocobalamin (B-12) 2500 MCG TABS Take by mouth.    Marland Kitchen glucose blood test strip Use as instructed 100 each 0  . Lancets (ACCU-CHEK SAFE-T PRO) lancets Use as instructed 100 each 0  . telmisartan (MICARDIS) 80 MG tablet Take 1 tablet (80 mg total) by mouth daily. 90 tablet 3  . triamterene-hydrochlorothiazide (MAXZIDE-25) 37.5-25 MG tablet Take 1 tablet by mouth daily. (Patient taking differently: Take 1 tablet by mouth daily. Take 1/2 pill daily) 90 tablet 3   No current facility-administered medications on file prior to visit.     PAST MEDICAL HISTORY: Past Medical History:  Diagnosis Date  . Arthritis    OA BOTH HIPS - PAIN IN LEFT HIP WORSE  . Back pain   . Diabetes mellitus without complication (HCC)    oral meds  . History of pyelonephritis   . HTN (hypertension)   . Joint pain   . Obesity   . Prediabetes   . Swelling    feet and legs    PAST SURGICAL HISTORY: Past Surgical History:  Procedure Laterality Date  . [place]  . RIGHT ULNAR NERVE SURGERY TO REMOVE SCAR TISSUE  ? 2006  . TOTAL HIP ARTHROPLASTY Left 02/04/2013   Procedure: LEFT TOTAL HIP ARTHROPLASTY;  Surgeon: [name], MD;  Location: [facility];  Service: Orthopedics;  Laterality: Left;  . TOTAL HIP ARTHROPLASTY Right 11/24/2014   Procedure: RIGHT TOTAL HIP ARTHROPLASTY ANTERIOR APPROACH;  Surgeon: Gaynelle Arabian, MD;  Location: [facility];  Service: Orthopedics;  Laterality: Right;  . WISDOM TEETH EXTRACTIONS  ? 60  . WISDOM TOOTH EXTRACTION      SOCIAL HISTORY: Social History   Tobacco Use  . Smoking status: Never Smoker  . Smokeless tobacco: Never Used  Substance Use Topics  . Alcohol use: Yes     Alcohol/week: 7.2 oz    Types: 12 Cans of beer per week  . Drug use: No    FAMILY HISTORY: Family History  Problem Relation Age of Onset  . Hypertension Father   . Obesity Father   . Hypertension Mother   . Cancer Mother   . Depression Mother   . Obesity Mother   . Hypertension Other   . Colon cancer Neg Hx   . Colon polyps Neg Hx   . Esophageal cancer Neg Hx   . Rectal cancer Neg Hx   . Stomach cancer Neg Hx     ROS: Review of Systems  Constitutional: Negative for weight loss.  Respiratory: Negative for shortness of breath.   Cardiovascular: Negative for chest pain and claudication.       Negative ankle swelling  Gastrointestinal: Negative for nausea and vomiting.  Musculoskeletal: Negative for myalgias.       Negative muscle weakness  Endo/Heme/Allergies:       Negative hypoglycemia    PHYSICAL EXAM: Blood pressure 106/70, pulse 81, temperature 98 F (36.7 C), temperature source Oral, height 6\' 3"  (1.905 m), weight 292 lb (132.5 kg), SpO2 96 %. Body mass index is 36.5 kg/m. Physical Exam  Constitutional: He is oriented to person, place, and time. He appears well-developed and well-nourished.  Cardiovascular: Normal rate.  Pulmonary/Chest: Effort normal.  Musculoskeletal: Normal range of motion.  Neurological: He is oriented to person, place, and time.  Skin: Skin is warm and dry.  Psychiatric: He has a normal mood and affect. His behavior is normal.  Vitals reviewed.   RECENT LABS AND TESTS: BMET    Component Value Date/Time   NA 139 04/03/2017 0815   K 4.3 04/03/2017 0815   CL 101 04/03/2017 0815   CO2 23 04/03/2017 0815   GLUCOSE 112 (H) 04/03/2017 0815   GLUCOSE 197 (H) 10/01/2016 1027   BUN 16 04/03/2017 0815   CREATININE 0.95 04/03/2017 0815   CALCIUM 9.7 04/03/2017 0815   GFRNONAA 90 04/03/2017 0815   GFRAA 104 04/03/2017 0815   Lab Results  Component Value Date   HGBA1C 5.2 04/03/2017   HGBA1C 6.0 (H) 12/27/2016   HGBA1C 7.4 (H)  10/01/2016   HGBA1C 7.7 (H) 06/29/2016   HGBA1C 6.7 (H) 03/23/2016   Lab Results  Component Value Date   INSULIN 34.5 (H) 04/03/2017   INSULIN 53.0 (H) 12/27/2016   CBC    Component Value Date/Time   WBC 6.9 12/27/2016 1125   WBC 6.8 10/01/2016 1027   RBC 4.75 12/27/2016 1125   RBC 4.86 10/01/2016 1027   HGB 15.6 12/27/2016 1125   HCT 44.4 12/27/2016 1125   PLT 212 12/27/2016 1125   MCV 94 12/27/2016 1125   MCH 32.8 12/27/2016 1125   MCH 31.7 11/25/2014 0435   MCHC 35.1 12/27/2016 1125   MCHC 34.9 10/01/2016 1027   RDW 14.8 12/27/2016 1125   LYMPHSABS 2.3 12/27/2016 1125  MONOABS 0.4 10/01/2016 1027   EOSABS 0.1 12/27/2016 1125   BASOSABS 0.0 12/27/2016 1125   Iron/TIBC/Ferritin/ %Sat No results found for: IRON, TIBC, FERRITIN, IRONPCTSAT Lipid Panel     Component Value Date/Time   CHOL 145 04/03/2017 0815   TRIG 152 (H) 04/03/2017 0815   HDL 31 (L) 04/03/2017 0815   CHOLHDL 4 10/01/2016 1027   VLDL 33.8 10/01/2016 1027   LDLCALC 84 04/03/2017 0815   LDLDIRECT 93.3 04/02/2014 1632   Hepatic Function Panel     Component Value Date/Time   PROT 6.8 04/03/2017 0815   ALBUMIN 4.2 04/03/2017 0815   AST 17 04/03/2017 0815   ALT 17 04/03/2017 0815   ALKPHOS 72 04/03/2017 0815   BILITOT 0.8 04/03/2017 0815   BILIDIR 0.2 10/01/2016 1027      Component Value Date/Time   TSH 3.180 12/27/2016 1125   TSH 2.60 10/01/2016 1027   TSH 3.26 06/29/2016 1205  Results for Joel, Medina (MRN [id number]) as of 07/10/2017 09:32  Ref. Range 04/03/2017 08:15  Vitamin D, 25-Hydroxy Latest Ref Range: 30.0 - 100.0 ng/mL 35.1    ASSESSMENT AND PLAN: Essential hypertension  Vitamin D deficiency - Plan: VITAMIN D 25 Hydroxy (Vit-D Deficiency, Fractures), Vitamin D, Ergocalciferol, (DRISDOL) 50000 units CAPS capsule  Type 2 diabetes mellitus without complication, without long-term current use of insulin (HCC) - Plan: Comprehensive metabolic panel, Hemoglobin A1c, Insulin, random,  metFORMIN (GLUCOPHAGE) 1000 MG tablet, Insulin Pen Needle 32G X 4 MM MISC, liraglutide (VICTOZA) 18 MG/3ML SOPN, DISCONTINUED: liraglutide (VICTOZA) 18 MG/3ML SOPN  Other hyperlipidemia - Plan: Lipid Panel With LDL/HDL Ratio  At risk for heart disease  Class 2 severe obesity with serious comorbidity and body mass index (BMI) of 36.0 to 36.9 in adult, unspecified obesity type (HCC)  PLAN:  Hypertension We discussed sodium restriction, working on healthy weight loss, and a regular exercise program as the means to achieve improved blood pressure control. Ferry agreed with this plan and agreed to follow up as directed. We will continue to monitor his blood pressure as well as his progress with the above lifestyle modifications. He will continue his medications as prescribed and will watch for signs of hypotension as he continues his lifestyle modifications. Tamon agrees to follow up with our clinic in 2 weeks.  Hyperlipidemia Quinterious was informed of the American Heart Association Guidelines emphasizing intensive lifestyle modifications as the first line treatment for hyperlipidemia. We discussed many lifestyle modifications today in depth, and Vlad will continue to work on decreasing saturated fats such as fatty red meat, butter and many fried foods. He will also increase vegetables and lean protein in his diet and continue to work on diet, exercise, and weight loss efforts. We will check labs and Joel Medina agrees to follow up with our clinic in 2 weeks.  Cardiovascular risk counselling Romani was given extended (15 minutes) coronary artery disease prevention counseling today. He is 57 y.o. male and has risk factors for heart disease including obesity, hypertension, and hyperlipidemia. We discussed intensive lifestyle modifications today with an emphasis on specific weight loss instructions and strategies. Pt was also informed of the importance of increasing exercise and decreasing saturated fats to  help prevent heart disease.  Vitamin D Deficiency Joel Medina was informed that low vitamin D levels contributes to fatigue and are associated with obesity, breast, and colon cancer. Joel Medina agrees to continue taking prescription Vit D @50 ,000 IU every week #4 and we will refill for 1 month. He will follow up for  routine testing of vitamin D, at least 2-3 times per year. He was informed of the risk of over-replacement of vitamin D and agrees to not increase his dose unless he discusses this with [place] first. Joel Medina agrees to follow up with our clinic in 2 weeks.  Diabetes II Joel Medina has been given extensive diabetes education by myself today including ideal fasting and post-prandial blood glucose readings, individual ideal Hgb A1c goals and hypoglycemia prevention. We discussed the importance of good blood sugar control to decrease the likelihood of diabetic complications such as nephropathy, neuropathy, limb loss, blindness, coronary artery disease, and death. We discussed the importance of intensive lifestyle modification including diet, exercise and weight loss as the first line treatment for diabetes. Joel Medina agrees to continue taking metformin 1,000 mg BID #60 and we will refill for 1 month and he agrees to continue Victoza 3 pens (Pt. at 1.4 mg qd) and we will refill for 1 month and refill insulin needles for 1 month. Joel Medina agrees to follow up with our clinic in 2 weeks.  Obesity Joel Medina is currently in the action stage of change. As such, his goal is to continue with weight loss efforts He has agreed to follow the Category 3 plan Joel Medina has been instructed to work up to a goal of 150 minutes of combined cardio and strengthening exercise per week for weight loss and overall health benefits. We discussed the following Behavioral Modification Strategies today: increasing lean protein intake and work on meal planning and easy cooking plans   Joel Medina has agreed to follow up with our clinic in 2 weeks. He was  informed of the importance of frequent follow up visits to maximize his success with intensive lifestyle modifications for his multiple health conditions.   OBESITY BEHAVIORAL INTERVENTION VISIT  Today's visit was # 13 out of 22.  Starting weight: 327 lbs Starting date: 12/27/16 Today's weight : 292 lbs  Today's date: 07/10/2017 Total lbs lost to date: 31 (Patients must lose 7 lbs in the first 6 months to continue with counseling)   ASK: We discussed the diagnosis of obesity with Joel Medina today and Sivan agreed to give [place] permission to discuss obesity behavioral modification therapy today.  ASSESS: Nathanuel has the diagnosis of obesity and his BMI today is 45.5 Tymir is in the action stage of change   ADVISE: Rhea was educated on the multiple health risks of obesity as well as the benefit of weight loss to improve his health. He was advised of the need for long term treatment and the importance of lifestyle modifications.  AGREE: Multiple dietary modification options and treatment options were discussed and  Gorje agreed to the above obesity treatment plan.   Wilhemena Durie, am acting as transcriptionist for Joel Duverney, PA-C I, Joel Medina,  have reviewed this note and agree with its content

## 2017-07-11 LAB — INSULIN, RANDOM: INSULIN: 84.2 u[IU]/mL — AB (ref 2.6–24.9)

## 2017-07-11 LAB — COMPREHENSIVE METABOLIC PANEL
ALK PHOS: 71 IU/L (ref 39–117)
ALT: 21 IU/L (ref 0–44)
AST: 18 IU/L (ref 0–40)
Albumin/Globulin Ratio: 1.6 (ref 1.2–2.2)
Albumin: 4.2 g/dL (ref 3.5–5.5)
BILIRUBIN TOTAL: 0.7 mg/dL (ref 0.0–1.2)
BUN/Creatinine Ratio: 21 — ABNORMAL HIGH (ref 9–20)
BUN: 21 mg/dL (ref 6–24)
CHLORIDE: 101 mmol/L (ref 96–106)
CO2: 21 mmol/L (ref 20–29)
Calcium: 9.5 mg/dL (ref 8.7–10.2)
Creatinine, Ser: 1.01 mg/dL (ref 0.76–1.27)
GFR calc non Af Amer: 83 mL/min/{1.73_m2} (ref >59)
GFR, EST AFRICAN AMERICAN: 96 mL/min/{1.73_m2} (ref >59)
GLUCOSE: 119 mg/dL — AB (ref 65–99)
Globulin, Total: 2.6 g/dL (ref 1.5–4.5)
Potassium: 4.3 mmol/L (ref 3.5–5.2)
Sodium: 137 mmol/L (ref 134–144)
TOTAL PROTEIN: 6.8 g/dL (ref 6.0–8.5)

## 2017-07-11 LAB — HEMOGLOBIN A1C
ESTIMATED AVERAGE GLUCOSE: 117 mg/dL
HEMOGLOBIN A1C: 5.7 % — AB (ref 4.8–5.6)

## 2017-07-11 LAB — VITAMIN D 25 HYDROXY (VIT D DEFICIENCY, FRACTURES): Vit D, 25-Hydroxy: 25.6 ng/mL — ABNORMAL LOW (ref 30.0–100.0)

## 2017-07-11 LAB — LIPID PANEL WITH LDL/HDL RATIO
CHOLESTEROL TOTAL: 158 mg/dL (ref 100–199)
HDL: 34 mg/dL — ABNORMAL LOW (ref >39)
LDL Calculated: 75 mg/dL (ref 0–99)
LDl/HDL Ratio: 2.2 ratio (ref 0.0–3.6)
Triglycerides: 244 mg/dL — ABNORMAL HIGH (ref 0–149)
VLDL Cholesterol Cal: 49 mg/dL — ABNORMAL HIGH (ref 5–40)

## 2017-07-23 ENCOUNTER — Ambulatory Visit (INDEPENDENT_AMBULATORY_CARE_PROVIDER_SITE_OTHER): Payer: 59 | Admitting: Physician Assistant

## 2017-07-23 VITALS — BP 114/77 | HR 68 | Temp 97.6°F | Ht 75.0 in | Wt 295.0 lb

## 2017-07-23 DIAGNOSIS — Z6836 Body mass index (BMI) 36.0-36.9, adult: Secondary | ICD-10-CM | POA: Diagnosis not present

## 2017-07-23 DIAGNOSIS — I10 Essential (primary) hypertension: Secondary | ICD-10-CM

## 2017-07-23 NOTE — Progress Notes (Signed)
Office: [phone]  /  Fax: [phone]   HPI:   Chief Complaint: OBESITY Joel Medina is here to discuss his progress with his obesity treatment plan. He is on the Category 3 plan and is following his eating plan approximately 50 % of the time. He states he is walking 20 to 50 minutes 5 to 7 times per week. Joel Medina is not following the meal plan and struggles to meet his required protein. Joel Medina is advised to not skip meals, as that would hurt his metabolism. His weight is 295 lb (133.8 kg) today and has had a weight gain of 3 pounds over a period of 2 weeks since his last visit. He has lost 32 lbs since starting treatment with [place].  Hypertension Joel Medina is a 57 y.o. male with hypertension. He is currently taking Micardis and Maxzide. Joel Medina denies chest pain or shortness of breath on exertion. He is working weight loss to help control his blood pressure with the goal of decreasing his risk of heart attack and stroke. Joel Medina blood pressure is stable.  ALLERGIES: Allergies  Allergen Reactions  . Amlodipine     Edema with 10 mg/d    MEDICATIONS: Current Outpatient Medications on File Prior to Visit  Medication Sig Dispense Refill  . Cyanocobalamin (B-12) 2500 MCG TABS Take by mouth.    Joel Medina Kitchen glucose blood test strip Use as instructed 100 each 0  . Insulin Pen Needle 32G X 4 MM MISC Inject 1 Package into the skin every morning. 100 each 0  . Lancets (ACCU-CHEK SAFE-T PRO) lancets Use as instructed 100 each 0  . liraglutide (VICTOZA) 18 MG/3ML SOPN Inject 0.2 mLs (1.2 mg total) into the skin every morning. 3 pen 0  . metFORMIN (GLUCOPHAGE) 1000 MG tablet TAKE 1 TABLET (1,000 MG TOTAL) BY MOUTH 2 (TWO) TIMES DAILY WITH A MEAL. 60 tablet 0  . telmisartan (MICARDIS) 80 MG tablet Take 1 tablet (80 mg total) by mouth daily. 90 tablet 3  . triamterene-hydrochlorothiazide (MAXZIDE-25) 37.5-25 MG tablet Take 1 tablet by mouth daily. (Patient taking differently: Take 1 tablet by mouth  daily. Take 1/2 pill daily) 90 tablet 3  . Vitamin D, Ergocalciferol, (DRISDOL) 50000 units CAPS capsule Take 1 capsule (50,000 Units total) by mouth every 7 (seven) days. 4 capsule 0   No current facility-administered medications on file prior to visit.     PAST MEDICAL HISTORY: Past Medical History:  Diagnosis Date  . Arthritis    OA BOTH HIPS - PAIN IN LEFT HIP WORSE  . Back pain   . Diabetes mellitus without complication (HCC)    oral meds  . History of pyelonephritis   . HTN (hypertension)   . Joint pain   . Obesity   . Prediabetes   . Swelling    feet and legs    PAST SURGICAL HISTORY: Past Surgical History:  Procedure Laterality Date  . [place]  . RIGHT ULNAR NERVE SURGERY TO REMOVE SCAR TISSUE  ? 2006  . TOTAL HIP ARTHROPLASTY Left 02/04/2013   Procedure: LEFT TOTAL HIP ARTHROPLASTY;  Surgeon: [name], MD;  Location: [facility];  Service: Orthopedics;  Laterality: Left;  . TOTAL HIP ARTHROPLASTY Right 11/24/2014   Procedure: RIGHT TOTAL HIP ARTHROPLASTY ANTERIOR APPROACH;  Surgeon: Gaynelle Arabian, MD;  Location: [facility];  Service: Orthopedics;  Laterality: Right;  . WISDOM TEETH EXTRACTIONS  ? 14  . WISDOM TOOTH EXTRACTION      SOCIAL HISTORY:  Social History   Tobacco Use  . Smoking status: Never Smoker  . Smokeless tobacco: Never Used  Substance Use Topics  . Alcohol use: Yes    Alcohol/week: 7.2 oz    Types: 12 Cans of beer per week  . Drug use: No    FAMILY HISTORY: Family History  Problem Relation Age of Onset  . Hypertension Father   . Obesity Father   . Hypertension Mother   . Cancer Mother   . Depression Mother   . Obesity Mother   . Hypertension Other   . Colon cancer Neg Hx   . Colon polyps Neg Hx   . Esophageal cancer Neg Hx   . Rectal cancer Neg Hx   . Stomach cancer Neg Hx     ROS: Review of Systems  Constitutional: Negative for weight loss.  Respiratory: Negative for shortness of breath (on exertion).     Cardiovascular: Negative for chest pain.    PHYSICAL EXAM: Blood pressure 114/77, pulse 68, temperature 97.6 F (36.4 C), temperature source Oral, height 6\' 3"  (1.905 m), weight 295 lb (133.8 kg), SpO2 96 %. Body mass index is 36.87 kg/m. Physical Exam  Constitutional: He is oriented to person, place, and time. He appears well-developed and well-nourished.  Cardiovascular: Normal rate.  Pulmonary/Chest: Effort normal.  Musculoskeletal: Normal range of motion.  Neurological: He is oriented to person, place, and time.  Skin: Skin is warm and dry.  Psychiatric: He has a normal mood and affect. His behavior is normal.  Vitals reviewed.   RECENT LABS AND TESTS: BMET    Component Value Date/Time   NA 137 07/10/2017 0901   K 4.3 07/10/2017 0901   CL 101 07/10/2017 0901   CO2 21 07/10/2017 0901   GLUCOSE 119 (H) 07/10/2017 0901   GLUCOSE 197 (H) 10/01/2016 1027   BUN 21 07/10/2017 0901   CREATININE 1.01 07/10/2017 0901   CALCIUM 9.5 07/10/2017 0901   GFRNONAA 83 07/10/2017 0901   GFRAA 96 07/10/2017 0901   Lab Results  Component Value Date   HGBA1C 5.7 (H) 07/10/2017   HGBA1C 5.2 04/03/2017   HGBA1C 6.0 (H) 12/27/2016   HGBA1C 7.4 (H) 10/01/2016   HGBA1C 7.7 (H) 06/29/2016   Lab Results  Component Value Date   INSULIN 84.2 (H) 07/10/2017   INSULIN 34.5 (H) 04/03/2017   INSULIN 53.0 (H) 12/27/2016   CBC    Component Value Date/Time   WBC 6.9 12/27/2016 1125   WBC 6.8 10/01/2016 1027   RBC 4.75 12/27/2016 1125   RBC 4.86 10/01/2016 1027   HGB 15.6 12/27/2016 1125   HCT 44.4 12/27/2016 1125   PLT 212 12/27/2016 1125   MCV 94 12/27/2016 1125   MCH 32.8 12/27/2016 1125   MCH 31.7 11/25/2014 0435   MCHC 35.1 12/27/2016 1125   MCHC 34.9 10/01/2016 1027   RDW 14.8 12/27/2016 1125   LYMPHSABS 2.3 12/27/2016 1125   MONOABS 0.4 10/01/2016 1027   EOSABS 0.1 12/27/2016 1125   BASOSABS 0.0 12/27/2016 1125   Iron/TIBC/Ferritin/ %Sat No results found for: IRON, TIBC,  FERRITIN, IRONPCTSAT Lipid Panel     Component Value Date/Time   CHOL 158 07/10/2017 0901   TRIG 244 (H) 07/10/2017 0901   HDL 34 (L) 07/10/2017 0901   CHOLHDL 4 10/01/2016 1027   VLDL 33.8 10/01/2016 1027   LDLCALC 75 07/10/2017 0901   LDLDIRECT 93.3 04/02/2014 1632   Hepatic Function Panel     Component Value Date/Time   PROT 6.8  07/10/2017 0901   ALBUMIN 4.2 07/10/2017 0901   AST 18 07/10/2017 0901   ALT 21 07/10/2017 0901   ALKPHOS 71 07/10/2017 0901   BILITOT 0.7 07/10/2017 0901   BILIDIR 0.2 10/01/2016 1027      Component Value Date/Time   TSH 3.180 12/27/2016 1125   TSH 2.60 10/01/2016 1027   TSH 3.26 06/29/2016 1205    ASSESSMENT AND PLAN: Essential hypertension  Class 2 severe obesity with serious comorbidity and body mass index (BMI) of 36.0 to 36.9 in adult, unspecified obesity type (HCC)  PLAN:  Hypertension We discussed sodium restriction, working on healthy weight loss, and a regular exercise program as the means to achieve improved blood pressure control. Joel Medina agreed with this plan and agreed to follow up as directed. We will continue to monitor his blood pressure as well as his progress with the above lifestyle modifications. He will continue his medications as prescribed and will watch for signs of hypotension as he continues his lifestyle modifications.  We spent > than 50% of the 15 minute visit on the counseling as documented in the note.  Obesity Joel Medina is currently in the action stage of change. As such, his goal is to continue with weight loss efforts He has agreed to follow the Category 3 plan Joel Medina has been instructed to work up to a goal of 150 minutes of combined cardio and strengthening exercise per week for weight loss and overall health benefits. We discussed the following Behavioral Modification Strategies today: increasing lean protein intake and work on meal planning and easy cooking plans  Joel Medina has agreed to follow up with our  clinic in 2 weeks. He was informed of the importance of frequent follow up visits to maximize his success with intensive lifestyle modifications for his multiple health conditions.   OBESITY BEHAVIORAL INTERVENTION VISIT  Today's visit was # 14 out of 22.  Starting weight: 327 lbs Starting date: 12/27/16 Today's weight : 295 lbs  Today's date: 07/23/2017 Total lbs lost to date: 57 (Patients must lose 7 lbs in the first 6 months to continue with counseling)   ASK: We discussed the diagnosis of obesity with Joel Medina today and Joel Medina agreed to give [place] permission to discuss obesity behavioral modification therapy today.  ASSESS: Joel Medina has the diagnosis of obesity and his BMI today is 36.87 Joel Medina is in the action stage of change   ADVISE: Joel Medina was educated on the multiple health risks of obesity as well as the benefit of weight loss to improve his health. He was advised of the need for long term treatment and the importance of lifestyle modifications.  AGREE: Multiple dietary modification options and treatment options were discussed and  Joel Medina agreed to the above obesity treatment plan.   Joel Medina, am acting as transcriptionist for [employer], PA-C I, Joel Medina [facility], have reviewed this note and agree with its content.

## 2017-08-02 ENCOUNTER — Ambulatory Visit: Payer: 59 | Admitting: Internal Medicine

## 2017-08-05 ENCOUNTER — Ambulatory Visit (INDEPENDENT_AMBULATORY_CARE_PROVIDER_SITE_OTHER): Payer: 59 | Admitting: Physician Assistant

## 2017-08-05 ENCOUNTER — Encounter (INDEPENDENT_AMBULATORY_CARE_PROVIDER_SITE_OTHER): Payer: Self-pay | Admitting: Physician Assistant

## 2017-08-05 VITALS — BP 122/79 | HR 74 | Temp 97.9°F | Ht 75.0 in | Wt 295.0 lb

## 2017-08-05 DIAGNOSIS — Z6836 Body mass index (BMI) 36.0-36.9, adult: Secondary | ICD-10-CM | POA: Diagnosis not present

## 2017-08-05 DIAGNOSIS — E559 Vitamin D deficiency, unspecified: Secondary | ICD-10-CM

## 2017-08-05 DIAGNOSIS — Z9189 Other specified personal risk factors, not elsewhere classified: Secondary | ICD-10-CM | POA: Diagnosis not present

## 2017-08-05 DIAGNOSIS — E119 Type 2 diabetes mellitus without complications: Secondary | ICD-10-CM

## 2017-08-05 MED ORDER — LIRAGLUTIDE 18 MG/3ML ~~LOC~~ SOPN: 1.2000 mg | PEN_INJECTOR | Freq: Every morning | SUBCUTANEOUS | 0 refills | Status: DC

## 2017-08-05 MED ORDER — VITAMIN D (ERGOCALCIFEROL) 1.25 MG (50000 UNIT) PO CAPS: 50000.0000 [IU] | ORAL_CAPSULE | ORAL | 0 refills | Status: DC

## 2017-08-05 NOTE — Progress Notes (Signed)
Office: [phone]  /  Fax: [phone]   HPI:   Chief Complaint: OBESITY Joel Medina is here to discuss his progress with his obesity treatment plan. He is on the Category 3 plan and is following his eating plan approximately 80 % of the time. He states he is walking for 40 minutes 7 times per week. Joel Medina maintained his weight. He states he is not getting all the protein in the meal plan, as he tries to lower his calorie intake.  His weight is 295 lb (133.8 kg) today and has not lost weight since his last visit. He has lost 32 lbs since starting treatment with [place].  Diabetes II Joel Medina has a diagnosis of diabetes type II. Joel Medina states he is not checking BGs at home and he denies any hypoglycemic episodes. Last A1c was 5.7 on 07/10/17. He has been working on intensive lifestyle modifications including diet, exercise, and weight loss to help control his blood glucose levels.  Vitamin D Deficiency Joel Medina has a diagnosis of vitamin D deficiency. He is currently taking prescription Vit D and denies nausea, vomiting or muscle weakness.  At risk for osteopenia and osteoporosis Joel Medina is at higher risk of osteopenia and osteoporosis due to vitamin D deficiency.   ALLERGIES: Allergies  Allergen Reactions  . Amlodipine     Edema with 10 mg/d    MEDICATIONS: Current Outpatient Medications on File Prior to Visit  Medication Sig Dispense Refill  . Cyanocobalamin (B-12) 2500 MCG TABS Take by mouth.    Marland Kitchen glucose blood test strip Use as instructed 100 each 0  . Insulin Pen Needle 32G X 4 MM MISC Inject 1 Package into the skin every morning. 100 each 0  . Lancets (ACCU-CHEK SAFE-T PRO) lancets Use as instructed 100 each 0  . metFORMIN (GLUCOPHAGE) 1000 MG tablet TAKE 1 TABLET (1,000 MG TOTAL) BY MOUTH 2 (TWO) TIMES DAILY WITH A MEAL. 60 tablet 0  . telmisartan (MICARDIS) 80 MG tablet Take 1 tablet (80 mg total) by mouth daily. 90 tablet 3  . triamterene-hydrochlorothiazide (MAXZIDE-25) 37.5-25 MG  tablet Take 1 tablet by mouth daily. (Patient taking differently: Take 1 tablet by mouth daily. Take 1/2 pill daily) 90 tablet 3   No current facility-administered medications on file prior to visit.     PAST MEDICAL HISTORY: Past Medical History:  Diagnosis Date  . Arthritis    OA BOTH HIPS - PAIN IN LEFT HIP WORSE  . Back pain   . Diabetes mellitus without complication (HCC)    oral meds  . History of pyelonephritis   . HTN (hypertension)   . Joint pain   . Obesity   . Prediabetes   . Swelling    feet and legs    PAST SURGICAL HISTORY: Past Surgical History:  Procedure Laterality Date  . [place]  . RIGHT ULNAR NERVE SURGERY TO REMOVE SCAR TISSUE  ? 2006  . TOTAL HIP ARTHROPLASTY Left 02/04/2013   Procedure: LEFT TOTAL HIP ARTHROPLASTY;  Surgeon: [name], MD;  Location: [facility];  Service: Orthopedics;  Laterality: Left;  . TOTAL HIP ARTHROPLASTY Right 11/24/2014   Procedure: RIGHT TOTAL HIP ARTHROPLASTY ANTERIOR APPROACH;  Surgeon: Gaynelle Arabian, MD;  Location: [facility];  Service: Orthopedics;  Laterality: Right;  . WISDOM TEETH EXTRACTIONS  ? 64  . WISDOM TOOTH EXTRACTION      SOCIAL HISTORY: Social History   Tobacco Use  . Smoking status: Never Smoker  . Smokeless tobacco: Never Used  Substance Use Topics  . Alcohol use: Yes    Alcohol/week: 7.2 oz    Types: 12 Cans of beer per week  . Drug use: No    FAMILY HISTORY: Family History  Problem Relation Age of Onset  . Hypertension Father   . Obesity Father   . Hypertension Mother   . Cancer Mother   . Depression Mother   . Obesity Mother   . Hypertension Other   . Colon cancer Neg Hx   . Colon polyps Neg Hx   . Esophageal cancer Neg Hx   . Rectal cancer Neg Hx   . Stomach cancer Neg Hx     ROS: Review of Systems  Constitutional: Negative for weight loss.  Gastrointestinal: Negative for nausea and vomiting.  Musculoskeletal:       Negative muscle weakness    Endo/Heme/Allergies:       Negative hypoglycemia    PHYSICAL EXAM: Blood pressure 122/79, pulse 74, temperature 97.9 F (36.6 C), temperature source Oral, height 6\' 3"  (1.905 m), weight 295 lb (133.8 kg), SpO2 97 %. Body mass index is 36.87 kg/m. Physical Exam  Constitutional: He is oriented to person, place, and time. He appears well-developed and well-nourished.  Cardiovascular: Normal rate.  Pulmonary/Chest: Effort normal.  Musculoskeletal: Normal range of motion.  Neurological: He is oriented to person, place, and time.  Skin: Skin is warm and dry.  Psychiatric: He has a normal mood and affect. His behavior is normal.  Vitals reviewed.   RECENT LABS AND TESTS: BMET    Component Value Date/Time   NA 137 07/10/2017 0901   K 4.3 07/10/2017 0901   CL 101 07/10/2017 0901   CO2 21 07/10/2017 0901   GLUCOSE 119 (H) 07/10/2017 0901   GLUCOSE 197 (H) 10/01/2016 1027   BUN 21 07/10/2017 0901   CREATININE 1.01 07/10/2017 0901   CALCIUM 9.5 07/10/2017 0901   GFRNONAA 83 07/10/2017 0901   GFRAA 96 07/10/2017 0901   Lab Results  Component Value Date   HGBA1C 5.7 (H) 07/10/2017   HGBA1C 5.2 04/03/2017   HGBA1C 6.0 (H) 12/27/2016   HGBA1C 7.4 (H) 10/01/2016   HGBA1C 7.7 (H) 06/29/2016   Lab Results  Component Value Date   INSULIN 84.2 (H) 07/10/2017   INSULIN 34.5 (H) 04/03/2017   INSULIN 53.0 (H) 12/27/2016   CBC    Component Value Date/Time   WBC 6.9 12/27/2016 1125   WBC 6.8 10/01/2016 1027   RBC 4.75 12/27/2016 1125   RBC 4.86 10/01/2016 1027   HGB 15.6 12/27/2016 1125   HCT 44.4 12/27/2016 1125   PLT 212 12/27/2016 1125   MCV 94 12/27/2016 1125   MCH 32.8 12/27/2016 1125   MCH 31.7 11/25/2014 0435   MCHC 35.1 12/27/2016 1125   MCHC 34.9 10/01/2016 1027   RDW 14.8 12/27/2016 1125   LYMPHSABS 2.3 12/27/2016 1125   MONOABS 0.4 10/01/2016 1027   EOSABS 0.1 12/27/2016 1125   BASOSABS 0.0 12/27/2016 1125   Iron/TIBC/Ferritin/ %Sat No results found for:  IRON, TIBC, FERRITIN, IRONPCTSAT Lipid Panel     Component Value Date/Time   CHOL 158 07/10/2017 0901   TRIG 244 (H) 07/10/2017 0901   HDL 34 (L) 07/10/2017 0901   CHOLHDL 4 10/01/2016 1027   VLDL 33.8 10/01/2016 1027   LDLCALC 75 07/10/2017 0901   LDLDIRECT 93.3 04/02/2014 1632   Hepatic Function Panel     Component Value Date/Time   PROT 6.8 07/10/2017 0901   ALBUMIN 4.2 07/10/2017  0901   AST 18 07/10/2017 0901   ALT 21 07/10/2017 0901   ALKPHOS 71 07/10/2017 0901   BILITOT 0.7 07/10/2017 0901   BILIDIR 0.2 10/01/2016 1027      Component Value Date/Time   TSH 3.180 12/27/2016 1125   TSH 2.60 10/01/2016 1027   TSH 3.26 06/29/2016 1205  Results for OLUWATIMILEHIN, BALFOUR (MRN [id number]) as of 08/05/2017 13:09  Ref. Range 07/10/2017 09:01  Vitamin D, 25-Hydroxy Latest Ref Range: 30.0 - 100.0 ng/mL 25.6 (L)    ASSESSMENT AND PLAN: Type 2 diabetes mellitus without complication, without long-term current use of insulin (HCC) - Plan: liraglutide (VICTOZA) 18 MG/3ML SOPN  Vitamin D deficiency - Plan: Vitamin D, Ergocalciferol, (DRISDOL) 50000 units CAPS capsule  At risk for osteoporosis  Class 2 severe obesity with serious comorbidity and body mass index (BMI) of 36.0 to 36.9 in adult, unspecified obesity type ([place])  PLAN:  Diabetes II Joel Medina has been given extensive diabetes education by myself today including ideal fasting and post-prandial blood glucose readings, individual ideal Hgb A1c goals and hypoglycemia prevention. We discussed the importance of good blood sugar control to decrease the likelihood of diabetic complications such as nephropathy, neuropathy, limb loss, blindness, coronary artery disease, and death. We discussed the importance of intensive lifestyle modification including diet, exercise and weight loss as the first line treatment for diabetes. Joel Medina agrees to continue Victoza 5 pens (Pt at 1.8 mg qd) and we will refill for 1 month. Joel Medina agrees to follow up with  our clinic in 2 weeks.  Vitamin D Deficiency Joel Medina was informed that low vitamin D levels contributes to fatigue and are associated with obesity, breast, and colon cancer. Joel Medina agrees to continue taking prescription Vit D @50 ,000 IU every week #4 and we will refill for 1 month. He will follow up for routine testing of vitamin D, at least 2-3 times per year. He was informed of the risk of over-replacement of vitamin D and agrees to not increase his dose unless he discusses this with [place] first. Joel Medina agrees to follow up with our clinic in 2 weeks.  At risk for osteopenia and osteoporosis Joel Medina is at risk for osteopenia and osteoporsis due to his vitamin D deficiency. He was encouraged to take his vitamin D and follow his higher calcium diet and increase strengthening exercise to help strengthen his bones and decrease his risk of osteopenia and osteoporosis.  Obesity Joel Medina is currently in the action stage of change. As such, his goal is to continue with weight loss efforts He has agreed to change to keep a food journal with 1400-1500 calories and 90+ grams of protein daily Joel Medina has been instructed to work up to a goal of 150 minutes of combined cardio and strengthening exercise per week for weight loss and overall health benefits. We discussed the following Behavioral Modification Strategies today: increasing lean protein intake, no skipping meals, and keep a strict food journal   Michae has agreed to follow up with our clinic in 2 weeks. He was informed of the importance of frequent follow up visits to maximize his success with intensive lifestyle modifications for his multiple health conditions.   OBESITY BEHAVIORAL INTERVENTION VISIT  Today's visit was # 15 out of 22.  Starting weight: 327 lbs Starting date: 12/27/16 Today's weight : 295 lbs  Today's date: 08/05/2017 Total lbs lost to date: 32 (Patients must lose 7 lbs in the first 6 months to continue with counseling)   ASK: We  discussed  the diagnosis of obesity with Joel Medina today and Joel Medina agreed to give [place] permission to discuss obesity behavioral modification therapy today.  ASSESS: Doran has the diagnosis of obesity and his BMI today is 36.87 Chandan is in the action stage of change   ADVISE: Azai was educated on the multiple health risks of obesity as well as the benefit of weight loss to improve his health. He was advised of the need for long term treatment and the importance of lifestyle modifications.  AGREE: Multiple dietary modification options and treatment options were discussed and  Cylus agreed to the above obesity treatment plan.   Wilhemena Durie, am acting as transcriptionist for Lacy Duverney, PA-C I, Lacy Duverney [facility], have reviewed this note and agree with its content.

## 2017-08-14 ENCOUNTER — Ambulatory Visit: Payer: 59 | Admitting: Internal Medicine

## 2017-08-14 ENCOUNTER — Encounter: Payer: Self-pay | Admitting: Internal Medicine

## 2017-08-14 DIAGNOSIS — E119 Type 2 diabetes mellitus without complications: Secondary | ICD-10-CM

## 2017-08-14 DIAGNOSIS — I10 Essential (primary) hypertension: Secondary | ICD-10-CM

## 2017-08-14 MED ORDER — TELMISARTAN 80 MG PO TABS: 80.0000 mg | ORAL_TABLET | Freq: Every day | ORAL | 3 refills | Status: DC

## 2017-08-14 MED ORDER — TRIAMTERENE-HCTZ 37.5-25 MG PO TABS: 1.0000 | ORAL_TABLET | Freq: Every day | ORAL | 3 refills | Status: DC

## 2017-08-14 MED ORDER — METFORMIN HCL 1000 MG PO TABS: ORAL_TABLET | ORAL | 3 refills | Status: DC

## 2017-08-14 NOTE — Progress Notes (Signed)
Subjective:  Patient ID: Joel Medina, male    DOB: [birth year]  Age: 57 y.o. MRN: [id number]  CC: No chief complaint on file.   HPI Joel Medina presents for HTN, DM, obesity. C/o wt gain C/o OA sx's  Outpatient Medications Prior to Visit  Medication Sig Dispense Refill  . glucose blood test strip Use as instructed 100 each 0  . Insulin Pen Needle 32G X 4 MM MISC Inject 1 Package into the skin every morning. 100 each 0  . Lancets (ACCU-CHEK SAFE-T PRO) lancets Use as instructed 100 each 0  . liraglutide (VICTOZA) 18 MG/3ML SOPN Inject 0.2 mLs (1.2 mg total) into the skin every morning. 5 pen 0  . metFORMIN (GLUCOPHAGE) 1000 MG tablet TAKE 1 TABLET (1,000 MG TOTAL) BY MOUTH 2 (TWO) TIMES DAILY WITH A MEAL. 60 tablet 0  . telmisartan (MICARDIS) 80 MG tablet Take 1 tablet (80 mg total) by mouth daily. 90 tablet 3  . triamterene-hydrochlorothiazide (MAXZIDE-25) 37.5-25 MG tablet Take 1 tablet by mouth daily. (Patient taking differently: Take 1 tablet by mouth daily. Take 1/2 pill daily) 90 tablet 3  . Vitamin D, Ergocalciferol, (DRISDOL) 50000 units CAPS capsule Take 1 capsule (50,000 Units total) by mouth every 7 (seven) days. 4 capsule 0  . Cyanocobalamin (B-12) 2500 MCG TABS Take by mouth.     No facility-administered medications prior to visit.     ROS Review of Systems  Constitutional: Positive for unexpected weight change. Negative for appetite change and fatigue.  HENT: Negative for congestion, nosebleeds, sneezing, sore throat and trouble swallowing.   Eyes: Negative for itching and visual disturbance.  Respiratory: Negative for cough.   Cardiovascular: Negative for chest pain, palpitations and leg swelling.  Gastrointestinal: Negative for abdominal distention, blood in stool, diarrhea and nausea.  Genitourinary: Negative for frequency and hematuria.  Musculoskeletal: Positive for arthralgias. Negative for back pain, gait problem, joint swelling and neck pain.  Skin:  Negative for rash.  Neurological: Negative for dizziness, tremors, speech difficulty and weakness.  Psychiatric/Behavioral: Negative for agitation, dysphoric mood and sleep disturbance. The patient is not nervous/anxious.     Objective:  BP 118/78 (BP Location: Left Arm, Patient Position: Sitting, Cuff Size: Large)   Pulse 71   Temp 98 F (36.7 C) (Oral)   Ht 6\' 3"  (1.905 m)   Wt (!) 303 lb (137.4 kg)   SpO2 99%   BMI 37.87 kg/m   BP Readings from Last 3 Encounters:  08/14/17 118/78  08/05/17 122/79  07/23/17 114/77    Wt Readings from Last 3 Encounters:  08/14/17 (!) 303 lb (137.4 kg)  08/05/17 295 lb (133.8 kg)  07/23/17 295 lb (133.8 kg)    Physical Exam  Constitutional: He is oriented to person, place, and time. He appears well-developed. No distress.  NAD  HENT:  Mouth/Throat: Oropharynx is clear and moist.  Eyes: Conjunctivae are normal. Pupils are equal, round, and reactive to light.  Neck: Normal range of motion. No JVD present. No thyromegaly present.  Cardiovascular: Normal rate, regular rhythm, normal heart sounds and intact distal pulses. Exam reveals no gallop and no friction rub.  No murmur heard. Pulmonary/Chest: Effort normal and breath sounds normal. No respiratory distress. He has no wheezes. He has no rales. He exhibits no tenderness.  Abdominal: Soft. Bowel sounds are normal. He exhibits no distension and no mass. There is no tenderness. There is no rebound and no guarding.  Musculoskeletal: Normal range of motion. He exhibits tenderness.  He exhibits no edema.  Lymphadenopathy:    He has no cervical adenopathy.  Neurological: He is alert and oriented to person, place, and time. He has normal reflexes. No cranial nerve deficit. He exhibits normal muscle tone. He displays a negative Romberg sign. Coordination and gait normal.  Skin: Skin is warm and dry. No rash noted.  Psychiatric: He has a normal mood and affect. His behavior is normal. Judgment and  thought content normal.    Lab Results  Component Value Date   WBC 6.9 12/27/2016   HGB 15.6 12/27/2016   HCT 44.4 12/27/2016   PLT 212 12/27/2016   GLUCOSE 119 (H) 07/10/2017   CHOL 158 07/10/2017   TRIG 244 (H) 07/10/2017   HDL 34 (L) 07/10/2017   LDLDIRECT 93.3 04/02/2014   LDLCALC 75 07/10/2017   ALT 21 07/10/2017   AST 18 07/10/2017   NA 137 07/10/2017   K 4.3 07/10/2017   CL 101 07/10/2017   CREATININE 1.01 07/10/2017   BUN 21 07/10/2017   CO2 21 07/10/2017   TSH 3.180 12/27/2016   PSA 2.20 10/01/2016   INR 1.08 11/19/2014   HGBA1C 5.7 (H) 07/10/2017   MICROALBUR 1.0 10/01/2016    Mr Brain W Wo Contrast  Result Date: 07/16/2016 CLINICAL DATA:  57 y/o M; mild headache and slurring of speech since fall 04/2016 and injury to right-sided head. EXAM: MRI HEAD WITHOUT AND WITH CONTRAST TECHNIQUE: Multiplanar, multiecho pulse sequences of the brain and surrounding structures were obtained without and with intravenous contrast. CONTRAST:  73mL MULTIHANCE GADOBENATE DIMEGLUMINE 529 MG/ML IV SOLN COMPARISON:  None. FINDINGS: Brain: No acute infarction, hemorrhage, hydrocephalus, extra-axial collection or mass lesion. Incidental prominent perivascular spaces in the left lateral frontal and right parietal white matter. No abnormal susceptibility hypointensity to suggest hemosiderin deposition. Single punctate focus of T2 FLAIR hyperintensity within the right frontal subcortical white matter is of unlikely clinical significance. No abnormal enhancement. Vascular: Normal flow voids. Skull and upper cervical spine: Normal marrow signal. Sinuses/Orbits: Trace right mastoid effusion. Mucosal thickening within the sphenoid sinus, maxillary sinuses, and anterior ethmoid air cells. Orbits are unremarkable. Other: None. IMPRESSION: No evidence for cortical contusion or diffuse axonal injury. Unremarkable MRI of the head with and without contrast for age. Electronically Signed   By: Kristine Garbe M.D.   On: 07/16/2016 15:52    Assessment & Plan:   There are no diagnoses linked to this encounter. I have discontinued Joel Medina's B-12. I am also having him maintain his telmisartan, triamterene-hydrochlorothiazide, accu-chek safe-t pro, glucose blood, metFORMIN, Insulin Pen Needle, liraglutide, and Vitamin D (Ergocalciferol).  No orders of the defined types were placed in this encounter.    Follow-up: No Follow-up on file.  Walker Kehr, MD

## 2017-08-19 ENCOUNTER — Ambulatory Visit (INDEPENDENT_AMBULATORY_CARE_PROVIDER_SITE_OTHER): Payer: 59 | Admitting: Family Medicine

## 2017-08-19 ENCOUNTER — Encounter (INDEPENDENT_AMBULATORY_CARE_PROVIDER_SITE_OTHER): Payer: Self-pay

## 2017-08-19 ENCOUNTER — Encounter (INDEPENDENT_AMBULATORY_CARE_PROVIDER_SITE_OTHER): Payer: Self-pay | Admitting: Family Medicine

## 2017-08-19 ENCOUNTER — Ambulatory Visit (INDEPENDENT_AMBULATORY_CARE_PROVIDER_SITE_OTHER): Payer: 59 | Admitting: Physician Assistant

## 2017-08-19 VITALS — BP 122/80 | HR 74 | Temp 97.5°F | Ht 75.0 in | Wt 292.0 lb

## 2017-08-19 DIAGNOSIS — I10 Essential (primary) hypertension: Secondary | ICD-10-CM | POA: Diagnosis not present

## 2017-08-19 DIAGNOSIS — Z6836 Body mass index (BMI) 36.0-36.9, adult: Secondary | ICD-10-CM | POA: Diagnosis not present

## 2017-08-19 DIAGNOSIS — E559 Vitamin D deficiency, unspecified: Secondary | ICD-10-CM | POA: Diagnosis not present

## 2017-08-19 DIAGNOSIS — E1169 Type 2 diabetes mellitus with other specified complication: Secondary | ICD-10-CM | POA: Diagnosis not present

## 2017-08-19 NOTE — Progress Notes (Signed)
Office: [phone]  /  Fax: [phone]   HPI:   Chief Complaint: OBESITY Joel Medina is here to discuss his progress with his obesity treatment plan. He is on the keep a food journal with 1400-1500 calories and 90+ grams of protein daily and is following his eating plan approximately 85 % of the time. He states he is walking for 45 minutes 7 times per week. Joel Medina did 4-5 days of journaling on excel but then stopped and kept protein in mind without recording. Not using My[employer]itness[employer].  His weight is 292 lb (132.5 kg) today and has had a weight loss of 3 pounds over a period of 2 weeks since his last visit. He has lost 35 lbs since starting treatment with [place].  Diabetes II Joel Medina has a diagnosis of diabetes type II. Joel Medina is on Victoza 1.8 mg q AM, no GI side effects. He denies any hypoglycemic episodes. Last A1c was 5.7 on 07/10/17. He has been working on intensive lifestyle modifications including diet, exercise, and weight loss to help control his blood glucose levels.  Hypertension Joel Medina is a 57 y.o. male with hypertension. Joel Medina's blood pressure is controlled today. He denies chest pain, chest pressure, or headache. He is working weight loss to help control his blood pressure with the goal of decreasing his risk of heart attack and stroke. Joel Medina blood pressure is currently controlled.  Vitamin D Deficiency Joel Medina has a diagnosis of vitamin D deficiency. He is currently taking prescription Vit D, he still notes some fatigue and denies nausea, vomiting or muscle weakness.  ALLERGIES: Allergies  Allergen Reactions  . Amlodipine     Edema with 10 mg/d    MEDICATIONS: Current Outpatient Medications on File Prior to Visit  Medication Sig Dispense Refill  . glucose blood test strip Use as instructed 100 each 0  . Insulin Pen Needle 32G X 4 MM MISC Inject 1 Package into the skin every morning. 100 each 0  . Lancets (ACCU-CHEK SAFE-T PRO) lancets Use as instructed 100 each 0    . liraglutide (VICTOZA) 18 MG/3ML SOPN Inject 0.2 mLs (1.2 mg total) into the skin every morning. 5 pen 0  . metFORMIN (GLUCOPHAGE) 1000 MG tablet TAKE 1 TABLET (1,000 MG TOTAL) BY MOUTH 2 (TWO) TIMES DAILY WITH A MEAL. 180 tablet 3  . telmisartan (MICARDIS) 80 MG tablet Take 1 tablet (80 mg total) by mouth daily. 90 tablet 3  . triamterene-hydrochlorothiazide (MAXZIDE-25) 37.5-25 MG tablet Take 1 tablet by mouth daily. 90 tablet 3  . Vitamin D, Ergocalciferol, (DRISDOL) 50000 units CAPS capsule Take 1 capsule (50,000 Units total) by mouth every 7 (seven) days. 4 capsule 0   No current facility-administered medications on file prior to visit.     PAST MEDICAL HISTORY: Past Medical History:  Diagnosis Date  . Arthritis    OA BOTH HIPS - PAIN IN LEFT HIP WORSE  . Back pain   . Diabetes mellitus without complication (HCC)    oral meds  . History of pyelonephritis   . HTN (hypertension)   . Joint pain   . Obesity   . Prediabetes   . Swelling    feet and legs    PAST SURGICAL HISTORY: Past Surgical History:  Procedure Laterality Date  . [place]  . RIGHT ULNAR NERVE SURGERY TO REMOVE SCAR TISSUE  ? 2006  . TOTAL HIP ARTHROPLASTY Left 02/04/2013   Procedure: LEFT TOTAL HIP ARTHROPLASTY;  Surgeon: [name], MD;  Location: [facility];  Service: Orthopedics;  Laterality: Left;  . TOTAL HIP ARTHROPLASTY Right 11/24/2014   Procedure: RIGHT TOTAL HIP ARTHROPLASTY ANTERIOR APPROACH;  Surgeon: Gaynelle Arabian, MD;  Location: [facility];  Service: Orthopedics;  Laterality: Right;  . WISDOM TEETH EXTRACTIONS  ? 81  . WISDOM TOOTH EXTRACTION      SOCIAL HISTORY: Social History   Tobacco Use  . Smoking status: Never Smoker  . Smokeless tobacco: Never Used  Substance Use Topics  . Alcohol use: Yes    Alcohol/week: 7.2 oz    Types: 12 Cans of beer per week  . Drug use: No    FAMILY HISTORY: Family History  Problem Relation Age of Onset  . Hypertension Father   .  Obesity Father   . Hypertension Mother   . Cancer Mother   . Depression Mother   . Obesity Mother   . Hypertension Other   . Colon cancer Neg Hx   . Colon polyps Neg Hx   . Esophageal cancer Neg Hx   . Rectal cancer Neg Hx   . Stomach cancer Neg Hx     ROS: Review of Systems  Constitutional: Positive for malaise/fatigue and weight loss.  Cardiovascular: Negative for chest pain.       Negative chest pressure  Gastrointestinal: Negative for nausea and vomiting.  Musculoskeletal:       Negative muscle weakness  Neurological: Negative for headaches.  Endo/Heme/Allergies:       Negative hypoglycemia    PHYSICAL EXAM: Blood pressure 122/80, pulse 74, temperature (!) 97.5 F (36.4 C), temperature source Oral, height 6\' 3"  (1.905 m), weight 292 lb (132.5 kg), SpO2 97 %. Body mass index is 36.5 kg/m. Physical Exam  Constitutional: He is oriented to person, place, and time. He appears well-developed and well-nourished.  Cardiovascular: Normal rate.  Pulmonary/Chest: Effort normal.  Musculoskeletal: Normal range of motion.  Neurological: He is oriented to person, place, and time.  Skin: Skin is warm and dry.  Psychiatric: He has a normal mood and affect. His behavior is normal.  Vitals reviewed.   RECENT LABS AND TESTS: BMET    Component Value Date/Time   NA 137 07/10/2017 0901   K 4.3 07/10/2017 0901   CL 101 07/10/2017 0901   CO2 21 07/10/2017 0901   GLUCOSE 119 (H) 07/10/2017 0901   GLUCOSE 197 (H) 10/01/2016 1027   BUN 21 07/10/2017 0901   CREATININE 1.01 07/10/2017 0901   CALCIUM 9.5 07/10/2017 0901   GFRNONAA 83 07/10/2017 0901   GFRAA 96 07/10/2017 0901   Lab Results  Component Value Date   HGBA1C 5.7 (H) 07/10/2017   HGBA1C 5.2 04/03/2017   HGBA1C 6.0 (H) 12/27/2016   HGBA1C 7.4 (H) 10/01/2016   HGBA1C 7.7 (H) 06/29/2016   Lab Results  Component Value Date   INSULIN 84.2 (H) 07/10/2017   INSULIN 34.5 (H) 04/03/2017   INSULIN 53.0 (H) 12/27/2016    CBC    Component Value Date/Time   WBC 6.9 12/27/2016 1125   WBC 6.8 10/01/2016 1027   RBC 4.75 12/27/2016 1125   RBC 4.86 10/01/2016 1027   HGB 15.6 12/27/2016 1125   HCT 44.4 12/27/2016 1125   PLT 212 12/27/2016 1125   MCV 94 12/27/2016 1125   MCH 32.8 12/27/2016 1125   MCH 31.7 11/25/2014 0435   MCHC 35.1 12/27/2016 1125   MCHC 34.9 10/01/2016 1027   RDW 14.8 12/27/2016 1125   LYMPHSABS 2.3 12/27/2016 1125   MONOABS 0.4 10/01/2016  1027   EOSABS 0.1 12/27/2016 1125   BASOSABS 0.0 12/27/2016 1125   Iron/TIBC/Ferritin/ %Sat No results found for: IRON, TIBC, FERRITIN, IRONPCTSAT Lipid Panel     Component Value Date/Time   CHOL 158 07/10/2017 0901   TRIG 244 (H) 07/10/2017 0901   HDL 34 (L) 07/10/2017 0901   CHOLHDL 4 10/01/2016 1027   VLDL 33.8 10/01/2016 1027   LDLCALC 75 07/10/2017 0901   LDLDIRECT 93.3 04/02/2014 1632   Hepatic Function Panel     Component Value Date/Time   PROT 6.8 07/10/2017 0901   ALBUMIN 4.2 07/10/2017 0901   AST 18 07/10/2017 0901   ALT 21 07/10/2017 0901   ALKPHOS 71 07/10/2017 0901   BILITOT 0.7 07/10/2017 0901   BILIDIR 0.2 10/01/2016 1027      Component Value Date/Time   TSH 3.180 12/27/2016 1125   TSH 2.60 10/01/2016 1027   TSH 3.26 06/29/2016 1205  Results for LERON, STOFFERS (MRN [id number]) as of 08/19/2017 18:12  Ref. Range 07/10/2017 09:01  Vitamin D, 25-Hydroxy Latest Ref Range: 30.0 - 100.0 ng/mL 25.6 (L)    ASSESSMENT AND PLAN: Controlled type 2 diabetes mellitus with other specified complication, without long-term current use of insulin (HCC)  Essential hypertension  Vitamin D deficiency  Class 2 severe obesity with serious comorbidity and body mass index (BMI) of 36.0 to 36.9 in adult, unspecified obesity type ([place])  PLAN:  Diabetes II Joel Medina has been given extensive diabetes education by myself today including ideal fasting and post-prandial blood glucose readings, individual ideal Hgb A1c goals and  hypoglycemia prevention. We discussed the importance of good blood sugar control to decrease the likelihood of diabetic complications such as nephropathy, neuropathy, limb loss, blindness, coronary artery disease, and death. We discussed the importance of intensive lifestyle modification including diet, exercise and weight loss as the first line treatment for diabetes. Joel Medina agrees to continue Victoza 1.8 mg q AM and metformin and she agrees to follow up with our clinic in 2 weeks.  Hypertension We discussed sodium restriction, working on healthy weight loss, and a regular exercise program as the means to achieve improved blood pressure control. Joel Medina agreed with this plan and agreed to follow up as directed. We will continue to monitor his blood pressure as well as his progress with the above lifestyle modifications. Joel Medina agrees to continue telmisartan and Maxzide as prescribed and will watch for signs of hypotension as he continues his lifestyle modifications. Joel Medina agrees to follow up with our clinic in 2 weeks.  Vitamin D Deficiency Joel Medina was informed that low vitamin D levels contributes to fatigue and are associated with obesity, breast, and colon cancer. Joel Medina agrees to continue taking prescription Vit D @50 ,000 IU every week #4 and will follow up for routine testing of vitamin D, at least 2-3 times per year. He was informed of the risk of over-replacement of vitamin D and agrees to not increase his dose unless he discusses this with [place] first. Joel Medina agrees to follow up with our clinic in 2 weeks.  We spent > than 50% of the 15 minute visit on the counseling as documented in the note.  Obesity Joel Medina is currently in the action stage of change. As such, his goal is to continue with weight loss efforts He has agreed to keep a food journal with 1400-1500 calories and 90+ grams of protein daily Joel Medina has been instructed to work up to a goal of 150 minutes of combined cardio and strengthening  exercise per  week for weight loss and overall health benefits. We discussed the following Behavioral Modification Strategies today: increasing lean protein intake, work on meal planning and easy cooking plans, increase H20 intake, and keep a strict food journal   Joel Medina has agreed to follow up with our clinic in 2 weeks. He was informed of the importance of frequent follow up visits to maximize his success with intensive lifestyle modifications for his multiple health conditions.   OBESITY BEHAVIORAL INTERVENTION VISIT  Today's visit was # 16 out of 22.  Starting weight: 327 lbs Starting date: 12/27/16 Today's weight : 292 lbs Today's date: 08/19/2017 Total lbs lost to date: 2 (Patients must lose 7 lbs in the first 6 months to continue with counseling)   ASK: We discussed the diagnosis of obesity with Joel Medina today and Joel Medina agreed to give [place] permission to discuss obesity behavioral modification therapy today.  ASSESS: Joel Medina has the diagnosis of obesity and his BMI today is 55.5 Joel Medina is in the action stage of change   ADVISE: Joel Medina was educated on the multiple health risks of obesity as well as the benefit of weight loss to improve his health. He was advised of the need for long term treatment and the importance of lifestyle modifications.  AGREE: Multiple dietary modification options and treatment options were discussed and  Joel Medina agreed to the above obesity treatment plan.  I, Trixie Dredge, am acting as transcriptionist for Ilene Qua, MD  I have reviewed the above documentation for accuracy and completeness, and I agree with the above. - Ilene Qua, MD

## 2017-09-02 ENCOUNTER — Ambulatory Visit (INDEPENDENT_AMBULATORY_CARE_PROVIDER_SITE_OTHER): Payer: 59 | Admitting: Physician Assistant

## 2017-09-02 VITALS — BP 110/75 | HR 79 | Temp 98.0°F | Ht 75.0 in | Wt 289.0 lb

## 2017-09-02 DIAGNOSIS — Z6836 Body mass index (BMI) 36.0-36.9, adult: Secondary | ICD-10-CM

## 2017-09-02 DIAGNOSIS — E119 Type 2 diabetes mellitus without complications: Secondary | ICD-10-CM

## 2017-09-02 DIAGNOSIS — Z9189 Other specified personal risk factors, not elsewhere classified: Secondary | ICD-10-CM

## 2017-09-02 DIAGNOSIS — E559 Vitamin D deficiency, unspecified: Secondary | ICD-10-CM

## 2017-09-02 MED ORDER — INSULIN PEN NEEDLE 32G X 4 MM MISC: 1.0000 | Freq: Every morning | 0 refills | Status: DC

## 2017-09-02 MED ORDER — VITAMIN D (ERGOCALCIFEROL) 1.25 MG (50000 UNIT) PO CAPS: 50000.0000 [IU] | ORAL_CAPSULE | ORAL | 0 refills | Status: DC

## 2017-09-02 MED ORDER — LIRAGLUTIDE 18 MG/3ML ~~LOC~~ SOPN: 1.8000 mg | PEN_INJECTOR | Freq: Every morning | SUBCUTANEOUS | 0 refills | Status: DC

## 2017-09-02 NOTE — Progress Notes (Signed)
Office: [phone]  /  Fax: [phone]   HPI:   Chief Complaint: OBESITY Joel Medina is here to discuss his progress with his obesity treatment plan. He is on the Category 3 plan and is following his eating plan approximately 80 % of the time. He states he is walking for 60 minutes 7 times per week. Joel Medina continues to do well with weight loss. He states he managed to control his portions and and smarter food choices. He would like more convenient meal options.  His weight is 289 lb (131.1 kg) today and has had a weight loss of 3 pounds over a period of 2 weeks since his last visit. He has lost 38 lbs since starting treatment with [place].  Diabetes II Joel Medina has a diagnosis of diabetes type II. Joel Medina states he is not checking BGs at home. He denies any hypoglycemic episodes. Last A1c was 5.7 on 07/10/17. He has been working on intensive lifestyle modifications including diet, exercise, and weight loss to help control his blood glucose levels.  Vitamin D Deficiency Joel Medina has a diagnosis of vitamin D deficiency. He is currently taking prescription Vit D and denies nausea, vomiting or muscle weakness.  At risk for osteopenia and osteoporosis Joel Medina is at higher risk of osteopenia and osteoporosis due to vitamin D deficiency.   ALLERGIES: Allergies  Allergen Reactions  . Amlodipine     Edema with 10 mg/d    MEDICATIONS: Current Outpatient Medications on File Prior to Visit  Medication Sig Dispense Refill  . glucose blood test strip Use as instructed 100 each 0  . Lancets (ACCU-CHEK SAFE-T PRO) lancets Use as instructed 100 each 0  . metFORMIN (GLUCOPHAGE) 1000 MG tablet TAKE 1 TABLET (1,000 MG TOTAL) BY MOUTH 2 (TWO) TIMES DAILY WITH A MEAL. 180 tablet 3  . telmisartan (MICARDIS) 80 MG tablet Take 1 tablet (80 mg total) by mouth daily. 90 tablet 3  . triamterene-hydrochlorothiazide (MAXZIDE-25) 37.5-25 MG tablet Take 1 tablet by mouth daily. 90 tablet 3   No current  facility-administered medications on file prior to visit.     PAST MEDICAL HISTORY: Past Medical History:  Diagnosis Date  . Arthritis    OA BOTH HIPS - PAIN IN LEFT HIP WORSE  . Back pain   . Diabetes mellitus without complication (HCC)    oral meds  . History of pyelonephritis   . HTN (hypertension)   . Joint pain   . Obesity   . Prediabetes   . Swelling    feet and legs    PAST SURGICAL HISTORY: Past Surgical History:  Procedure Laterality Date  . [place]  . RIGHT ULNAR NERVE SURGERY TO REMOVE SCAR TISSUE  ? 2006  . TOTAL HIP ARTHROPLASTY Left 02/04/2013   Procedure: LEFT TOTAL HIP ARTHROPLASTY;  Surgeon: [name], MD;  Location: [facility];  Service: Orthopedics;  Laterality: Left;  . TOTAL HIP ARTHROPLASTY Right 11/24/2014   Procedure: RIGHT TOTAL HIP ARTHROPLASTY ANTERIOR APPROACH;  Surgeon: Gaynelle Arabian, MD;  Location: [facility];  Service: Orthopedics;  Laterality: Right;  . WISDOM TEETH EXTRACTIONS  ? 48  . WISDOM TOOTH EXTRACTION      SOCIAL HISTORY: Social History   Tobacco Use  . Smoking status: Never Smoker  . Smokeless tobacco: Never Used  Substance Use Topics  . Alcohol use: Yes    Alcohol/week: 7.2 oz    Types: 12 Cans of beer per week  . Drug use: No    FAMILY  HISTORY: Family History  Problem Relation Age of Onset  . Hypertension Father   . Obesity Father   . Hypertension Mother   . Cancer Mother   . Depression Mother   . Obesity Mother   . Hypertension Other   . Colon cancer Neg Hx   . Colon polyps Neg Hx   . Esophageal cancer Neg Hx   . Rectal cancer Neg Hx   . Stomach cancer Neg Hx     ROS: Review of Systems  Constitutional: Positive for weight loss.  Gastrointestinal: Negative for nausea and vomiting.  Musculoskeletal:       Negative muscle weakness  Endo/Heme/Allergies:       Negative hypoglycemia    PHYSICAL EXAM: Blood pressure 110/75, pulse 79, temperature 98 F (36.7 C), temperature source Oral,  height 6\' 3"  (1.905 m), weight 289 lb (131.1 kg), SpO2 97 %. Body mass index is 36.12 kg/m. Physical Exam  Constitutional: He is oriented to person, place, and time. He appears well-developed and well-nourished.  Cardiovascular: Normal rate.  Pulmonary/Chest: Effort normal.  Musculoskeletal: Normal range of motion.  Neurological: He is oriented to person, place, and time.  Skin: Skin is warm and dry.  Psychiatric: He has a normal mood and affect. His behavior is normal.  Vitals reviewed.   RECENT LABS AND TESTS: BMET    Component Value Date/Time   NA 137 07/10/2017 0901   K 4.3 07/10/2017 0901   CL 101 07/10/2017 0901   CO2 21 07/10/2017 0901   GLUCOSE 119 (H) 07/10/2017 0901   GLUCOSE 197 (H) 10/01/2016 1027   BUN 21 07/10/2017 0901   CREATININE 1.01 07/10/2017 0901   CALCIUM 9.5 07/10/2017 0901   GFRNONAA 83 07/10/2017 0901   GFRAA 96 07/10/2017 0901   Lab Results  Component Value Date   HGBA1C 5.7 (H) 07/10/2017   HGBA1C 5.2 04/03/2017   HGBA1C 6.0 (H) 12/27/2016   HGBA1C 7.4 (H) 10/01/2016   HGBA1C 7.7 (H) 06/29/2016   Lab Results  Component Value Date   INSULIN 84.2 (H) 07/10/2017   INSULIN 34.5 (H) 04/03/2017   INSULIN 53.0 (H) 12/27/2016   CBC    Component Value Date/Time   WBC 6.9 12/27/2016 1125   WBC 6.8 10/01/2016 1027   RBC 4.75 12/27/2016 1125   RBC 4.86 10/01/2016 1027   HGB 15.6 12/27/2016 1125   HCT 44.4 12/27/2016 1125   PLT 212 12/27/2016 1125   MCV 94 12/27/2016 1125   MCH 32.8 12/27/2016 1125   MCH 31.7 11/25/2014 0435   MCHC 35.1 12/27/2016 1125   MCHC 34.9 10/01/2016 1027   RDW 14.8 12/27/2016 1125   LYMPHSABS 2.3 12/27/2016 1125   MONOABS 0.4 10/01/2016 1027   EOSABS 0.1 12/27/2016 1125   BASOSABS 0.0 12/27/2016 1125   Iron/TIBC/Ferritin/ %Sat No results found for: IRON, TIBC, FERRITIN, IRONPCTSAT Lipid Panel     Component Value Date/Time   CHOL 158 07/10/2017 0901   TRIG 244 (H) 07/10/2017 0901   HDL 34 (L) 07/10/2017  0901   CHOLHDL 4 10/01/2016 1027   VLDL 33.8 10/01/2016 1027   LDLCALC 75 07/10/2017 0901   LDLDIRECT 93.3 04/02/2014 1632   Hepatic Function Panel     Component Value Date/Time   PROT 6.8 07/10/2017 0901   ALBUMIN 4.2 07/10/2017 0901   AST 18 07/10/2017 0901   ALT 21 07/10/2017 0901   ALKPHOS 71 07/10/2017 0901   BILITOT 0.7 07/10/2017 0901   BILIDIR 0.2 10/01/2016 1027  Component Value Date/Time   TSH 3.180 12/27/2016 1125   TSH 2.60 10/01/2016 1027   TSH 3.26 06/29/2016 1205  Results for MAYCOL, HOYING (MRN [id number]) as of 09/02/2017 12:35  Ref. Range 07/10/2017 09:01  Vitamin D, 25-Hydroxy Latest Ref Range: 30.0 - 100.0 ng/mL 25.6 (L)    ASSESSMENT AND PLAN: Type 2 diabetes mellitus without complication, without long-term current use of insulin (HCC) - Plan: liraglutide (VICTOZA) 18 MG/3ML SOPN, Insulin Pen Needle 32G X 4 MM MISC  Vitamin D deficiency - Plan: Vitamin D, Ergocalciferol, (DRISDOL) 50000 units CAPS capsule  At risk for osteopenia  Class 2 severe obesity with serious comorbidity and body mass index (BMI) of 36.0 to 36.9 in adult, unspecified obesity type ([place])  PLAN:  Diabetes II Joel Medina has been given extensive diabetes education by myself today including ideal fasting and post-prandial blood glucose readings, individual ideal Hgb A1c goals and hypoglycemia prevention. We discussed the importance of good blood sugar control to decrease the likelihood of diabetic complications such as nephropathy, neuropathy, limb loss, blindness, coronary artery disease, and death. We discussed the importance of intensive lifestyle modification including diet, exercise and weight loss as the first line treatment for diabetes. Joel Medina agrees to continue Victoza 3 pens (Pt at 1.8 mg) and we will refill for 1 month and we will refill pen needles for 1 month. Joel Medina agrees to follow up with our clinic in 2 weeks.  Vitamin D Deficiency Joel Medina was informed that low vitamin D  levels contributes to fatigue and are associated with obesity, breast, and colon cancer. Joel Medina agrees to continue taking prescription Vit D @50 ,000 IU every week #4 and we will refill for 1 month. He will follow up for routine testing of vitamin D, at least 2-3 times per year. He was informed of the risk of over-replacement of vitamin D and agrees to not increase his dose unless he discusses this with [place] first. Joel Medina agrees to follow up with our clinic in 2 weeks.  At risk for osteopenia and osteoporosis Joel Medina is at risk for osteopenia and osteoporsis due to his vitamin D deficiency. He was encouraged to take his vitamin D and follow his higher calcium diet and increase strengthening exercise to help strengthen his bones and decrease his risk of osteopenia and osteoporosis.  Obesity Joel Medina is currently in the action stage of change. As such, his goal is to continue with weight loss efforts He has agreed to follow the Category 3 plan Joel Medina has been instructed to work up to a goal of 150 minutes of combined cardio and strengthening exercise per week for weight loss and overall health benefits. We discussed the following Behavioral Modification Strategies today: increasing lean protein intake and work on meal planning and easy cooking plans   Joel Medina has agreed to follow up with our clinic in 2 weeks. He was informed of the importance of frequent follow up visits to maximize his success with intensive lifestyle modifications for his multiple health conditions.   OBESITY BEHAVIORAL INTERVENTION VISIT  Today's visit was # 17 out of 22.  Starting weight: 327 lbs Starting date: 12/27/16 Today's weight : 289 lbs Today's date: 09/02/2017 Total lbs lost to date: 83 (Patients must lose 7 lbs in the first 6 months to continue with counseling)   ASK: We discussed the diagnosis of obesity with Joel Medina today and Joel Medina agreed to give [place] permission to discuss obesity behavioral modification  therapy today.  ASSESS: Joel Medina has the diagnosis of obesity  and his BMI today is 36.12 Joel Medina is in the action stage of change   ADVISE: Joel Medina was educated on the multiple health risks of obesity as well as the benefit of weight loss to improve his health. He was advised of the need for long term treatment and the importance of lifestyle modifications.  AGREE: Multiple dietary modification options and treatment options were discussed and  Joel Medina agreed to the above obesity treatment plan.   Wilhemena Durie, am acting as transcriptionist for Lacy Duverney, PA-C I, Lacy Duverney [facility], have reviewed this note and agree with its content

## 2017-09-16 ENCOUNTER — Encounter (INDEPENDENT_AMBULATORY_CARE_PROVIDER_SITE_OTHER): Payer: Self-pay | Admitting: Physician Assistant

## 2017-09-16 ENCOUNTER — Ambulatory Visit (INDEPENDENT_AMBULATORY_CARE_PROVIDER_SITE_OTHER): Payer: 59 | Admitting: Physician Assistant

## 2017-09-16 VITALS — BP 109/76 | HR 80 | Temp 97.6°F | Ht 75.0 in | Wt 291.0 lb

## 2017-09-16 DIAGNOSIS — E559 Vitamin D deficiency, unspecified: Secondary | ICD-10-CM | POA: Diagnosis not present

## 2017-09-16 DIAGNOSIS — E119 Type 2 diabetes mellitus without complications: Secondary | ICD-10-CM

## 2017-09-16 DIAGNOSIS — Z6836 Body mass index (BMI) 36.0-36.9, adult: Secondary | ICD-10-CM | POA: Diagnosis not present

## 2017-09-16 DIAGNOSIS — Z9189 Other specified personal risk factors, not elsewhere classified: Secondary | ICD-10-CM

## 2017-09-16 MED ORDER — VITAMIN D (ERGOCALCIFEROL) 1.25 MG (50000 UNIT) PO CAPS: 50000.0000 [IU] | ORAL_CAPSULE | ORAL | 0 refills | Status: DC

## 2017-09-16 NOTE — Progress Notes (Signed)
Office: [phone]  /  Fax: [phone]   HPI:   Chief Complaint: OBESITY Joel Medina is here to discuss his progress with his obesity treatment plan. He is on the Category 3 plan and is following his eating plan approximately 80 % of the time. He states he is walking for 60 minutes 7 times per week. Joel Medina continues to have challenges getting all the protein on the meal plan. Also, he has not kept up with his protein as well.  His weight is 291 lb (132 kg) today and has gained 2 pounds since his last visit. He has lost 36 lbs since starting treatment with [place].  Diabetes II Joel Medina has a diagnosis of diabetes type II. Joel Medina states he is not checking BGs at home. He was advised about bringing log in to adjust his diabetes mellitus medications and possible discontinuation of Victoza or metformin. He denies any hypoglycemic episodes. Last A1c was 5.7 on 07/10/17. He has been working on intensive lifestyle modifications including diet, exercise, and weight loss to help control his blood glucose levels.  Vitamin D Deficiency Joel Medina has a diagnosis of vitamin D deficiency. He is currently taking prescription Vit D and denies nausea, vomiting or muscle weakness.  At risk for osteopenia and osteoporosis Joel Medina is at higher risk of osteopenia and osteoporosis due to vitamin D deficiency.   ALLERGIES: Allergies  Allergen Reactions  . Amlodipine     Edema with 10 mg/d    MEDICATIONS: Current Outpatient Medications on File Prior to Visit  Medication Sig Dispense Refill  . glucose blood test strip Use as instructed 100 each 0  . Insulin Pen Needle 32G X 4 MM MISC Inject 1 Package into the skin every morning. 100 each 0  . Lancets (ACCU-CHEK SAFE-T PRO) lancets Use as instructed 100 each 0  . liraglutide (VICTOZA) 18 MG/3ML SOPN Inject 0.3 mLs (1.8 mg total) into the skin every morning. 3 pen 0  . metFORMIN (GLUCOPHAGE) 1000 MG tablet TAKE 1 TABLET (1,000 MG TOTAL) BY MOUTH 2 (TWO) TIMES DAILY  WITH A MEAL. 180 tablet 3  . telmisartan (MICARDIS) 80 MG tablet Take 1 tablet (80 mg total) by mouth daily. 90 tablet 3  . triamterene-hydrochlorothiazide (MAXZIDE-25) 37.5-25 MG tablet Take 1 tablet by mouth daily. 90 tablet 3  . Vitamin D, Ergocalciferol, (DRISDOL) 50000 units CAPS capsule Take 1 capsule (50,000 Units total) by mouth every 7 (seven) days. 4 capsule 0   No current facility-administered medications on file prior to visit.     PAST MEDICAL HISTORY: Past Medical History:  Diagnosis Date  . Arthritis    OA BOTH HIPS - PAIN IN LEFT HIP WORSE  . Back pain   . Diabetes mellitus without complication (HCC)    oral meds  . History of pyelonephritis   . HTN (hypertension)   . Joint pain   . Obesity   . Prediabetes   . Swelling    feet and legs    PAST SURGICAL HISTORY: Past Surgical History:  Procedure Laterality Date  . [place]  . RIGHT ULNAR NERVE SURGERY TO REMOVE SCAR TISSUE  ? 2006  . TOTAL HIP ARTHROPLASTY Left 02/04/2013   Procedure: LEFT TOTAL HIP ARTHROPLASTY;  Surgeon: [name], MD;  Location: [facility];  Service: Orthopedics;  Laterality: Left;  . TOTAL HIP ARTHROPLASTY Right 11/24/2014   Procedure: RIGHT TOTAL HIP ARTHROPLASTY ANTERIOR APPROACH;  Surgeon: Gaynelle Arabian, MD;  Location: [facility];  Service: Orthopedics;  Laterality:  Right;  [place] WISDOM TEETH EXTRACTIONS  ? 81  . WISDOM TOOTH EXTRACTION      SOCIAL HISTORY: Social History   Tobacco Use  . Smoking status: Never Smoker  . Smokeless tobacco: Never Used  Substance Use Topics  . Alcohol use: Yes    Alcohol/week: 7.2 oz    Types: 12 Cans of beer per week  . Drug use: No    FAMILY HISTORY: Family History  Problem Relation Age of Onset  . Hypertension Father   . Obesity Father   . Hypertension Mother   . Cancer Mother   . Depression Mother   . Obesity Mother   . Hypertension Other   . Colon cancer Neg Hx   . Colon polyps Neg Hx   . Esophageal cancer Neg Hx   .  Rectal cancer Neg Hx   . Stomach cancer Neg Hx     ROS: Review of Systems  Constitutional: Negative for weight loss.  Gastrointestinal: Negative for nausea and vomiting.  Musculoskeletal:       Negative muscle weakness  Endo/Heme/Allergies:       Negative hypoglycemia    PHYSICAL EXAM: Blood pressure 109/76, pulse 80, temperature 97.6 F (36.4 C), temperature source Oral, height 6\' 3"  (1.905 m), weight 291 lb (132 kg), SpO2 97 %. Body mass index is 36.37 kg/m. Physical Exam  Constitutional: He is oriented to person, place, and time. He appears well-developed and well-nourished.  Cardiovascular: Normal rate.  Pulmonary/Chest: Effort normal.  Musculoskeletal: Normal range of motion.  Neurological: He is oriented to person, place, and time.  Skin: Skin is warm and dry.  Psychiatric: He has a normal mood and affect. His behavior is normal.  Vitals reviewed.   RECENT LABS AND TESTS: BMET    Component Value Date/Time   NA 137 07/10/2017 0901   K 4.3 07/10/2017 0901   CL 101 07/10/2017 0901   CO2 21 07/10/2017 0901   GLUCOSE 119 (H) 07/10/2017 0901   GLUCOSE 197 (H) 10/01/2016 1027   BUN 21 07/10/2017 0901   CREATININE 1.01 07/10/2017 0901   CALCIUM 9.5 07/10/2017 0901   GFRNONAA 83 07/10/2017 0901   GFRAA 96 07/10/2017 0901   Lab Results  Component Value Date   HGBA1C 5.7 (H) 07/10/2017   HGBA1C 5.2 04/03/2017   HGBA1C 6.0 (H) 12/27/2016   HGBA1C 7.4 (H) 10/01/2016   HGBA1C 7.7 (H) 06/29/2016   Lab Results  Component Value Date   INSULIN 84.2 (H) 07/10/2017   INSULIN 34.5 (H) 04/03/2017   INSULIN 53.0 (H) 12/27/2016   CBC    Component Value Date/Time   WBC 6.9 12/27/2016 1125   WBC 6.8 10/01/2016 1027   RBC 4.75 12/27/2016 1125   RBC 4.86 10/01/2016 1027   HGB 15.6 12/27/2016 1125   HCT 44.4 12/27/2016 1125   PLT 212 12/27/2016 1125   MCV 94 12/27/2016 1125   MCH 32.8 12/27/2016 1125   MCH 31.7 11/25/2014 0435   MCHC 35.1 12/27/2016 1125   MCHC 34.9  10/01/2016 1027   RDW 14.8 12/27/2016 1125   LYMPHSABS 2.3 12/27/2016 1125   MONOABS 0.4 10/01/2016 1027   EOSABS 0.1 12/27/2016 1125   BASOSABS 0.0 12/27/2016 1125   Iron/TIBC/Ferritin/ %Sat No results found for: IRON, TIBC, FERRITIN, IRONPCTSAT Lipid Panel     Component Value Date/Time   CHOL 158 07/10/2017 0901   TRIG 244 (H) 07/10/2017 0901   HDL 34 (L) 07/10/2017 0901   CHOLHDL 4 10/01/2016 1027  VLDL 33.8 10/01/2016 1027   LDLCALC 75 07/10/2017 0901   LDLDIRECT 93.3 04/02/2014 1632   Hepatic Function Panel     Component Value Date/Time   PROT 6.8 07/10/2017 0901   ALBUMIN 4.2 07/10/2017 0901   AST 18 07/10/2017 0901   ALT 21 07/10/2017 0901   ALKPHOS 71 07/10/2017 0901   BILITOT 0.7 07/10/2017 0901   BILIDIR 0.2 10/01/2016 1027      Component Value Date/Time   TSH 3.180 12/27/2016 1125   TSH 2.60 10/01/2016 1027   TSH 3.26 06/29/2016 1205  Results for MORRIE, DAYWALT (MRN [id number]) as of 09/16/2017 09:35  Ref. Range 07/10/2017 09:01  Vitamin D, 25-Hydroxy Latest Ref Range: 30.0 - 100.0 ng/mL 25.6 (L)    ASSESSMENT AND PLAN: Type 2 diabetes mellitus without complication, without long-term current use of insulin (HCC)  Vitamin D deficiency - Plan: Vitamin D, Ergocalciferol, (DRISDOL) 50000 units CAPS capsule  At risk for osteoporosis  Class 2 severe obesity with serious comorbidity and body mass index (BMI) of 36.0 to 36.9 in adult, unspecified obesity type ([place])  PLAN:  Diabetes II Joel Medina has been given extensive diabetes education by myself today including ideal fasting and post-prandial blood glucose readings, individual ideal Hgb A1c goals and hypoglycemia prevention. We discussed the importance of good blood sugar control to decrease the likelihood of diabetic complications such as nephropathy, neuropathy, limb loss, blindness, coronary artery disease, and death. We discussed the importance of intensive lifestyle modification including diet, exercise and  weight loss as the first line treatment for diabetes. Lyndle agrees to continue his diabetes medications and he agrees to follow up with our clinic in 2 weeks.  Vitamin D Deficiency Joel Medina was informed that low vitamin D levels contributes to fatigue and are associated with obesity, breast, and colon cancer. Joel Medina agrees to continue taking prescription Vit D @50 ,000 IU every week #4 and we will refill for 1 month. He will follow up for routine testing of vitamin D, at least 2-3 times per year. He was informed of the risk of over-replacement of vitamin D and agrees to not increase his dose unless he discusses this with [place] first. Joel Medina agrees to follow up with our clinic in 2 weeks.  At risk for osteopenia and osteoporosis Joel Medina is at risk for osteopenia and osteoporsis due to his vitamin D deficiency. He was encouraged to take his vitamin D and follow his higher calcium diet and increase strengthening exercise to help strengthen his bones and decrease his risk of osteopenia and osteoporosis.  Obesity Joel Medina is currently in the action stage of change. As such, his goal is to continue with weight loss efforts He has agreed to follow the Category 4 plan Joel Medina has been instructed to work up to a goal of 150 minutes of combined cardio and strengthening exercise per week for weight loss and overall health benefits. We discussed the following Behavioral Modification Strategies today: increasing lean protein intake, increase H20 intake, and no skipping meals   Joel Medina has agreed to follow up with our clinic in 2 weeks. He was informed of the importance of frequent follow up visits to maximize his success with intensive lifestyle modifications for his multiple health conditions.   OBESITY BEHAVIORAL INTERVENTION VISIT  Today's visit was # 18 out of 22.  Starting weight: 327 lbs Starting date: 12/27/16 Today's weight : 291 lbs Today's date: 09/16/2017 Total lbs lost to date: 58 (Patients must lose 7  lbs in the first 6 months to  continue with counseling)   ASK: We discussed the diagnosis of obesity with Joel Medina today and Joel Medina agreed to give [place] permission to discuss obesity behavioral modification therapy today.  ASSESS: Joel Medina has the diagnosis of obesity and his BMI today is 36.37 Joel Medina is in the action stage of change   ADVISE: Joel Medina was educated on the multiple health risks of obesity as well as the benefit of weight loss to improve his health. He was advised of the need for long term treatment and the importance of lifestyle modifications.  AGREE: Multiple dietary modification options and treatment options were discussed and  Joel Medina agreed to the above obesity treatment plan.   Wilhemena Durie, am acting as transcriptionist for Lacy Duverney, PA-C I, Lacy Duverney [facility], have reviewed this note and agree with its content

## 2017-10-02 ENCOUNTER — Ambulatory Visit (INDEPENDENT_AMBULATORY_CARE_PROVIDER_SITE_OTHER): Payer: 59 | Admitting: Physician Assistant

## 2017-10-02 VITALS — BP 100/68 | HR 72 | Temp 98.4°F | Ht 75.0 in | Wt 288.0 lb

## 2017-10-02 DIAGNOSIS — Z9189 Other specified personal risk factors, not elsewhere classified: Secondary | ICD-10-CM

## 2017-10-02 DIAGNOSIS — E559 Vitamin D deficiency, unspecified: Secondary | ICD-10-CM | POA: Diagnosis not present

## 2017-10-02 DIAGNOSIS — E1165 Type 2 diabetes mellitus with hyperglycemia: Secondary | ICD-10-CM | POA: Diagnosis not present

## 2017-10-02 DIAGNOSIS — Z6836 Body mass index (BMI) 36.0-36.9, adult: Secondary | ICD-10-CM

## 2017-10-02 MED ORDER — LIRAGLUTIDE 18 MG/3ML ~~LOC~~ SOPN: 1.8000 mg | PEN_INJECTOR | Freq: Every morning | SUBCUTANEOUS | 0 refills | Status: DC

## 2017-10-02 MED ORDER — VITAMIN D (ERGOCALCIFEROL) 1.25 MG (50000 UNIT) PO CAPS: 50000.0000 [IU] | ORAL_CAPSULE | ORAL | 0 refills | Status: DC

## 2017-10-02 NOTE — Progress Notes (Signed)
Office: [phone]  /  Fax: [phone]   HPI:   Chief Complaint: OBESITY Joel Medina is here to discuss his progress with his obesity treatment plan. He is on the Category 4 plan and is following his eating plan approximately 70 % of the time. He states he is walking for 15 to 20 minutes 5 to 6 times per week. Joel Medina continues to do well with weight loss. He is following the meal plan more closely and states his hunger is well controlled. His weight is 288 lb (130.6 kg) today and has had a weight loss of 3 pounds over a period of 2 weeks since his last visit. He has lost 39 lbs since starting treatment with [place].  Vitamin D deficiency Joel Medina has a diagnosis of vitamin D deficiency. He is currently taking vit D and denies nausea, vomiting or muscle weakness.  At risk for osteopenia and osteoporosis Joel Medina is at higher risk of osteopenia and osteoporosis due to vitamin D deficiency.   Diabetes II with hyperglycemia Joel Medina has a diagnosis of diabetes type II. Joel Medina states fasting BGs range between 100's and 130's. Post prandial at the 150s. Joel Medina denies any hypoglycemic episodes. Joel Medina is currently on victoza and metformin. He has been working on intensive lifestyle modifications including diet, exercise, and weight loss to help control his blood glucose levels.  ALLERGIES: Allergies  Allergen Reactions  . Amlodipine     Edema with 10 mg/d    MEDICATIONS: Current Outpatient Medications on File Prior to Visit  Medication Sig Dispense Refill  . glucose blood test strip Use as instructed 100 each 0  . Insulin Pen Needle 32G X 4 MM MISC Inject 1 Package into the skin every morning. 100 each 0  . Lancets (ACCU-CHEK SAFE-T PRO) lancets Use as instructed 100 each 0  . metFORMIN (GLUCOPHAGE) 1000 MG tablet TAKE 1 TABLET (1,000 MG TOTAL) BY MOUTH 2 (TWO) TIMES DAILY WITH A MEAL. 180 tablet 3  . telmisartan (MICARDIS) 80 MG tablet Take 1 tablet (80 mg total) by mouth daily. 90 tablet 3  .  triamterene-hydrochlorothiazide (MAXZIDE-25) 37.5-25 MG tablet Take 1 tablet by mouth daily. 90 tablet 3   No current facility-administered medications on file prior to visit.     PAST MEDICAL HISTORY: Past Medical History:  Diagnosis Date  . Arthritis    OA BOTH HIPS - PAIN IN LEFT HIP WORSE  . Back pain   . Diabetes mellitus without complication (HCC)    oral meds  . History of pyelonephritis   . HTN (hypertension)   . Joint pain   . Obesity   . Prediabetes   . Swelling    feet and legs    PAST SURGICAL HISTORY: Past Surgical History:  Procedure Laterality Date  . [place]  . RIGHT ULNAR NERVE SURGERY TO REMOVE SCAR TISSUE  ? 2006  . TOTAL HIP ARTHROPLASTY Left 02/04/2013   Procedure: LEFT TOTAL HIP ARTHROPLASTY;  Surgeon: [name], MD;  Location: [facility];  Service: Orthopedics;  Laterality: Left;  . TOTAL HIP ARTHROPLASTY Right 11/24/2014   Procedure: RIGHT TOTAL HIP ARTHROPLASTY ANTERIOR APPROACH;  Surgeon: Gaynelle Arabian, MD;  Location: [facility];  Service: Orthopedics;  Laterality: Right;  . WISDOM TEETH EXTRACTIONS  ? 13  . WISDOM TOOTH EXTRACTION      SOCIAL HISTORY: Social History   Tobacco Use  . Smoking status: Never Smoker  . Smokeless tobacco: Never Used  Substance Use Topics  . Alcohol use:  Yes    Alcohol/week: 7.2 oz    Types: 12 Cans of beer per week  . Drug use: No    FAMILY HISTORY: Family History  Problem Relation Age of Onset  . Hypertension Father   . Obesity Father   . Hypertension Mother   . Cancer Mother   . Depression Mother   . Obesity Mother   . Hypertension Other   . Colon cancer Neg Hx   . Colon polyps Neg Hx   . Esophageal cancer Neg Hx   . Rectal cancer Neg Hx   . Stomach cancer Neg Hx     ROS: Review of Systems  Constitutional: Positive for weight loss.  Gastrointestinal: Negative for nausea and vomiting.  Musculoskeletal:       Negative for muscle weakness  Endo/Heme/Allergies:       Positive  for hyperglycemia Negative for hypoglycemia    PHYSICAL EXAM: Blood pressure 100/68, pulse 72, temperature 98.4 F (36.9 C), height 6\' 3"  (1.905 m), weight 288 lb (130.6 kg), SpO2 97 %. Body mass index is 36 kg/m. Physical Exam  Constitutional: He is oriented to person, place, and time. He appears well-developed and well-nourished.  Cardiovascular: Normal rate.  Pulmonary/Chest: Effort normal.  Musculoskeletal: Normal range of motion.  Neurological: He is oriented to person, place, and time.  Skin: Skin is warm and dry.  Psychiatric: He has a normal mood and affect. His behavior is normal.  Vitals reviewed.   RECENT LABS AND TESTS: BMET    Component Value Date/Time   NA 137 07/10/2017 0901   K 4.3 07/10/2017 0901   CL 101 07/10/2017 0901   CO2 21 07/10/2017 0901   GLUCOSE 119 (H) 07/10/2017 0901   GLUCOSE 197 (H) 10/01/2016 1027   BUN 21 07/10/2017 0901   CREATININE 1.01 07/10/2017 0901   CALCIUM 9.5 07/10/2017 0901   GFRNONAA 83 07/10/2017 0901   GFRAA 96 07/10/2017 0901   Lab Results  Component Value Date   HGBA1C 5.7 (H) 07/10/2017   HGBA1C 5.2 04/03/2017   HGBA1C 6.0 (H) 12/27/2016   HGBA1C 7.4 (H) 10/01/2016   HGBA1C 7.7 (H) 06/29/2016   Lab Results  Component Value Date   INSULIN 84.2 (H) 07/10/2017   INSULIN 34.5 (H) 04/03/2017   INSULIN 53.0 (H) 12/27/2016   CBC    Component Value Date/Time   WBC 6.9 12/27/2016 1125   WBC 6.8 10/01/2016 1027   RBC 4.75 12/27/2016 1125   RBC 4.86 10/01/2016 1027   HGB 15.6 12/27/2016 1125   HCT 44.4 12/27/2016 1125   PLT 212 12/27/2016 1125   MCV 94 12/27/2016 1125   MCH 32.8 12/27/2016 1125   MCH 31.7 11/25/2014 0435   MCHC 35.1 12/27/2016 1125   MCHC 34.9 10/01/2016 1027   RDW 14.8 12/27/2016 1125   LYMPHSABS 2.3 12/27/2016 1125   MONOABS 0.4 10/01/2016 1027   EOSABS 0.1 12/27/2016 1125   BASOSABS 0.0 12/27/2016 1125   Iron/TIBC/Ferritin/ %Sat No results found for: IRON, TIBC, FERRITIN,  IRONPCTSAT Lipid Panel     Component Value Date/Time   CHOL 158 07/10/2017 0901   TRIG 244 (H) 07/10/2017 0901   HDL 34 (L) 07/10/2017 0901   CHOLHDL 4 10/01/2016 1027   VLDL 33.8 10/01/2016 1027   LDLCALC 75 07/10/2017 0901   LDLDIRECT 93.3 04/02/2014 1632   Hepatic Function Panel     Component Value Date/Time   PROT 6.8 07/10/2017 0901   ALBUMIN 4.2 07/10/2017 0901   AST 18 07/10/2017  0901   ALT 21 07/10/2017 0901   ALKPHOS 71 07/10/2017 0901   BILITOT 0.7 07/10/2017 0901   BILIDIR 0.2 10/01/2016 1027      Component Value Date/Time   TSH 3.180 12/27/2016 1125   TSH 2.60 10/01/2016 1027   TSH 3.26 06/29/2016 1205   Results for HILLARY, SCHWEGLER (MRN [id number]) as of 10/02/2017 14:19  Ref. Range 07/10/2017 09:01  Vitamin D, 25-Hydroxy Latest Ref Range: 30.0 - 100.0 ng/mL 25.6 (L)   ASSESSMENT AND PLAN: Controlled type 2 diabetes mellitus with hyperglycemia, without long-term current use of insulin (HCC)  Vitamin D deficiency - Plan: Vitamin D, Ergocalciferol, (DRISDOL) 50000 units CAPS capsule  At risk for osteoporosis  Class 2 severe obesity with serious comorbidity and body mass index (BMI) of 36.0 to 36.9 in adult, unspecified obesity type ([place])  Type 2 diabetes mellitus without complication, without long-term current use of insulin ([place]) - Plan: liraglutide (VICTOZA) 18 MG/3ML SOPN  PLAN:  Vitamin D Deficiency Amarion was informed that low vitamin D levels contributes to fatigue and are associated with obesity, breast, and colon cancer. He agrees to continue to take prescription Vit D @50 ,000 IU every week #4 with no refills and will follow up for routine testing of vitamin D, at least 2-3 times per year. He was informed of the risk of over-replacement of vitamin D and agrees to not increase his dose unless he discusses this with [place] first. Zuriel agrees to follow up with our clinic in 2 weeks.  At risk for osteopenia and osteoporosis Ameet is at risk for osteopenia  and osteoporosis due to his vitamin D deficiency. He was encouraged to take his vitamin D and follow his higher calcium diet and increase strengthening exercise to help strengthen his bones and decrease his risk of osteopenia and osteoporosis.  Diabetes II with hyperglycemia Breeze has been given extensive diabetes education by myself today including ideal fasting and post-prandial blood glucose readings, individual ideal Hgb A1c goals and hypoglycemia prevention. We discussed the importance of good blood sugar control to decrease the likelihood of diabetic complications such as nephropathy, neuropathy, limb loss, blindness, coronary artery disease, and death. We discussed the importance of intensive lifestyle modification including diet, exercise and weight loss as the first line treatment for diabetes. Loraine agrees to continue his diabetes medications, we will refill victoza #3 pens (patient at 1.8 mg) and follow up at the agreed upon time.  Obesity Nas is currently in the action stage of change. As such, his goal is to continue with weight loss efforts He has agreed to follow the Category 4 plan Makale has been instructed to work up to a goal of 150 minutes of combined cardio and strengthening exercise per week for weight loss and overall health benefits. We discussed the following Behavioral Modification Strategies today: increasing lean protein intake and work on meal planning and easy cooking plans  Prinston has agreed to follow up with our clinic in 2 weeks. He was informed of the importance of frequent follow up visits to maximize his success with intensive lifestyle modifications for his multiple health conditions.    OBESITY BEHAVIORAL INTERVENTION VISIT  Today's visit was # 19 out of 22.  Starting weight: 327 lbs Starting date: 12/27/16 Today's weight : 288 lbs  Today's date: 10/02/2017 Total lbs lost to date: 63 (Patients must lose 7 lbs in the first 6 months to continue with  counseling)   ASK: We discussed the diagnosis of obesity with Adrian Blackwater  Wilhemina Cash today and Zaeem agreed to give [place] permission to discuss obesity behavioral modification therapy today.  ASSESS: Daniell has the diagnosis of obesity and his BMI today is 71 Riaz is in the action stage of change   ADVISE: Garnett was educated on the multiple health risks of obesity as well as the benefit of weight loss to improve his health. He was advised of the need for long term treatment and the importance of lifestyle modifications.  AGREE: Multiple dietary modification options and treatment options were discussed and  Kiril agreed to the above obesity treatment plan.   Corey Skains, am acting as transcriptionist for [employer], PA-C I, Lacy Duverney [facility], have reviewed this note and agree with its content

## 2017-10-04 ENCOUNTER — Other Ambulatory Visit (INDEPENDENT_AMBULATORY_CARE_PROVIDER_SITE_OTHER): Payer: Self-pay | Admitting: Internal Medicine

## 2017-10-04 DIAGNOSIS — E119 Type 2 diabetes mellitus without complications: Secondary | ICD-10-CM

## 2017-10-08 DIAGNOSIS — M545 Low back pain: Secondary | ICD-10-CM | POA: Diagnosis not present

## 2017-10-08 DIAGNOSIS — M5136 Other intervertebral disc degeneration, lumbar region: Secondary | ICD-10-CM | POA: Diagnosis not present

## 2017-10-08 DIAGNOSIS — M4696 Unspecified inflammatory spondylopathy, lumbar region: Secondary | ICD-10-CM | POA: Diagnosis not present

## 2017-10-16 ENCOUNTER — Ambulatory Visit (INDEPENDENT_AMBULATORY_CARE_PROVIDER_SITE_OTHER): Payer: 59 | Admitting: Physician Assistant

## 2017-10-16 VITALS — BP 118/78 | HR 74 | Temp 97.8°F | Ht 75.0 in | Wt 287.0 lb

## 2017-10-16 DIAGNOSIS — Z6835 Body mass index (BMI) 35.0-35.9, adult: Secondary | ICD-10-CM | POA: Diagnosis not present

## 2017-10-16 DIAGNOSIS — M545 Low back pain: Secondary | ICD-10-CM | POA: Diagnosis not present

## 2017-10-16 DIAGNOSIS — E119 Type 2 diabetes mellitus without complications: Secondary | ICD-10-CM

## 2017-10-16 NOTE — Progress Notes (Signed)
Office: [phone]  /  Fax: [phone]   HPI:   Chief Complaint: OBESITY Joel Medina is here to discuss his progress with his obesity treatment plan. Joel Medina is on the Category 4 plan and is following his eating plan approximately 70 % of the time. Joel Medina states Joel Medina is walking for 60 minutes 7 times per week. Joel Medina continues to do well with weight loss. Joel Medina is mindful of his eating, although Joel Medina is not following the meal plan or keeping a strict food journal. Joel Medina is encouraged to increase his lean protein intake.  His weight is 287 lb (130.2 kg) today and has had a weight loss of 1 pound over a period of 2 weeks since his last visit. Joel Medina has lost 40 lbs since starting treatment with Joel Medina.  Diabetes II Joel Medina has a diagnosis of diabetes type II. Joel Medina states fasting BGs range in 130's and Joel Medina denies any hypoglycemic episodes. Joel Medina is on metformin and Victoza. Last A1c was 5.7. Joel Medina has been working on intensive lifestyle modifications including diet, exercise, and weight loss to help control his blood glucose levels.  ALLERGIES: Allergies  Allergen Reactions  . Amlodipine     Edema with 10 mg/d    MEDICATIONS: Current Outpatient Medications on File Prior to Visit  Medication Sig Dispense Refill  . BD PEN NEEDLE NANO U/F 32G X 4 MM MISC USE TO INJECT EVERY MORNING 100 each 3  . glucose blood test strip Use as instructed 100 each 0  . Lancets (ACCU-CHEK SAFE-T PRO) lancets Use as instructed 100 each 0  . liraglutide (VICTOZA) 18 MG/3ML SOPN Inject 0.3 mLs (1.8 mg total) into the skin every morning. 3 pen 0  . metFORMIN (GLUCOPHAGE) 1000 MG tablet TAKE 1 TABLET (1,000 MG TOTAL) BY MOUTH 2 (TWO) TIMES DAILY WITH A MEAL. 180 tablet 3  . telmisartan (MICARDIS) 80 MG tablet Take 1 tablet (80 mg total) by mouth daily. 90 tablet 3  . triamterene-hydrochlorothiazide (MAXZIDE-25) 37.5-25 MG tablet Take 1 tablet by mouth daily. 90 tablet 3  . Vitamin D, Ergocalciferol, (DRISDOL) 50000 units CAPS capsule Take 1  capsule (50,000 Units total) by mouth every 7 (seven) days. 4 capsule 0   No current facility-administered medications on file prior to visit.     PAST MEDICAL HISTORY: Past Medical History:  Diagnosis Date  . Arthritis    OA BOTH HIPS - PAIN IN LEFT HIP WORSE  . Back pain   . Diabetes mellitus without complication (HCC)    oral meds  . History of pyelonephritis   . HTN (hypertension)   . Joint pain   . Obesity   . Prediabetes   . Swelling    feet and legs    PAST SURGICAL HISTORY: Past Surgical History:  Procedure Laterality Date  . [place]  . RIGHT ULNAR NERVE SURGERY TO REMOVE SCAR TISSUE  ? 2006  . TOTAL HIP ARTHROPLASTY Left 02/04/2013   Procedure: LEFT TOTAL HIP ARTHROPLASTY;  Surgeon: [name], MD;  Location: [facility];  Service: Orthopedics;  Laterality: Left;  . TOTAL HIP ARTHROPLASTY Right 11/24/2014   Procedure: RIGHT TOTAL HIP ARTHROPLASTY ANTERIOR APPROACH;  Surgeon: Gaynelle Arabian, MD;  Location: [facility];  Service: Orthopedics;  Laterality: Right;  . WISDOM TEETH EXTRACTIONS  ? 53  . WISDOM TOOTH EXTRACTION      SOCIAL HISTORY: Social History   Tobacco Use  . Smoking status: Never Smoker  . Smokeless tobacco: Never Used  Substance Use Topics  .  Alcohol use: Yes    Alcohol/week: 7.2 oz    Types: 12 Cans of beer per week  . Drug use: No    FAMILY HISTORY: Family History  Problem Relation Age of Onset  . Hypertension Father   . Obesity Father   . Hypertension Mother   . Cancer Mother   . Depression Mother   . Obesity Mother   . Hypertension Other   . Colon cancer Neg Hx   . Colon polyps Neg Hx   . Esophageal cancer Neg Hx   . Rectal cancer Neg Hx   . Stomach cancer Neg Hx     ROS: Review of Systems  Constitutional: Positive for weight loss.  Endo/Heme/Allergies:       Negative hypoglycemia    PHYSICAL EXAM: Blood pressure 118/78, pulse 74, temperature 97.8 F (36.6 C), temperature source Oral, height 6\' 3"  (1.905  m), weight 287 lb (130.2 kg), SpO2 97 %. Body mass index is 35.87 kg/m. Physical Exam  Constitutional: Joel Medina is oriented to person, place, and time. Joel Medina appears well-developed and well-nourished.  Cardiovascular: Normal rate.  Pulmonary/Chest: Effort normal.  Musculoskeletal: Normal range of motion.  Neurological: Joel Medina is oriented to person, place, and time.  Skin: Skin is warm and dry.  Psychiatric: Joel Medina has a normal mood and affect. His behavior is normal.  Vitals reviewed.   RECENT LABS AND TESTS: BMET    Component Value Date/Time   NA 137 07/10/2017 0901   K 4.3 07/10/2017 0901   CL 101 07/10/2017 0901   CO2 21 07/10/2017 0901   GLUCOSE 119 (H) 07/10/2017 0901   GLUCOSE 197 (H) 10/01/2016 1027   BUN 21 07/10/2017 0901   CREATININE 1.01 07/10/2017 0901   CALCIUM 9.5 07/10/2017 0901   GFRNONAA 83 07/10/2017 0901   GFRAA 96 07/10/2017 0901   Lab Results  Component Value Date   HGBA1C 5.7 (H) 07/10/2017   HGBA1C 5.2 04/03/2017   HGBA1C 6.0 (H) 12/27/2016   HGBA1C 7.4 (H) 10/01/2016   HGBA1C 7.7 (H) 06/29/2016   Lab Results  Component Value Date   INSULIN 84.2 (H) 07/10/2017   INSULIN 34.5 (H) 04/03/2017   INSULIN 53.0 (H) 12/27/2016   CBC    Component Value Date/Time   WBC 6.9 12/27/2016 1125   WBC 6.8 10/01/2016 1027   RBC 4.75 12/27/2016 1125   RBC 4.86 10/01/2016 1027   HGB 15.6 12/27/2016 1125   HCT 44.4 12/27/2016 1125   PLT 212 12/27/2016 1125   MCV 94 12/27/2016 1125   MCH 32.8 12/27/2016 1125   MCH 31.7 11/25/2014 0435   MCHC 35.1 12/27/2016 1125   MCHC 34.9 10/01/2016 1027   RDW 14.8 12/27/2016 1125   LYMPHSABS 2.3 12/27/2016 1125   MONOABS 0.4 10/01/2016 1027   EOSABS 0.1 12/27/2016 1125   BASOSABS 0.0 12/27/2016 1125   Iron/TIBC/Ferritin/ %Sat No results found for: IRON, TIBC, FERRITIN, IRONPCTSAT Lipid Panel     Component Value Date/Time   CHOL 158 07/10/2017 0901   TRIG 244 (H) 07/10/2017 0901   HDL 34 (L) 07/10/2017 0901   CHOLHDL 4  10/01/2016 1027   VLDL 33.8 10/01/2016 1027   LDLCALC 75 07/10/2017 0901   LDLDIRECT 93.3 04/02/2014 1632   Hepatic Function Panel     Component Value Date/Time   PROT 6.8 07/10/2017 0901   ALBUMIN 4.2 07/10/2017 0901   AST 18 07/10/2017 0901   ALT 21 07/10/2017 0901   ALKPHOS 71 07/10/2017 0901   BILITOT 0.7 07/10/2017  0901   BILIDIR 0.2 10/01/2016 1027      Component Value Date/Time   TSH 3.180 12/27/2016 1125   TSH 2.60 10/01/2016 1027   TSH 3.26 06/29/2016 1205    ASSESSMENT AND PLAN: Vitamin D deficiency  Type 2 diabetes mellitus without complication, without long-term current use of insulin (HCC)  Class 2 severe obesity with serious comorbidity and body mass index (BMI) of 35.0 to 35.9 in adult, unspecified obesity type ([place])  PLAN:  Diabetes II Joel Medina has been given extensive diabetes education by myself today including ideal fasting and post-prandial blood glucose readings, individual ideal Hgb A1c goals and hypoglycemia prevention. We discussed the importance of good blood sugar control to decrease the likelihood of diabetic complications such as nephropathy, neuropathy, limb loss, blindness, coronary artery disease, and death. We discussed the importance of intensive lifestyle modification including diet, exercise and weight loss as the first line treatment for diabetes. Joel Medina agrees to continue his diabetes medications and Joel Medina agrees to follow up with our clinic in 2 weeks.  We spent > than 50% of the 15 minute visit on the counseling as documented in the note.  Obesity Joel Medina is currently in the action stage of change. As such, his goal is to continue with weight loss efforts Joel Medina has agreed to portion control better and make smarter food choices, such as increase vegetables and decrease simple carbohydrates  Joel Medina has been instructed to work up to a goal of 150 minutes of combined cardio and strengthening exercise per week for weight loss and overall health  benefits. We discussed the following Behavioral Modification Strategies today: increasing lean protein intake and better snacking choices   Nyzir has agreed to follow up with our clinic in 2 weeks. Joel Medina was informed of the importance of frequent follow up visits to maximize his success with intensive lifestyle modifications for his multiple health conditions.    Today's visit was # 20 out of 22.  Starting weight: 327 lbs Starting date: 12/27/16 Today's weight : 287 lbs Today's date: 10/16/2017 Total lbs lost to date: 24 (Patients must lose 7 lbs in the first 6 months to continue with counseling)   ASK: We discussed the diagnosis of obesity with Joel Medina today and Joel Medina agreed to give Joel Medina permission to discuss obesity behavioral modification therapy today.  ASSESS: Joel Medina has the diagnosis of obesity and his BMI today is 35.87 Joel Medina is in the action stage of change   ADVISE: Taurean was educated on the multiple health risks of obesity as well as the benefit of weight loss to improve his health. Joel Medina was advised of the need for long term treatment and the importance of lifestyle modifications.  AGREE: Multiple dietary modification options and treatment options were discussed and  Joel Medina agreed to the above obesity treatment plan.   Joel Medina, am acting as transcriptionist for Lacy Duverney, PA-C I, Lacy Duverney [facility], have reviewed this note and agree with its content

## 2017-10-25 DIAGNOSIS — M545 Low back pain: Secondary | ICD-10-CM | POA: Diagnosis not present

## 2017-10-29 DIAGNOSIS — M545 Low back pain: Secondary | ICD-10-CM | POA: Diagnosis not present

## 2017-10-31 ENCOUNTER — Encounter (INDEPENDENT_AMBULATORY_CARE_PROVIDER_SITE_OTHER): Payer: Self-pay | Admitting: Physician Assistant

## 2017-10-31 ENCOUNTER — Ambulatory Visit (INDEPENDENT_AMBULATORY_CARE_PROVIDER_SITE_OTHER): Payer: 59 | Admitting: Physician Assistant

## 2017-10-31 VITALS — BP 109/76 | HR 77 | Temp 97.9°F | Ht 75.0 in | Wt 283.0 lb

## 2017-10-31 DIAGNOSIS — E119 Type 2 diabetes mellitus without complications: Secondary | ICD-10-CM

## 2017-10-31 DIAGNOSIS — E559 Vitamin D deficiency, unspecified: Secondary | ICD-10-CM

## 2017-10-31 DIAGNOSIS — Z9189 Other specified personal risk factors, not elsewhere classified: Secondary | ICD-10-CM

## 2017-10-31 DIAGNOSIS — Z6835 Body mass index (BMI) 35.0-35.9, adult: Secondary | ICD-10-CM

## 2017-10-31 MED ORDER — LIRAGLUTIDE 18 MG/3ML ~~LOC~~ SOPN: 1.8000 mg | PEN_INJECTOR | Freq: Every morning | SUBCUTANEOUS | 0 refills | Status: DC

## 2017-10-31 MED ORDER — VITAMIN D (ERGOCALCIFEROL) 1.25 MG (50000 UNIT) PO CAPS: 50000.0000 [IU] | ORAL_CAPSULE | ORAL | 0 refills | Status: DC

## 2017-10-31 NOTE — Progress Notes (Signed)
Office: [phone]  /  Fax: [phone]   HPI:   Chief Complaint: OBESITY Joel Medina is here to discuss his progress with his obesity treatment plan. He is on the portion control better and make smarter food choices, such as increase vegetables and decrease simple carbohydrates and is following his eating plan approximately 75 % of the time. He states he is walking for 60 minutes 7 times per week. Joel Medina continues to well with weight loss. He is working on portion controlling at his dinner meals.  His weight is 283 lb (128.4 kg) today and has had a weight loss of 4 pounds over a period of 2 weeks since his last visit. He has lost 44 lbs since starting treatment with [place].  Diabetes II Joel Medina has a diagnosis of diabetes type II. Brandom states fasting BGs range between 120 and 140's, he is not checking post prandial. He denies hypoglycemia, and he is on Vitoza 1.8 mg. Last A1c was 5.7 on 07/10/17. He has been working on intensive lifestyle modifications including diet, exercise, and weight loss to help control his blood glucose levels.  At risk for cardiovascular disease Joel Medina is at a higher than average risk for cardiovascular disease due to obesity and diabetes II. He currently denies any chest pain.  Vitamin D Deficiency Joel Medina has a diagnosis of vitamin D deficiency. He is currently taking prescription Vit D and denies nausea, vomiting or muscle weakness.  ALLERGIES: Allergies  Allergen Reactions  . Amlodipine     Edema with 10 mg/d    MEDICATIONS: Current Outpatient Medications on File Prior to Visit  Medication Sig Dispense Refill  . BD PEN NEEDLE NANO U/F 32G X 4 MM MISC USE TO INJECT EVERY MORNING 100 each 3  . glucose blood test strip Use as instructed 100 each 0  . Lancets (ACCU-CHEK SAFE-T PRO) lancets Use as instructed 100 each 0  . metFORMIN (GLUCOPHAGE) 1000 MG tablet TAKE 1 TABLET (1,000 MG TOTAL) BY MOUTH 2 (TWO) TIMES DAILY WITH A MEAL. 180 tablet 3  . telmisartan  (MICARDIS) 80 MG tablet Take 1 tablet (80 mg total) by mouth daily. 90 tablet 3  . triamterene-hydrochlorothiazide (MAXZIDE-25) 37.5-25 MG tablet Take 1 tablet by mouth daily. 90 tablet 3   No current facility-administered medications on file prior to visit.     PAST MEDICAL HISTORY: Past Medical History:  Diagnosis Date  . Arthritis    OA BOTH HIPS - PAIN IN LEFT HIP WORSE  . Back pain   . Diabetes mellitus without complication (HCC)    oral meds  . History of pyelonephritis   . HTN (hypertension)   . Joint pain   . Obesity   . Prediabetes   . Swelling    feet and legs    PAST SURGICAL HISTORY: Past Surgical History:  Procedure Laterality Date  . [place]  . RIGHT ULNAR NERVE SURGERY TO REMOVE SCAR TISSUE  ? 2006  . TOTAL HIP ARTHROPLASTY Left 02/04/2013   Procedure: LEFT TOTAL HIP ARTHROPLASTY;  Surgeon: [name], MD;  Location: [facility];  Service: Orthopedics;  Laterality: Left;  . TOTAL HIP ARTHROPLASTY Right 11/24/2014   Procedure: RIGHT TOTAL HIP ARTHROPLASTY ANTERIOR APPROACH;  Surgeon: Gaynelle Arabian, MD;  Location: [facility];  Service: Orthopedics;  Laterality: Right;  . WISDOM TEETH EXTRACTIONS  ? 59  . WISDOM TOOTH EXTRACTION      SOCIAL HISTORY: Social History   Tobacco Use  . Smoking status: Never Smoker  .  Smokeless tobacco: Never Used  Substance Use Topics  . Alcohol use: Yes    Alcohol/week: 7.2 oz    Types: 12 Cans of beer per week  . Drug use: No    FAMILY HISTORY: Family History  Problem Relation Age of Onset  . Hypertension Father   . Obesity Father   . Hypertension Mother   . Cancer Mother   . Depression Mother   . Obesity Mother   . Hypertension Other   . Colon cancer Neg Hx   . Colon polyps Neg Hx   . Esophageal cancer Neg Hx   . Rectal cancer Neg Hx   . Stomach cancer Neg Hx     ROS: Review of Systems  Constitutional: Positive for weight loss.  Cardiovascular: Negative for chest pain.  Gastrointestinal:  Negative for nausea and vomiting.  Musculoskeletal:       Negative muscle weakness  Endo/Heme/Allergies:       Negative hypoglycemia    PHYSICAL EXAM: Blood pressure 109/76, pulse 77, temperature 97.9 F (36.6 C), temperature source Oral, height 6\' 3"  (1.905 m), weight 283 lb (128.4 kg), SpO2 99 %. Body mass index is 35.37 kg/m. Physical Exam  Constitutional: He is oriented to person, place, and time. He appears well-developed and well-nourished.  Cardiovascular: Normal rate.  Pulmonary/Chest: Effort normal.  Musculoskeletal: Normal range of motion.  Neurological: He is oriented to person, place, and time.  Skin: Skin is warm and dry.  Psychiatric: He has a normal mood and affect. His behavior is normal.  Vitals reviewed.   RECENT LABS AND TESTS: BMET    Component Value Date/Time   NA 137 07/10/2017 0901   K 4.3 07/10/2017 0901   CL 101 07/10/2017 0901   CO2 21 07/10/2017 0901   GLUCOSE 119 (H) 07/10/2017 0901   GLUCOSE 197 (H) 10/01/2016 1027   BUN 21 07/10/2017 0901   CREATININE 1.01 07/10/2017 0901   CALCIUM 9.5 07/10/2017 0901   GFRNONAA 83 07/10/2017 0901   GFRAA 96 07/10/2017 0901   Lab Results  Component Value Date   HGBA1C 5.7 (H) 07/10/2017   HGBA1C 5.2 04/03/2017   HGBA1C 6.0 (H) 12/27/2016   HGBA1C 7.4 (H) 10/01/2016   HGBA1C 7.7 (H) 06/29/2016   Lab Results  Component Value Date   INSULIN 84.2 (H) 07/10/2017   INSULIN 34.5 (H) 04/03/2017   INSULIN 53.0 (H) 12/27/2016   CBC    Component Value Date/Time   WBC 6.9 12/27/2016 1125   WBC 6.8 10/01/2016 1027   RBC 4.75 12/27/2016 1125   RBC 4.86 10/01/2016 1027   HGB 15.6 12/27/2016 1125   HCT 44.4 12/27/2016 1125   PLT 212 12/27/2016 1125   MCV 94 12/27/2016 1125   MCH 32.8 12/27/2016 1125   MCH 31.7 11/25/2014 0435   MCHC 35.1 12/27/2016 1125   MCHC 34.9 10/01/2016 1027   RDW 14.8 12/27/2016 1125   LYMPHSABS 2.3 12/27/2016 1125   MONOABS 0.4 10/01/2016 1027   EOSABS 0.1 12/27/2016 1125    BASOSABS 0.0 12/27/2016 1125   Iron/TIBC/Ferritin/ %Sat No results found for: IRON, TIBC, FERRITIN, IRONPCTSAT Lipid Panel     Component Value Date/Time   CHOL 158 07/10/2017 0901   TRIG 244 (H) 07/10/2017 0901   HDL 34 (L) 07/10/2017 0901   CHOLHDL 4 10/01/2016 1027   VLDL 33.8 10/01/2016 1027   LDLCALC 75 07/10/2017 0901   LDLDIRECT 93.3 04/02/2014 1632   Hepatic Function Panel     Component Value Date/Time  PROT 6.8 07/10/2017 0901   ALBUMIN 4.2 07/10/2017 0901   AST 18 07/10/2017 0901   ALT 21 07/10/2017 0901   ALKPHOS 71 07/10/2017 0901   BILITOT 0.7 07/10/2017 0901   BILIDIR 0.2 10/01/2016 1027      Component Value Date/Time   TSH 3.180 12/27/2016 1125   TSH 2.60 10/01/2016 1027   TSH 3.26 06/29/2016 1205  Results for Joel Medina, Joel Medina (MRN [id number]) as of 10/31/2017 13:16  Ref. Range 07/10/2017 09:01  Vitamin D, 25-Hydroxy Latest Ref Range: 30.0 - 100.0 ng/mL 25.6 (L)    ASSESSMENT AND PLAN: Vitamin D deficiency - Plan: Vitamin D, Ergocalciferol, (DRISDOL) 50000 units CAPS capsule  Type 2 diabetes mellitus without complication, without long-term current use of insulin (HCC) - Plan: liraglutide (VICTOZA) 18 MG/3ML SOPN  At risk for heart disease  Class 2 severe obesity with serious comorbidity and body mass index (BMI) of 35.0 to 35.9 in adult, unspecified obesity type ([place])  PLAN:  Diabetes II Lajarvis has been given extensive diabetes education by myself today including ideal fasting and post-prandial blood glucose readings, individual ideal Hgb A1c goals and hypoglycemia prevention. We discussed the importance of good blood sugar control to decrease the likelihood of diabetic complications such as nephropathy, neuropathy, limb loss, blindness, coronary artery disease, and death. We discussed the importance of intensive lifestyle modification including diet, exercise and weight loss as the first line treatment for diabetes. Antiono agrees to continue his diabetes  medications and he agrees to follow up with our clinic in 3 weeks.  Cardiovascular risk counselling Joel Medina was given extended (15 minutes) coronary artery disease prevention counseling today. He is 57 y.o. male and has risk factors for heart disease including obesity and diabetes II. We discussed intensive lifestyle modifications today with an emphasis on specific weight loss instructions and strategies. Pt was also informed of the importance of increasing exercise and decreasing saturated fats to help prevent heart disease.  Vitamin D Deficiency Joel Medina was informed that low vitamin D levels contributes to fatigue and are associated with obesity, breast, and colon cancer. Joel Medina agrees to continue taking prescription Vit D @50 ,000 IU every week #4 and we will refill for 1 month. He will follow up for routine testing of vitamin D, at least 2-3 times per year. He was informed of the risk of over-replacement of vitamin D and agrees to not increase his dose unless he discusses this with [place] first. Joel Medina agrees to follow up with our clinic in 3 weeks.  Obesity Joel Medina is currently in the action stage of change. As such, his goal is to continue with weight loss efforts He has agreed to portion control better and make smarter food choices, such as increase vegetables and decrease simple carbohydrates  Joel Medina has been instructed to work up to a goal of 150 minutes of combined cardio and strengthening exercise per week for weight loss and overall health benefits. We discussed the following Behavioral Modification Strategies today: work on meal planning and easy cooking plans and decrease junk food   Joel Medina has agreed to follow up with our clinic in 3 weeks. He was informed of the importance of frequent follow up visits to maximize his success with intensive lifestyle modifications for his multiple health conditions.   OBESITY BEHAVIORAL INTERVENTION VISIT  Today's visit was # 21 out of 22.  Starting  weight: 327 lbs Starting date: 12/27/16 Today's weight : 283 lbs Today's date: 10/31/2017 Total lbs lost to date: 41 (Patients must lose 7  lbs in the first 6 months to continue with counseling)   ASK: We discussed the diagnosis of obesity with Joel Medina today and Joel Medina agreed to give [place] permission to discuss obesity behavioral modification therapy today.  ASSESS: Joel Medina has the diagnosis of obesity and his BMI today is 35.37 Joel Medina is in the action stage of change   ADVISE: Joel Medina was educated on the multiple health risks of obesity as well as the benefit of weight loss to improve his health. He was advised of the need for long term treatment and the importance of lifestyle modifications.  AGREE: Multiple dietary modification options and treatment options were discussed and  Joel Medina agreed to the above obesity treatment plan.   Joel Medina, am acting as transcriptionist for Lacy Duverney, PA-C I, Lacy Duverney [facility], have reviewed this note and agree with its content

## 2017-11-01 ENCOUNTER — Encounter (INDEPENDENT_AMBULATORY_CARE_PROVIDER_SITE_OTHER): Payer: Self-pay | Admitting: Physician Assistant

## 2017-11-01 DIAGNOSIS — M545 Low back pain: Secondary | ICD-10-CM | POA: Diagnosis not present

## 2017-11-04 DIAGNOSIS — M545 Low back pain: Secondary | ICD-10-CM | POA: Diagnosis not present

## 2017-11-04 NOTE — Telephone Encounter (Signed)
Refilled on 5/16. Please look into it

## 2017-11-08 DIAGNOSIS — M545 Low back pain: Secondary | ICD-10-CM | POA: Diagnosis not present

## 2017-11-12 DIAGNOSIS — M545 Low back pain: Secondary | ICD-10-CM | POA: Diagnosis not present

## 2017-11-15 DIAGNOSIS — M545 Low back pain: Secondary | ICD-10-CM | POA: Diagnosis not present

## 2017-11-18 DIAGNOSIS — M545 Low back pain: Secondary | ICD-10-CM | POA: Diagnosis not present

## 2017-11-21 DIAGNOSIS — M545 Low back pain: Secondary | ICD-10-CM | POA: Diagnosis not present

## 2017-11-25 ENCOUNTER — Encounter (INDEPENDENT_AMBULATORY_CARE_PROVIDER_SITE_OTHER): Payer: Self-pay | Admitting: Physician Assistant

## 2017-11-25 ENCOUNTER — Ambulatory Visit (INDEPENDENT_AMBULATORY_CARE_PROVIDER_SITE_OTHER): Payer: 59 | Admitting: Physician Assistant

## 2017-11-25 VITALS — BP 100/68 | HR 88 | Temp 98.7°F | Ht 75.0 in | Wt 291.0 lb

## 2017-11-25 DIAGNOSIS — Z9189 Other specified personal risk factors, not elsewhere classified: Secondary | ICD-10-CM

## 2017-11-25 DIAGNOSIS — E559 Vitamin D deficiency, unspecified: Secondary | ICD-10-CM | POA: Diagnosis not present

## 2017-11-25 DIAGNOSIS — E119 Type 2 diabetes mellitus without complications: Secondary | ICD-10-CM | POA: Diagnosis not present

## 2017-11-25 DIAGNOSIS — Z6836 Body mass index (BMI) 36.0-36.9, adult: Secondary | ICD-10-CM | POA: Diagnosis not present

## 2017-11-25 MED ORDER — METFORMIN HCL 1000 MG PO TABS: ORAL_TABLET | ORAL | 0 refills | Status: DC

## 2017-11-25 MED ORDER — LIRAGLUTIDE 18 MG/3ML ~~LOC~~ SOPN: 1.8000 mg | PEN_INJECTOR | Freq: Every morning | SUBCUTANEOUS | 0 refills | Status: DC

## 2017-11-25 MED ORDER — VITAMIN D (ERGOCALCIFEROL) 1.25 MG (50000 UNIT) PO CAPS: 50000.0000 [IU] | ORAL_CAPSULE | ORAL | 0 refills | Status: DC

## 2017-11-25 NOTE — Progress Notes (Signed)
Office: [phone]  /  Fax: [phone]   HPI:   Chief Complaint: OBESITY Joel Medina is here to discuss his progress with his obesity treatment plan. He is on the portion control better and make smarter food choices plan and is following his eating plan approximately 70 % of the time. He states he is walking for 45 minutes 7 times per week. Joel Medina states he skips some of his meal, and eats less on occasion. This has caused him to be hungry, and he has had an increase in cravings. His weight is 291 lb (132 kg) today and has had a weight gain of 8 pounds over a period of 3 weeks since his last visit. He has lost 36 lbs since starting treatment with [place].  Diabetes II without complications, non insulin Joel Medina has a diagnosis of diabetes type II. Joel Medina states fasting BGs range between 130's and 140s. He is not checking his blood sugar post prandial. Joel Medina denies any hypoglycemic episodes. Last A1c was at 5.7 Joel Medina is on Victoza and Metformin currently. He has been working on intensive lifestyle modifications including diet, exercise, and weight loss to help control his blood glucose levels.  At risk for cardiovascular disease Joel Medina is at a higher than average risk for cardiovascular disease due to obesity and diabetes. He currently denies any chest pain.  Vitamin D deficiency Joel Medina has a diagnosis of vitamin D deficiency. He is currently taking vit D and denies nausea, vomiting or muscle weakness.  ALLERGIES: Allergies  Allergen Reactions  . Amlodipine     Edema with 10 mg/d    MEDICATIONS: Current Outpatient Medications on File Prior to Visit  Medication Sig Dispense Refill  . BD PEN NEEDLE NANO U/F 32G X 4 MM MISC USE TO INJECT EVERY MORNING 100 each 3  . glucose blood test strip Use as instructed 100 each 0  . Lancets (ACCU-CHEK SAFE-T PRO) lancets Use as instructed 100 each 0  . liraglutide (VICTOZA) 18 MG/3ML SOPN Inject 0.3 mLs (1.8 mg total) into the skin every morning. 3 pen  0  . metFORMIN (GLUCOPHAGE) 1000 MG tablet TAKE 1 TABLET (1,000 MG TOTAL) BY MOUTH 2 (TWO) TIMES DAILY WITH A MEAL. 180 tablet 3  . telmisartan (MICARDIS) 80 MG tablet Take 1 tablet (80 mg total) by mouth daily. 90 tablet 3  . triamterene-hydrochlorothiazide (MAXZIDE-25) 37.5-25 MG tablet Take 1 tablet by mouth daily. 90 tablet 3  . Vitamin D, Ergocalciferol, (DRISDOL) 50000 units CAPS capsule Take 1 capsule (50,000 Units total) by mouth every 7 (seven) days. 4 capsule 0   No current facility-administered medications on file prior to visit.     PAST MEDICAL HISTORY: Past Medical History:  Diagnosis Date  . Arthritis    OA BOTH HIPS - PAIN IN LEFT HIP WORSE  . Back pain   . Diabetes mellitus without complication (HCC)    oral meds  . History of pyelonephritis   . HTN (hypertension)   . Joint pain   . Obesity   . Prediabetes   . Swelling    feet and legs    PAST SURGICAL HISTORY: Past Surgical History:  Procedure Laterality Date  . [place]  . RIGHT ULNAR NERVE SURGERY TO REMOVE SCAR TISSUE  ? 2006  . TOTAL HIP ARTHROPLASTY Left 02/04/2013   Procedure: LEFT TOTAL HIP ARTHROPLASTY;  Surgeon: [name], MD;  Location: [facility];  Service: Orthopedics;  Laterality: Left;  . TOTAL HIP ARTHROPLASTY Right 11/24/2014  Procedure: RIGHT TOTAL HIP ARTHROPLASTY ANTERIOR APPROACH;  Surgeon: Gaynelle Arabian, MD;  Location: [facility];  Service: Orthopedics;  Laterality: Right;  . WISDOM TEETH EXTRACTIONS  ? 49  . WISDOM TOOTH EXTRACTION      SOCIAL HISTORY: Social History   Tobacco Use  . Smoking status: Never Smoker  . Smokeless tobacco: Never Used  Substance Use Topics  . Alcohol use: Yes    Alcohol/week: 7.2 oz    Types: 12 Cans of beer per week  . Drug use: No    FAMILY HISTORY: Family History  Problem Relation Age of Onset  . Hypertension Father   . Obesity Father   . Hypertension Mother   . Cancer Mother   . Depression Mother   . Obesity Mother   .  Hypertension Other   . Colon cancer Neg Hx   . Colon polyps Neg Hx   . Esophageal cancer Neg Hx   . Rectal cancer Neg Hx   . Stomach cancer Neg Hx     ROS: Review of Systems  Constitutional: Negative for weight loss.  Cardiovascular: Negative for chest pain.  Gastrointestinal: Negative for nausea and vomiting.  Musculoskeletal:       Negative for muscle weakness  Endo/Heme/Allergies:       Negative for hypoglycemia    PHYSICAL EXAM: Blood pressure 100/68, pulse 88, temperature 98.7 F (37.1 C), temperature source Oral, height 6\' 3"  (1.905 m), weight 291 lb (132 kg), SpO2 97 %. Body mass index is 36.37 kg/m. Physical Exam  Constitutional: He is oriented to person, place, and time. He appears well-developed and well-nourished.  Cardiovascular: Normal rate.  Pulmonary/Chest: Effort normal.  Musculoskeletal: Normal range of motion.  Neurological: He is oriented to person, place, and time.  Skin: Skin is warm and dry.  Psychiatric: He has a normal mood and affect. His behavior is normal.  Vitals reviewed.   RECENT LABS AND TESTS: BMET    Component Value Date/Time   NA 137 07/10/2017 0901   K 4.3 07/10/2017 0901   CL 101 07/10/2017 0901   CO2 21 07/10/2017 0901   GLUCOSE 119 (H) 07/10/2017 0901   GLUCOSE 197 (H) 10/01/2016 1027   BUN 21 07/10/2017 0901   CREATININE 1.01 07/10/2017 0901   CALCIUM 9.5 07/10/2017 0901   GFRNONAA 83 07/10/2017 0901   GFRAA 96 07/10/2017 0901   Lab Results  Component Value Date   HGBA1C 5.7 (H) 07/10/2017   HGBA1C 5.2 04/03/2017   HGBA1C 6.0 (H) 12/27/2016   HGBA1C 7.4 (H) 10/01/2016   HGBA1C 7.7 (H) 06/29/2016   Lab Results  Component Value Date   INSULIN 84.2 (H) 07/10/2017   INSULIN 34.5 (H) 04/03/2017   INSULIN 53.0 (H) 12/27/2016   CBC    Component Value Date/Time   WBC 6.9 12/27/2016 1125   WBC 6.8 10/01/2016 1027   RBC 4.75 12/27/2016 1125   RBC 4.86 10/01/2016 1027   HGB 15.6 12/27/2016 1125   HCT 44.4 12/27/2016  1125   PLT 212 12/27/2016 1125   MCV 94 12/27/2016 1125   MCH 32.8 12/27/2016 1125   MCH 31.7 11/25/2014 0435   MCHC 35.1 12/27/2016 1125   MCHC 34.9 10/01/2016 1027   RDW 14.8 12/27/2016 1125   LYMPHSABS 2.3 12/27/2016 1125   MONOABS 0.4 10/01/2016 1027   EOSABS 0.1 12/27/2016 1125   BASOSABS 0.0 12/27/2016 1125   Iron/TIBC/Ferritin/ %Sat No results found for: IRON, TIBC, FERRITIN, IRONPCTSAT Lipid Panel     Component Value  Date/Time   CHOL 158 07/10/2017 0901   TRIG 244 (H) 07/10/2017 0901   HDL 34 (L) 07/10/2017 0901   CHOLHDL 4 10/01/2016 1027   VLDL 33.8 10/01/2016 1027   LDLCALC 75 07/10/2017 0901   LDLDIRECT 93.3 04/02/2014 1632   Hepatic Function Panel     Component Value Date/Time   PROT 6.8 07/10/2017 0901   ALBUMIN 4.2 07/10/2017 0901   AST 18 07/10/2017 0901   ALT 21 07/10/2017 0901   ALKPHOS 71 07/10/2017 0901   BILITOT 0.7 07/10/2017 0901   BILIDIR 0.2 10/01/2016 1027      Component Value Date/Time   TSH 3.180 12/27/2016 1125   TSH 2.60 10/01/2016 1027   TSH 3.26 06/29/2016 1205   Results for KONSTANTINE, GERVASI (MRN [id number]) as of 11/25/2017 12:00  Ref. Range 07/10/2017 09:01  Vitamin D, 25-Hydroxy Latest Ref Range: 30.0 - 100.0 ng/mL 25.6 (L)   ASSESSMENT AND PLAN: Type 2 diabetes mellitus without complication, without long-term current use of insulin (HCC) - Plan: liraglutide (VICTOZA) 18 MG/3ML SOPN, metFORMIN (GLUCOPHAGE) 1000 MG tablet  Vitamin D deficiency - Plan: Vitamin D, Ergocalciferol, (DRISDOL) 50000 units CAPS capsule  At risk for heart disease  Class 2 severe obesity with serious comorbidity and body mass index (BMI) of 36.0 to 36.9 in adult, unspecified obesity type ([place])  PLAN:  Diabetes II without complications, non insulin Nabor has been given extensive diabetes education by myself today including ideal fasting and post-prandial blood glucose readings, individual ideal Hgb A1c goals and hypoglycemia prevention. We discussed the  importance of good blood sugar control to decrease the likelihood of diabetic complications such as nephropathy, neuropathy, limb loss, blindness, coronary artery disease, and death. We discussed the importance of intensive lifestyle modification including diet, exercise and weight loss as the first line treatment for diabetes. Domenic agrees to continue Victoza 1.8 mg and we will refill for 3 pens. Doniven agrees to continue Metformin 1,000 mg BID #60 with no refills and follow up at the agreed upon time.  Cardiovascular risk counseling Demaris was given extended (15 minutes) coronary artery disease prevention counseling today. He is 57 y.o. male and has risk factors for heart disease including obesity and diabetes. We discussed intensive lifestyle modifications today with an emphasis on specific weight loss instructions and strategies. Pt was also informed of the importance of increasing exercise and decreasing saturated fats to help prevent heart disease.  Vitamin D Deficiency Corin was informed that low vitamin D levels contributes to fatigue and are associated with obesity, breast, and colon cancer. He agrees to continue to take prescription Vit D @50 ,000 IU every week #4 with no refills and will follow up for routine testing of vitamin D, at least 2-3 times per year. He was informed of the risk of over-replacement of vitamin D and agrees to not increase his dose unless he discusses this with [place] first. Glenard agrees to follow up as directed.  Obesity Marlyn is currently in the action stage of change. As such, his goal is to continue with weight loss efforts He has agreed to follow the Category 4 plan Marshawn has been instructed to work up to a goal of 150 minutes of combined cardio and strengthening exercise per week for weight loss and overall health benefits. We discussed the following Behavioral Modification Strategies today: increasing lean protein intake and planning for success  Lankford has  agreed to follow up with our clinic in 2 weeks. He was informed of the importance of  frequent follow up visits to maximize his success with intensive lifestyle modifications for his multiple health conditions.    OBESITY BEHAVIORAL INTERVENTION VISIT  Today's visit was # 22 out of 22.  Starting weight: 327 lbs Starting date: 12/27/16 Today's weight :291 lbs Today's date: 11/25/2017 Total lbs lost to date: 75 (Patients must lose 7 lbs in the first 6 months to continue with counseling)   ASK: We discussed the diagnosis of obesity with [name] today and Early agreed to give [place] permission to discuss obesity behavioral modification therapy today.  ASSESS: Jaceyon has the diagnosis of obesity and his BMI today is 36.37 Mahamud is in the action stage of change   ADVISE: Kingsten was educated on the multiple health risks of obesity as well as the benefit of weight loss to improve his health. He was advised of the need for long term treatment and the importance of lifestyle modifications.  AGREE: Multiple dietary modification options and treatment options were discussed and  Kentravious agreed to the above obesity treatment plan.   Corey Skains, am acting as transcriptionist for [employer], PA-C I, Lacy Duverney [facility], have reviewed this note and agree with its content

## 2017-12-09 ENCOUNTER — Ambulatory Visit (INDEPENDENT_AMBULATORY_CARE_PROVIDER_SITE_OTHER): Payer: 59 | Admitting: Physician Assistant

## 2017-12-09 ENCOUNTER — Encounter (INDEPENDENT_AMBULATORY_CARE_PROVIDER_SITE_OTHER): Payer: Self-pay | Admitting: Physician Assistant

## 2017-12-09 VITALS — BP 116/75 | HR 75 | Temp 98.1°F | Ht 75.0 in | Wt 290.4 lb

## 2017-12-09 DIAGNOSIS — E559 Vitamin D deficiency, unspecified: Secondary | ICD-10-CM | POA: Diagnosis not present

## 2017-12-09 DIAGNOSIS — Z6836 Body mass index (BMI) 36.0-36.9, adult: Secondary | ICD-10-CM

## 2017-12-09 NOTE — Progress Notes (Signed)
Office: [phone]  /  Fax: [phone]   HPI:   Chief Complaint: OBESITY Joel Medina is here to discuss his progress with his obesity treatment plan. He is on the Category 4 plan and is following his eating plan approximately 80 % of the time. He states he is walking for 60 minutes 7 times per week. [name] continues to do well with weight loss. He makes mindful food choices and is increasing his lean protein intake. He continues to have challenges with snack choices and is not drinking enough water.  His weight is 290 lb 6.4 oz (131.7 kg) today and has had a weight loss of 1 pound over a period of 2 weeks since his last visit. He has lost 37 lbs since starting treatment with [place].  Vitamin D Deficiency [name] has a diagnosis of vitamin D deficiency. He is currently taking prescription Vit D and denies nausea, vomiting or muscle weakness.  ALLERGIES: Allergies  Allergen Reactions  . Amlodipine     Edema with 10 mg/d    MEDICATIONS: Current Outpatient Medications on File Prior to Visit  Medication Sig Dispense Refill  . BD PEN NEEDLE NANO U/F 32G X 4 MM MISC USE TO INJECT EVERY MORNING 100 each 3  . glucose blood test strip Use as instructed 100 each 0  . Lancets (ACCU-CHEK SAFE-T PRO) lancets Use as instructed 100 each 0  . liraglutide (VICTOZA) 18 MG/3ML SOPN Inject 0.3 mLs (1.8 mg total) into the skin every morning. 3 pen 0  . metFORMIN (GLUCOPHAGE) 1000 MG tablet TAKE 1 TABLET (1,000 MG TOTAL) BY MOUTH 2 (TWO) TIMES DAILY WITH A MEAL. 60 tablet 0  . telmisartan (MICARDIS) 80 MG tablet Take 1 tablet (80 mg total) by mouth daily. 90 tablet 3  . triamterene-hydrochlorothiazide (MAXZIDE-25) 37.5-25 MG tablet Take 1 tablet by mouth daily. 90 tablet 3  . Vitamin D, Ergocalciferol, (DRISDOL) 50000 units CAPS capsule Take 1 capsule (50,000 Units total) by mouth every 7 (seven) days. 4 capsule 0   No current facility-administered medications on file prior to visit.     PAST MEDICAL  HISTORY: Past Medical History:  Diagnosis Date  . Arthritis    OA BOTH HIPS - PAIN IN LEFT HIP WORSE  . Back pain   . Diabetes mellitus without complication (HCC)    oral meds  . History of pyelonephritis   . HTN (hypertension)   . Joint pain   . Obesity   . Prediabetes   . Swelling    feet and legs    PAST SURGICAL HISTORY: Past Surgical History:  Procedure Laterality Date  . [place]  . RIGHT ULNAR NERVE SURGERY TO REMOVE SCAR TISSUE  ? 2006  . TOTAL HIP ARTHROPLASTY Left 02/04/2013   Procedure: LEFT TOTAL HIP ARTHROPLASTY;  Surgeon: [name], MD;  Location: [facility];  Service: Orthopedics;  Laterality: Left;  . TOTAL HIP ARTHROPLASTY Right 11/24/2014   Procedure: RIGHT TOTAL HIP ARTHROPLASTY ANTERIOR APPROACH;  Surgeon: Gaynelle Arabian, MD;  Location: [facility];  Service: Orthopedics;  Laterality: Right;  . WISDOM TEETH EXTRACTIONS  ? 45  . WISDOM TOOTH EXTRACTION      SOCIAL HISTORY: Social History   Tobacco Use  . Smoking status: Never Smoker  . Smokeless tobacco: Never Used  Substance Use Topics  . Alcohol use: Yes    Alcohol/week: 7.2 oz    Types: 12 Cans of beer per week  . Drug use: No    FAMILY  HISTORY: Family History  Problem Relation Age of Onset  . Hypertension Father   . Obesity Father   . Hypertension Mother   . Cancer Mother   . Depression Mother   . Obesity Mother   . Hypertension Other   . Colon cancer Neg Hx   . Colon polyps Neg Hx   . Esophageal cancer Neg Hx   . Rectal cancer Neg Hx   . Stomach cancer Neg Hx     ROS: Review of Systems  Constitutional: Positive for weight loss.  Gastrointestinal: Negative for nausea and vomiting.  Musculoskeletal:       Negative muscle weakness    PHYSICAL EXAM: Blood pressure 116/75, pulse 75, temperature 98.1 F (36.7 C), temperature source Oral, height 6\' 3"  (1.905 m), weight 290 lb 6.4 oz (131.7 kg), SpO2 96 %. Body mass index is 36.3 kg/m. Physical Exam  Constitutional:  He is oriented to person, place, and time. He appears well-developed and well-nourished.  Cardiovascular: Normal rate.  Pulmonary/Chest: Effort normal.  Musculoskeletal: Normal range of motion.  Neurological: He is oriented to person, place, and time.  Skin: Skin is warm and dry.  Psychiatric: He has a normal mood and affect. His behavior is normal.  Vitals reviewed.   RECENT LABS AND TESTS: BMET    Component Value Date/Time   NA 137 07/10/2017 0901   K 4.3 07/10/2017 0901   CL 101 07/10/2017 0901   CO2 21 07/10/2017 0901   GLUCOSE 119 (H) 07/10/2017 0901   GLUCOSE 197 (H) 10/01/2016 1027   BUN 21 07/10/2017 0901   CREATININE 1.01 07/10/2017 0901   CALCIUM 9.5 07/10/2017 0901   GFRNONAA 83 07/10/2017 0901   GFRAA 96 07/10/2017 0901   Lab Results  Component Value Date   HGBA1C 5.7 (H) 07/10/2017   HGBA1C 5.2 04/03/2017   HGBA1C 6.0 (H) 12/27/2016   HGBA1C 7.4 (H) 10/01/2016   HGBA1C 7.7 (H) 06/29/2016   Lab Results  Component Value Date   INSULIN 84.2 (H) 07/10/2017   INSULIN 34.5 (H) 04/03/2017   INSULIN 53.0 (H) 12/27/2016   CBC    Component Value Date/Time   WBC 6.9 12/27/2016 1125   WBC 6.8 10/01/2016 1027   RBC 4.75 12/27/2016 1125   RBC 4.86 10/01/2016 1027   HGB 15.6 12/27/2016 1125   HCT 44.4 12/27/2016 1125   PLT 212 12/27/2016 1125   MCV 94 12/27/2016 1125   MCH 32.8 12/27/2016 1125   MCH 31.7 11/25/2014 0435   MCHC 35.1 12/27/2016 1125   MCHC 34.9 10/01/2016 1027   RDW 14.8 12/27/2016 1125   LYMPHSABS 2.3 12/27/2016 1125   MONOABS 0.4 10/01/2016 1027   EOSABS 0.1 12/27/2016 1125   BASOSABS 0.0 12/27/2016 1125   Iron/TIBC/Ferritin/ %Sat No results found for: IRON, TIBC, FERRITIN, IRONPCTSAT Lipid Panel     Component Value Date/Time   CHOL 158 07/10/2017 0901   TRIG 244 (H) 07/10/2017 0901   HDL 34 (L) 07/10/2017 0901   CHOLHDL 4 10/01/2016 1027   VLDL 33.8 10/01/2016 1027   LDLCALC 75 07/10/2017 0901   LDLDIRECT 93.3 04/02/2014 1632    Hepatic Function Panel     Component Value Date/Time   PROT 6.8 07/10/2017 0901   ALBUMIN 4.2 07/10/2017 0901   AST 18 07/10/2017 0901   ALT 21 07/10/2017 0901   ALKPHOS 71 07/10/2017 0901   BILITOT 0.7 07/10/2017 0901   BILIDIR 0.2 10/01/2016 1027      Component Value Date/Time   TSH  3.180 12/27/2016 1125   TSH 2.60 10/01/2016 1027   TSH 3.26 06/29/2016 1205  Results for CARSEN, LEAF (MRN [id number]) as of 12/09/2017 11:16  Ref. Range 07/10/2017 09:01  Vitamin D, 25-Hydroxy Latest Ref Range: 30.0 - 100.0 ng/mL 25.6 (L)    ASSESSMENT AND PLAN: Vitamin D deficiency  Class 2 severe obesity with serious comorbidity and body mass index (BMI) of 36.0 to 36.9 in adult, unspecified obesity type ([place])  PLAN:  Vitamin D Deficiency Kaeo was informed that low vitamin D levels contributes to fatigue and are associated with obesity, breast, and colon cancer. Izzy agrees to continue taking prescription Vit D @50 ,000 IU every week and will follow up for routine testing of vitamin D, at least 2-3 times per year. He was informed of the risk of over-replacement of vitamin D and agrees to not increase his dose unless he discusses this with [place] first. Guillermo agrees to follow up with our clinic in 2 weeks.  We spent > than 50% of the 15 minute visit on the counseling as documented in the note.  Obesity Wash is currently in the action stage of change. As such, his goal is to continue with weight loss efforts He has agreed to portion control better and make smarter food choices, such as increase vegetables and decrease simple carbohydrates  Travonte has been instructed to work up to a goal of 150 minutes of combined cardio and strengthening exercise per week for weight loss and overall health benefits. We discussed the following Behavioral Modification Strategies today: increase H20 intake and better snacking choices   Aristides has agreed to follow up with our clinic in 2 weeks. He was  informed of the importance of frequent follow up visits to maximize his success with intensive lifestyle modifications for his multiple health conditions.   Wilhemena Durie, am acting as transcriptionist for Lacy Duverney, PA-C I, Lacy Duverney [facility], have reviewed this note and agree with its content

## 2017-12-30 ENCOUNTER — Ambulatory Visit (INDEPENDENT_AMBULATORY_CARE_PROVIDER_SITE_OTHER): Payer: 59 | Admitting: Physician Assistant

## 2017-12-30 ENCOUNTER — Encounter (INDEPENDENT_AMBULATORY_CARE_PROVIDER_SITE_OTHER): Payer: Self-pay | Admitting: Physician Assistant

## 2017-12-30 VITALS — BP 101/67 | HR 78 | Temp 97.5°F | Ht 75.0 in | Wt 295.0 lb

## 2017-12-30 DIAGNOSIS — E559 Vitamin D deficiency, unspecified: Secondary | ICD-10-CM | POA: Diagnosis not present

## 2017-12-30 DIAGNOSIS — Z9189 Other specified personal risk factors, not elsewhere classified: Secondary | ICD-10-CM

## 2017-12-30 DIAGNOSIS — Z6836 Body mass index (BMI) 36.0-36.9, adult: Secondary | ICD-10-CM

## 2017-12-30 DIAGNOSIS — E119 Type 2 diabetes mellitus without complications: Secondary | ICD-10-CM

## 2017-12-30 MED ORDER — LIRAGLUTIDE 18 MG/3ML ~~LOC~~ SOPN: 1.8000 mg | PEN_INJECTOR | Freq: Every morning | SUBCUTANEOUS | 0 refills | Status: DC

## 2017-12-30 MED ORDER — VITAMIN D (ERGOCALCIFEROL) 1.25 MG (50000 UNIT) PO CAPS: 50000.0000 [IU] | ORAL_CAPSULE | ORAL | 0 refills | Status: DC

## 2017-12-30 NOTE — Progress Notes (Signed)
Office: [phone]  /  Fax: [phone]   HPI:   Chief Complaint: OBESITY Joel Medina is here to discuss his progress with his obesity treatment plan. He is on the  portion control better and make smarter food choices plan and is following his eating plan approximately 0 % of the time. He states he is walking 40 to 60 minutes 7 times per week. Joel Medina was on vacation and has not been as mindful of his eating. He also had an increase in liquid calories.  His weight is 295 lb (133.8 kg) today and has had a weight gain of 5 pounds over a period of 3 weeks since his last visit. He has lost 32 lbs since starting treatment with [place].   Vitamin D deficiency Joel Medina has a diagnosis of vitamin D deficiency. He is currently taking vit D and denies nausea, vomiting or muscle weakness  At risk for osteopenia and osteoporosis Joel Medina is at higher risk of osteopenia and osteoporosis due to vitamin D deficiency.   Diabetes II without complications, non insulin Joel Medina has a diagnosis of diabetes type II and he is currently on Victoza and Metformin. Joel Medina is not checking blood sugar at home and he denies any hypoglycemic episodes. Last A1c was at 5.7. He has been working on intensive lifestyle modifications including diet, exercise, and weight loss to help control his blood glucose levels.   ALLERGIES: Allergies  Allergen Reactions  . Amlodipine     Edema with 10 mg/d    MEDICATIONS: Current Outpatient Medications on File Prior to Visit  Medication Sig Dispense Refill  . BD PEN NEEDLE NANO U/F 32G X 4 MM MISC USE TO INJECT EVERY MORNING 100 each 3  . glucose blood test strip Use as instructed 100 each 0  . Lancets (ACCU-CHEK SAFE-T PRO) lancets Use as instructed 100 each 0  . metFORMIN (GLUCOPHAGE) 1000 MG tablet TAKE 1 TABLET (1,000 MG TOTAL) BY MOUTH 2 (TWO) TIMES DAILY WITH A MEAL. 60 tablet 0  . telmisartan (MICARDIS) 80 MG tablet Take 1 tablet (80 mg total) by mouth daily. 90 tablet 3  .  triamterene-hydrochlorothiazide (MAXZIDE-25) 37.5-25 MG tablet Take 1 tablet by mouth daily. 90 tablet 3   No current facility-administered medications on file prior to visit.     PAST MEDICAL HISTORY: Past Medical History:  Diagnosis Date  . Arthritis    OA BOTH HIPS - PAIN IN LEFT HIP WORSE  . Back pain   . Diabetes mellitus without complication (HCC)    oral meds  . History of pyelonephritis   . HTN (hypertension)   . Joint pain   . Obesity   . Prediabetes   . Swelling    feet and legs    PAST SURGICAL HISTORY: Past Surgical History:  Procedure Laterality Date  . [place]  . RIGHT ULNAR NERVE SURGERY TO REMOVE SCAR TISSUE  ? 2006  . TOTAL HIP ARTHROPLASTY Left 02/04/2013   Procedure: LEFT TOTAL HIP ARTHROPLASTY;  Surgeon: [name], MD;  Location: [facility];  Service: Orthopedics;  Laterality: Left;  . TOTAL HIP ARTHROPLASTY Right 11/24/2014   Procedure: RIGHT TOTAL HIP ARTHROPLASTY ANTERIOR APPROACH;  Surgeon: Gaynelle Arabian, MD;  Location: [facility];  Service: Orthopedics;  Laterality: Right;  . WISDOM TEETH EXTRACTIONS  ? 67  . WISDOM TOOTH EXTRACTION      SOCIAL HISTORY: Social History   Tobacco Use  . Smoking status: Never Smoker  . Smokeless tobacco: Never Used  Substance Use Topics  . Alcohol use: Yes    Alcohol/week: 7.2 oz    Types: 12 Cans of beer per week  . Drug use: No    FAMILY HISTORY: Family History  Problem Relation Age of Onset  . Hypertension Father   . Obesity Father   . Hypertension Mother   . Cancer Mother   . Depression Mother   . Obesity Mother   . Hypertension Other   . Colon cancer Neg Hx   . Colon polyps Neg Hx   . Esophageal cancer Neg Hx   . Rectal cancer Neg Hx   . Stomach cancer Neg Hx     ROS: Review of Systems  Constitutional: Negative for weight loss.  Gastrointestinal: Negative for nausea and vomiting.  Musculoskeletal:       Negative for muscle weakness.  Endo/Heme/Allergies:       Negative  for Hypoglycemia.    PHYSICAL EXAM: Blood pressure 101/67, pulse 78, temperature (!) 97.5 F (36.4 C), temperature source Oral, height 6\' 3"  (1.905 m), weight 295 lb (133.8 kg), SpO2 97 %. Body mass index is 36.87 kg/m. Physical Exam  Constitutional: He is oriented to person, place, and time. He appears well-developed and well-nourished.  Cardiovascular: Normal rate.  Pulmonary/Chest: Effort normal.  Musculoskeletal: Normal range of motion.  Neurological: He is oriented to person, place, and time.  Skin: Skin is warm and dry.  Psychiatric: He has a normal mood and affect. His behavior is normal.  Vitals reviewed.   RECENT LABS AND TESTS: BMET    Component Value Date/Time   NA 137 07/10/2017 0901   K 4.3 07/10/2017 0901   CL 101 07/10/2017 0901   CO2 21 07/10/2017 0901   GLUCOSE 119 (H) 07/10/2017 0901   GLUCOSE 197 (H) 10/01/2016 1027   BUN 21 07/10/2017 0901   CREATININE 1.01 07/10/2017 0901   CALCIUM 9.5 07/10/2017 0901   GFRNONAA 83 07/10/2017 0901   GFRAA 96 07/10/2017 0901   Lab Results  Component Value Date   HGBA1C 5.7 (H) 07/10/2017   HGBA1C 5.2 04/03/2017   HGBA1C 6.0 (H) 12/27/2016   HGBA1C 7.4 (H) 10/01/2016   HGBA1C 7.7 (H) 06/29/2016   Lab Results  Component Value Date   INSULIN 84.2 (H) 07/10/2017   INSULIN 34.5 (H) 04/03/2017   INSULIN 53.0 (H) 12/27/2016   CBC    Component Value Date/Time   WBC 6.9 12/27/2016 1125   WBC 6.8 10/01/2016 1027   RBC 4.75 12/27/2016 1125   RBC 4.86 10/01/2016 1027   HGB 15.6 12/27/2016 1125   HCT 44.4 12/27/2016 1125   PLT 212 12/27/2016 1125   MCV 94 12/27/2016 1125   MCH 32.8 12/27/2016 1125   MCH 31.7 11/25/2014 0435   MCHC 35.1 12/27/2016 1125   MCHC 34.9 10/01/2016 1027   RDW 14.8 12/27/2016 1125   LYMPHSABS 2.3 12/27/2016 1125   MONOABS 0.4 10/01/2016 1027   EOSABS 0.1 12/27/2016 1125   BASOSABS 0.0 12/27/2016 1125   Iron/TIBC/Ferritin/ %Sat No results found for: IRON, TIBC, FERRITIN,  IRONPCTSAT Lipid Panel     Component Value Date/Time   CHOL 158 07/10/2017 0901   TRIG 244 (H) 07/10/2017 0901   HDL 34 (L) 07/10/2017 0901   CHOLHDL 4 10/01/2016 1027   VLDL 33.8 10/01/2016 1027   LDLCALC 75 07/10/2017 0901   LDLDIRECT 93.3 04/02/2014 1632   Hepatic Function Panel     Component Value Date/Time   PROT 6.8 07/10/2017 0901   ALBUMIN  4.2 07/10/2017 0901   AST 18 07/10/2017 0901   ALT 21 07/10/2017 0901   ALKPHOS 71 07/10/2017 0901   BILITOT 0.7 07/10/2017 0901   BILIDIR 0.2 10/01/2016 1027      Component Value Date/Time   TSH 3.180 12/27/2016 1125   TSH 2.60 10/01/2016 1027   TSH 3.26 06/29/2016 1205   Results for KAYLEB, WARSHAW (MRN [id number]) as of 12/30/2017 10:39  Ref. Range 07/10/2017 09:01  Vitamin D, 25-Hydroxy Latest Ref Range: 30.0 - 100.0 ng/mL 25.6 (L)   ASSESSMENT AND PLAN: Vitamin D deficiency - Plan: Vitamin D, Ergocalciferol, (DRISDOL) 50000 units CAPS capsule  Type 2 diabetes mellitus without complication, without long-term current use of insulin (HCC) - Plan: liraglutide (VICTOZA) 18 MG/3ML SOPN  At risk for osteoporosis  Class 2 severe obesity with serious comorbidity and body mass index (BMI) of 36.0 to 36.9 in adult, unspecified obesity type ([place])  PLAN:  Vitamin D Deficiency Joel Medina was informed that low vitamin D levels contributes to fatigue and are associated with obesity, breast, and colon cancer. He agrees to continue to take prescription Vit D @50 ,000 IU every week #4 with no refills and will follow up for routine testing of vitamin D, at least 2-3 times per year. He was informed of the risk of over-replacement of vitamin D and agrees to not increase his dose unless he discusses this with [place] first. Joel Medina agrees to follow up as directed.  At risk for osteopenia and osteoporosis Joel Medina is at risk for osteopenia and osteoporosis due to his vitamin D deficiency. He was encouraged to take his vitamin D and follow his higher calcium  diet and increase strengthening exercise to help strengthen his bones and decrease his risk of osteopenia and osteoporosis.  Diabetes II without complications, non insulin Joel Medina has been given extensive diabetes education by myself today including ideal fasting and post-prandial blood glucose readings, individual ideal Hgb A1c goals and hypoglycemia prevention. We discussed the importance of good blood sugar control to decrease the likelihood of diabetic complications such as nephropathy, neuropathy, limb loss, blindness, coronary artery disease, and death. We discussed the importance of intensive lifestyle modification including diet, exercise and weight loss as the first line treatment for diabetes. Joel Medina agrees to continue his diabetes medications, we will refill Victoza #3 pens and he agrees to follow up at the agreed upon time.  Obesity Joel Medina is currently in the action stage of change. As such, his goal is to continue with weight loss efforts He has agreed to portion control better and make smarter food choices, such as increase vegetables and decrease simple carbohydrates  Joel Medina has been instructed to work up to a goal of 150 minutes of combined cardio and strengthening exercise per week for weight loss and overall health benefits. We discussed the following Behavioral Modification Strategies today: work on meal planning and easy cooking plans and decrease liquid calories.  Joel Medina has agreed to follow up with our clinic in 2 to 3 weeks. He was informed of the importance of frequent follow up visits to maximize his success with intensive lifestyle modifications for his multiple health conditions.   Joel Medina, am acting as transcriptionist for [employer], PA-C I, Lacy Duverney [facility], have reviewed this note and agree with its content

## 2018-01-08 ENCOUNTER — Other Ambulatory Visit (INDEPENDENT_AMBULATORY_CARE_PROVIDER_SITE_OTHER): Payer: Self-pay | Admitting: Internal Medicine

## 2018-01-08 DIAGNOSIS — E119 Type 2 diabetes mellitus without complications: Secondary | ICD-10-CM

## 2018-01-14 ENCOUNTER — Ambulatory Visit (INDEPENDENT_AMBULATORY_CARE_PROVIDER_SITE_OTHER): Payer: 59 | Admitting: Family Medicine

## 2018-01-14 VITALS — BP 112/74 | HR 72 | Temp 97.9°F | Ht 75.0 in | Wt 298.0 lb

## 2018-01-14 DIAGNOSIS — R5383 Other fatigue: Secondary | ICD-10-CM | POA: Diagnosis not present

## 2018-01-14 DIAGNOSIS — E119 Type 2 diabetes mellitus without complications: Secondary | ICD-10-CM

## 2018-01-14 DIAGNOSIS — Z9189 Other specified personal risk factors, not elsewhere classified: Secondary | ICD-10-CM

## 2018-01-14 DIAGNOSIS — Z6837 Body mass index (BMI) 37.0-37.9, adult: Secondary | ICD-10-CM

## 2018-01-14 MED ORDER — LIRAGLUTIDE 18 MG/3ML ~~LOC~~ SOPN: 1.8000 mg | PEN_INJECTOR | Freq: Every morning | SUBCUTANEOUS | 0 refills | Status: DC

## 2018-01-14 MED ORDER — INSULIN PEN NEEDLE 32G X 4 MM MISC: 0 refills | Status: DC

## 2018-01-15 NOTE — Progress Notes (Signed)
Office: [phone]  /  Fax: [phone]   HPI:   Chief Complaint: OBESITY Joel Medina is here to discuss his progress with his obesity treatment plan. He is on the portion control better and make smarter food choices, such as increase vegetables and decrease simple carbohydrates and is following his eating plan approximately 70 % of the time. He states he is walking for 45-60 minutes 5 times per week. Joel Medina is retaining fluid today. He has been off track more with eating after moving in his mother in-law who has dementia and is being difficult. He is still adjusting to his caregiver role.  His weight is 298 lb (135.2 kg) today and has gained 3 pounds since his last visit. He has lost 29 lbs since starting treatment with [place].  Diabetes II Joel Medina has a diagnosis of diabetes type II. Joel Medina did bring BGs log, he is working on diet and exercise and tolerating Victoza well. He denies hypoglycemia. Last A1c was 5.7. He has been working on intensive lifestyle modifications including diet, exercise, and weight loss to help control his blood glucose levels.  At risk for cardiovascular disease Joel Medina is at a higher than average risk for cardiovascular disease due to obesity and diabetes II. He currently denies any chest pain.  Caregiver Fatigue Joel Medina denies sleeping or eating as well while adjusting to caring for his mother in-law with dementia. This situation is causing some family strife.  ALLERGIES: Allergies  Allergen Reactions  . Amlodipine     Edema with 10 mg/d    MEDICATIONS: Current Outpatient Medications on File Prior to Visit  Medication Sig Dispense Refill  . glucose blood test strip Use as instructed 100 each 0  . Lancets (ACCU-CHEK SAFE-T PRO) lancets Use as instructed 100 each 0  . metFORMIN (GLUCOPHAGE) 1000 MG tablet TAKE 1 TABLET (1,000 MG TOTAL) BY MOUTH 2 (TWO) TIMES DAILY WITH A MEAL. 60 tablet 11  . telmisartan (MICARDIS) 80 MG tablet Take 1 tablet (80 mg total) by  mouth daily. 90 tablet 3  . triamterene-hydrochlorothiazide (MAXZIDE-25) 37.5-25 MG tablet Take 1 tablet by mouth daily. 90 tablet 3  . Vitamin D, Ergocalciferol, (DRISDOL) 50000 units CAPS capsule Take 1 capsule (50,000 Units total) by mouth every 7 (seven) days. 4 capsule 0   No current facility-administered medications on file prior to visit.     PAST MEDICAL HISTORY: Past Medical History:  Diagnosis Date  . Arthritis    OA BOTH HIPS - PAIN IN LEFT HIP WORSE  . Back pain   . Diabetes mellitus without complication (HCC)    oral meds  . History of pyelonephritis   . HTN (hypertension)   . Joint pain   . Obesity   . Prediabetes   . Swelling    feet and legs    PAST SURGICAL HISTORY: Past Surgical History:  Procedure Laterality Date  . [place]  . RIGHT ULNAR NERVE SURGERY TO REMOVE SCAR TISSUE  ? 2006  . TOTAL HIP ARTHROPLASTY Left 02/04/2013   Procedure: LEFT TOTAL HIP ARTHROPLASTY;  Surgeon: [name], MD;  Location: [facility];  Service: Orthopedics;  Laterality: Left;  . TOTAL HIP ARTHROPLASTY Right 11/24/2014   Procedure: RIGHT TOTAL HIP ARTHROPLASTY ANTERIOR APPROACH;  Surgeon: Gaynelle Arabian, MD;  Location: [facility];  Service: Orthopedics;  Laterality: Right;  . WISDOM TEETH EXTRACTIONS  ? 2  . WISDOM TOOTH EXTRACTION      SOCIAL HISTORY: Social History   Tobacco Use  .  Smoking status: Never Smoker  . Smokeless tobacco: Never Used  Substance Use Topics  . Alcohol use: Yes    Alcohol/week: 7.2 oz    Types: 12 Cans of beer per week  . Drug use: No    FAMILY HISTORY: Family History  Problem Relation Age of Onset  . Hypertension Father   . Obesity Father   . Hypertension Mother   . Cancer Mother   . Depression Mother   . Obesity Mother   . Hypertension Other   . Colon cancer Neg Hx   . Colon polyps Neg Hx   . Esophageal cancer Neg Hx   . Rectal cancer Neg Hx   . Stomach cancer Neg Hx     ROS: Review of Systems  Constitutional:  Positive for malaise/fatigue. Negative for weight loss.  Cardiovascular: Negative for chest pain.  Endo/Heme/Allergies:       Negative hypoglycemia    PHYSICAL EXAM: Blood pressure 112/74, pulse 72, temperature 97.9 F (36.6 C), temperature source Oral, height 6\' 3"  (1.905 m), weight 298 lb (135.2 kg), SpO2 96 %. Body mass index is 37.25 kg/m. Physical Exam  Constitutional: He is oriented to person, place, and time. He appears well-developed and well-nourished.  Cardiovascular: Normal rate.  Pulmonary/Chest: Effort normal.  Musculoskeletal: Normal range of motion.  Neurological: He is oriented to person, place, and time.  Skin: Skin is warm and dry.  Psychiatric: He has a normal mood and affect. His behavior is normal.  Vitals reviewed.   RECENT LABS AND TESTS: BMET    Component Value Date/Time   NA 137 07/10/2017 0901   K 4.3 07/10/2017 0901   CL 101 07/10/2017 0901   CO2 21 07/10/2017 0901   GLUCOSE 119 (H) 07/10/2017 0901   GLUCOSE 197 (H) 10/01/2016 1027   BUN 21 07/10/2017 0901   CREATININE 1.01 07/10/2017 0901   CALCIUM 9.5 07/10/2017 0901   GFRNONAA 83 07/10/2017 0901   GFRAA 96 07/10/2017 0901   Lab Results  Component Value Date   HGBA1C 5.7 (H) 07/10/2017   HGBA1C 5.2 04/03/2017   HGBA1C 6.0 (H) 12/27/2016   HGBA1C 7.4 (H) 10/01/2016   HGBA1C 7.7 (H) 06/29/2016   Lab Results  Component Value Date   INSULIN 84.2 (H) 07/10/2017   INSULIN 34.5 (H) 04/03/2017   INSULIN 53.0 (H) 12/27/2016   CBC    Component Value Date/Time   WBC 6.9 12/27/2016 1125   WBC 6.8 10/01/2016 1027   RBC 4.75 12/27/2016 1125   RBC 4.86 10/01/2016 1027   HGB 15.6 12/27/2016 1125   HCT 44.4 12/27/2016 1125   PLT 212 12/27/2016 1125   MCV 94 12/27/2016 1125   MCH 32.8 12/27/2016 1125   MCH 31.7 11/25/2014 0435   MCHC 35.1 12/27/2016 1125   MCHC 34.9 10/01/2016 1027   RDW 14.8 12/27/2016 1125   LYMPHSABS 2.3 12/27/2016 1125   MONOABS 0.4 10/01/2016 1027   EOSABS 0.1  12/27/2016 1125   BASOSABS 0.0 12/27/2016 1125   Iron/TIBC/Ferritin/ %Sat No results found for: IRON, TIBC, FERRITIN, IRONPCTSAT Lipid Panel     Component Value Date/Time   CHOL 158 07/10/2017 0901   TRIG 244 (H) 07/10/2017 0901   HDL 34 (L) 07/10/2017 0901   CHOLHDL 4 10/01/2016 1027   VLDL 33.8 10/01/2016 1027   LDLCALC 75 07/10/2017 0901   LDLDIRECT 93.3 04/02/2014 1632   Hepatic Function Panel     Component Value Date/Time   PROT 6.8 07/10/2017 0901   ALBUMIN 4.2  07/10/2017 0901   AST 18 07/10/2017 0901   ALT 21 07/10/2017 0901   ALKPHOS 71 07/10/2017 0901   BILITOT 0.7 07/10/2017 0901   BILIDIR 0.2 10/01/2016 1027      Component Value Date/Time   TSH 3.180 12/27/2016 1125   TSH 2.60 10/01/2016 1027   TSH 3.26 06/29/2016 1205    ASSESSMENT AND PLAN: Type 2 diabetes mellitus without complication, without long-term current use of insulin (HCC) - Plan: liraglutide (VICTOZA) 18 MG/3ML SOPN, Insulin Pen Needle (BD PEN NEEDLE NANO U/F) 32G X 4 MM MISC  Caregiver with fatigue  At risk for heart disease  Class 2 severe obesity with serious comorbidity and body mass index (BMI) of 37.0 to 37.9 in adult, unspecified obesity type ([place])  PLAN:  Diabetes II Joel Medina has been given extensive diabetes education by myself today including ideal fasting and post-prandial blood glucose readings, individual ideal Hgb A1c goals and hypoglycemia prevention. We discussed the importance of good blood sugar control to decrease the likelihood of diabetic complications such as nephropathy, neuropathy, limb loss, blindness, coronary artery disease, and death. We discussed the importance of intensive lifestyle modification including diet, exercise and weight loss as the first line treatment for diabetes. Joel Medina agrees to continue Victoza 1.8 mg #3 pens and we will refill for 1 month and we will refill nano needles. Joel Medina agrees to follow up with our clinic in 3 weeks and we will recheck labs at  that time.  Cardiovascular risk counselling Joel Medina was given extended (15 minutes) coronary artery disease prevention counseling today. He is 57 y.o. male and has risk factors for heart disease including obesity and diabetes II. We discussed intensive lifestyle modifications today with an emphasis on specific weight loss instructions and strategies. Pt was also informed of the importance of increasing exercise and decreasing saturated fats to help prevent heart disease.  Caregiver Fatigue We discussed ways to approach loved ones with dementia and to try to not take this personally. Joel Medina agreed to continue to work on his own health while caring for his mother in-law. Joel Medina agrees to follow up with our clinic in 3 weeks.  Obesity Joel Medina is currently in the action stage of change. As such, his goal is to continue with weight loss efforts He has agreed to follow the Category 4 plan Joel Medina has been instructed to work up to a goal of 150 minutes of combined cardio and strengthening exercise per week for weight loss and overall health benefits. We discussed the following Behavioral Modification Strategies today: decrease eating out, work on meal planning and easy cooking plans and dealing with family or coworker sabotage   Joel Medina has agreed to follow up with our clinic in 3 weeks. He was informed of the importance of frequent follow up visits to maximize his success with intensive lifestyle modifications for his multiple health conditions.   I, Trixie Dredge, am acting as transcriptionist for Dennard Nip, MD  I have reviewed the above documentation for accuracy and completeness, and I agree with the above. -Dennard Nip, MD

## 2018-02-04 ENCOUNTER — Ambulatory Visit (INDEPENDENT_AMBULATORY_CARE_PROVIDER_SITE_OTHER): Payer: 59 | Admitting: Family Medicine

## 2018-02-04 VITALS — BP 109/73 | HR 74 | Temp 97.9°F | Ht 75.0 in | Wt 294.0 lb

## 2018-02-04 DIAGNOSIS — E559 Vitamin D deficiency, unspecified: Secondary | ICD-10-CM | POA: Diagnosis not present

## 2018-02-04 DIAGNOSIS — Z6836 Body mass index (BMI) 36.0-36.9, adult: Secondary | ICD-10-CM

## 2018-02-04 DIAGNOSIS — Z9189 Other specified personal risk factors, not elsewhere classified: Secondary | ICD-10-CM

## 2018-02-04 DIAGNOSIS — E119 Type 2 diabetes mellitus without complications: Secondary | ICD-10-CM | POA: Diagnosis not present

## 2018-02-04 DIAGNOSIS — Z0100 Encounter for examination of eyes and vision without abnormal findings: Secondary | ICD-10-CM | POA: Diagnosis not present

## 2018-02-04 DIAGNOSIS — E782 Mixed hyperlipidemia: Secondary | ICD-10-CM | POA: Diagnosis not present

## 2018-02-04 LAB — HM DIABETES EYE EXAM

## 2018-02-04 NOTE — Progress Notes (Signed)
Office: [phone]  /  Fax: [phone]   HPI:   Chief Complaint: OBESITY Joel Medina is here to discuss his progress with his obesity treatment plan. He is on the Category 4 plan and is following his eating plan approximately 70 % of the time. He states he is walking for 45-60 minutes 7 times per week. Joel Medina continues to do well with weight loss on Category 4 plan. He has increased temptations at work in the afternoon.  His weight is 294 lb (133.4 kg) today and has had a weight loss of 4 pounds over a period of 3 weeks since his last visit. He has lost 33 lbs since starting treatment with [place].  Joel Medina has a diagnosis of Joel type II. Joel Medina is stable on Victoza and metformin, he states he is not checking BGs at home but has been well controlled. He denies any hypoglycemic episodes. Last A1c was 5.7. He has been working on intensive lifestyle modifications including diet, exercise, and weight loss to help control his blood glucose levels.  Hyperlipidemia (Mixed) Joel Medina has hyperlipidemia and he is attempting to improve his cholesterol levels with intensive lifestyle modification including a low saturated fat diet, exercise and weight loss. He is not on statin and has atherosclerotic cardiovascular disease risk at 19%. He denies any chest pain, claudication or myalgias.  At risk for cardiovascular disease Joel Medina is at a higher than average risk for cardiovascular disease due to obesity, Joel II, and hyperlipidemia. He currently denies any chest pain.  Vitamin D Deficiency Joel Medina has a diagnosis of vitamin D deficiency. He is stable on prescription Vit D, not yet at goal and he is due for labs. He denies nausea, vomiting or muscle weakness.  ALLERGIES: Allergies  Allergen Reactions  . Amlodipine     Edema with 10 mg/d    MEDICATIONS: Current Outpatient Medications on File Prior to Visit  Medication Sig Dispense Refill  . glucose blood test strip Use as instructed  100 each 0  . Insulin Pen Needle (BD PEN NEEDLE NANO U/F) 32G X 4 MM MISC USE TO INJECT EVERY MORNING 100 each 0  . Lancets (ACCU-CHEK SAFE-T PRO) lancets Use as instructed 100 each 0  . liraglutide (VICTOZA) 18 MG/3ML SOPN Inject 0.3 mLs (1.8 mg total) into the skin every morning. 3 pen 0  . metFORMIN (GLUCOPHAGE) 1000 MG tablet TAKE 1 TABLET (1,000 MG TOTAL) BY MOUTH 2 (TWO) TIMES DAILY WITH A MEAL. 60 tablet 11  . telmisartan (MICARDIS) 80 MG tablet Take 1 tablet (80 mg total) by mouth daily. 90 tablet 3  . triamterene-hydrochlorothiazide (MAXZIDE-25) 37.5-25 MG tablet Take 1 tablet by mouth daily. 90 tablet 3  . Vitamin D, Ergocalciferol, (DRISDOL) 50000 units CAPS capsule Take 1 capsule (50,000 Units total) by mouth every 7 (seven) days. 4 capsule 0   No current facility-administered medications on file prior to visit.     PAST MEDICAL HISTORY: Past Medical History:  Diagnosis Date  . Arthritis    OA BOTH HIPS - PAIN IN LEFT HIP WORSE  . Back pain   . Joel mellitus without complication (HCC)    oral meds  . History of pyelonephritis   . HTN (hypertension)   . Joint pain   . Obesity   . Prediabetes   . Swelling    feet and legs    PAST SURGICAL HISTORY: Past Surgical History:  Procedure Laterality Date  . Joel Medina  . RIGHT ULNAR NERVE SURGERY  TO REMOVE SCAR TISSUE  ? 2006  . TOTAL HIP ARTHROPLASTY Left 02/04/2013   Procedure: LEFT TOTAL HIP ARTHROPLASTY;  Surgeon: [name], MD;  Location: [facility];  Service: Orthopedics;  Laterality: Left;  . TOTAL HIP ARTHROPLASTY Right 11/24/2014   Procedure: RIGHT TOTAL HIP ARTHROPLASTY ANTERIOR APPROACH;  Surgeon: Gaynelle Arabian, MD;  Location: [facility];  Service: Orthopedics;  Laterality: Right;  . WISDOM TEETH EXTRACTIONS  ? 37  . WISDOM TOOTH EXTRACTION      SOCIAL HISTORY: Social History   Tobacco Use  . Smoking status: Never Smoker  . Smokeless tobacco: Never Used  Substance Use Topics  . Alcohol  use: Yes    Alcohol/week: 12.0 standard drinks    Types: 12 Cans of beer per week  . Drug use: No    FAMILY HISTORY: Family History  Problem Relation Age of Onset  . Hypertension Father   . Obesity Father   . Hypertension Mother   . Cancer Mother   . Depression Mother   . Obesity Mother   . Hypertension Other   . Colon cancer Neg Hx   . Colon polyps Neg Hx   . Esophageal cancer Neg Hx   . Rectal cancer Neg Hx   . Stomach cancer Neg Hx     ROS: Review of Systems  Constitutional: Positive for weight loss.  Cardiovascular: Negative for chest pain and claudication.  Gastrointestinal: Negative for nausea and vomiting.  Musculoskeletal: Negative for myalgias.       Negative muscle weakness  Endo/Heme/Allergies:       Negative hypoglycemia    PHYSICAL EXAM: Blood pressure 109/73, pulse 74, temperature 97.9 F (36.6 C), temperature source Oral, height 6\' 3"  (1.905 m), weight 294 lb (133.4 kg), SpO2 96 %. Body mass index is 36.75 kg/m. Physical Exam  Constitutional: He is oriented to person, place, and time. He appears well-developed and well-nourished.  Cardiovascular: Normal rate.  Pulmonary/Chest: Effort normal.  Musculoskeletal: Normal range of motion.  Neurological: He is oriented to person, place, and time.  Skin: Skin is warm and dry.  Psychiatric: He has a normal mood and affect. His behavior is normal.  Vitals reviewed.   RECENT LABS AND TESTS: BMET    Component Value Date/Time   NA 137 07/10/2017 0901   K 4.3 07/10/2017 0901   CL 101 07/10/2017 0901   CO2 21 07/10/2017 0901   GLUCOSE 119 (H) 07/10/2017 0901   GLUCOSE 197 (H) 10/01/2016 1027   BUN 21 07/10/2017 0901   CREATININE 1.01 07/10/2017 0901   CALCIUM 9.5 07/10/2017 0901   GFRNONAA 83 07/10/2017 0901   GFRAA 96 07/10/2017 0901   Lab Results  Component Value Date   HGBA1C 5.7 (H) 07/10/2017   HGBA1C 5.2 04/03/2017   HGBA1C 6.0 (H) 12/27/2016   HGBA1C 7.4 (H) 10/01/2016   HGBA1C 7.7 (H)  06/29/2016   Lab Results  Component Value Date   INSULIN 84.2 (H) 07/10/2017   INSULIN 34.5 (H) 04/03/2017   INSULIN 53.0 (H) 12/27/2016   CBC    Component Value Date/Time   WBC 6.9 12/27/2016 1125   WBC 6.8 10/01/2016 1027   RBC 4.75 12/27/2016 1125   RBC 4.86 10/01/2016 1027   HGB 15.6 12/27/2016 1125   HCT 44.4 12/27/2016 1125   PLT 212 12/27/2016 1125   MCV 94 12/27/2016 1125   MCH 32.8 12/27/2016 1125   MCH 31.7 11/25/2014 0435   MCHC 35.1 12/27/2016 1125   MCHC 34.9 10/01/2016 1027  RDW 14.8 12/27/2016 1125   LYMPHSABS 2.3 12/27/2016 1125   MONOABS 0.4 10/01/2016 1027   EOSABS 0.1 12/27/2016 1125   BASOSABS 0.0 12/27/2016 1125   Iron/TIBC/Ferritin/ %Sat No results found for: IRON, TIBC, FERRITIN, IRONPCTSAT Lipid Panel     Component Value Date/Time   CHOL 158 07/10/2017 0901   TRIG 244 (H) 07/10/2017 0901   HDL 34 (L) 07/10/2017 0901   CHOLHDL 4 10/01/2016 1027   VLDL 33.8 10/01/2016 1027   LDLCALC 75 07/10/2017 0901   LDLDIRECT 93.3 04/02/2014 1632   Hepatic Function Panel     Component Value Date/Time   PROT 6.8 07/10/2017 0901   ALBUMIN 4.2 07/10/2017 0901   AST 18 07/10/2017 0901   ALT 21 07/10/2017 0901   ALKPHOS 71 07/10/2017 0901   BILITOT 0.7 07/10/2017 0901   BILIDIR 0.2 10/01/2016 1027      Component Value Date/Time   TSH 3.180 12/27/2016 1125   TSH 2.60 10/01/2016 1027   TSH 3.26 06/29/2016 1205  Results for Joel Medina, Joel Medina (MRN [id number]) as of 02/04/2018 08:09  Ref. Range 07/10/2017 09:01  Vitamin D, 25-Hydroxy Latest Ref Range: 30.0 - 100.0 ng/mL 25.6 (L)    ASSESSMENT AND PLAN: Type 2 Joel mellitus without complication, without long-term current use of insulin (HCC) - Plan: Microalbumin / creatinine urine ratio, Insulin, random, Hemoglobin A1c, Comprehensive metabolic panel  Vitamin D deficiency - Plan: VITAMIN D 25 Hydroxy (Vit-D Deficiency, Fractures)  Mixed hyperlipidemia - Plan: Lipid Panel With LDL/HDL Ratio  At risk  for heart disease  Class 2 severe obesity with serious comorbidity and body mass index (BMI) of 36.0 to 36.9 in adult, unspecified obesity type ([place])  PLAN:  Joel II Joel Medina has been given extensive Joel education by myself today including ideal fasting and post-prandial blood glucose readings, individual ideal Hgb A1c goals and hypoglycemia prevention. We discussed the importance of good blood sugar control to decrease the likelihood of diabetic complications such as nephropathy, neuropathy, limb loss, blindness, coronary artery disease, and death. We discussed the importance of intensive lifestyle modification including diet, exercise and weight loss as the first line treatment for Joel. Joel Medina agrees to continue his Joel medications, and diet, and we will will check labs. Joel Medina agrees to follow up with our clinic in 2 to 3 weeks.  Hyperlipidemia (Mixed) Joel Medina was informed of the American Heart Association Guidelines emphasizing intensive lifestyle modifications as the first line treatment for hyperlipidemia. We discussed many lifestyle modifications today in depth, and Joel Medina will continue to work on decreasing saturated fats such as fatty red meat, butter and many fried foods. He will also increase vegetables and lean protein in his diet and continue to work on diet, exercise, and weight loss efforts. We will check labs and we will discuss labs results and statin use in 2 to 3 weeks at his next follow up visit.  Cardiovascular risk counselling Joel Medina was given extended (15 minutes) coronary artery disease prevention counseling today. He is 57 y.o. male and has risk factors for heart disease including obesity, Joel II, and hyperlipidemia. We discussed intensive lifestyle modifications today with an emphasis on specific weight loss instructions and strategies. Pt was also informed of the importance of increasing exercise and decreasing saturated fats to help prevent heart  disease.  Vitamin D Deficiency Joel Medina was informed that low vitamin D levels contributes to fatigue and are associated with obesity, breast, and colon cancer. Joel Medina agrees to continue taking prescription Vit D @50 ,000 IU every week  and will follow up for routine testing of vitamin D, at least 2-3 times per year. He was informed of the risk of over-replacement of vitamin D and agrees to not increase his dose unless he discusses this with [place] first. We will check labs and Joel Medina agrees to follow up with our clinic in 2 to 3 weeks.  Obesity Joel Medina is currently in the action stage of change. As such, his goal is to continue with weight loss efforts He has agreed to follow the Category 4 plan Joel Medina has been instructed to work up to a goal of 150 minutes of combined cardio and strengthening exercise per week for weight loss and overall health benefits. We discussed the following Behavioral Modification Strategies today: work on meal planning and easy cooking plans, dealing with family or coworker sabotage and emotional eating strategies   Joel Medina has agreed to follow up with our clinic in 2 to 3 weeks. He was informed of the importance of frequent follow up visits to maximize his success with intensive lifestyle modifications for his multiple health conditions.   OBESITY BEHAVIORAL INTERVENTION VISIT  Today's visit was # 25.  Starting weight: 327 lbs Starting date: 12/27/16 Today's weight : 294 lbs  Today's date: 02/04/2018 Total lbs lost to date: 39    ASK: We discussed the diagnosis of obesity with Joel Medina today and Joel Medina agreed to give [place] permission to discuss obesity behavioral modification therapy today.  ASSESS: Joel Medina has the diagnosis of obesity and his BMI today is 36.75 Joel Medina is in the action stage of change   ADVISE: Joel Medina was educated on the multiple health risks of obesity as well as the benefit of weight loss to improve his health. He was advised of the need for  long term treatment and the importance of lifestyle modifications.  AGREE: Multiple dietary modification options and treatment options were discussed and  Joel Medina agreed to the above obesity treatment plan.  I, Trixie Dredge, am acting as transcriptionist for Dennard Nip, MD  I have reviewed the above documentation for accuracy and completeness, and I agree with the above. -Dennard Nip, MD

## 2018-02-05 LAB — LIPID PANEL WITH LDL/HDL RATIO
CHOLESTEROL TOTAL: 153 mg/dL (ref 100–199)
HDL: 35 mg/dL — ABNORMAL LOW (ref >39)
LDL Calculated: 85 mg/dL (ref 0–99)
LDl/HDL Ratio: 2.4 ratio (ref 0.0–3.6)
TRIGLYCERIDES: 164 mg/dL — AB (ref 0–149)
VLDL Cholesterol Cal: 33 mg/dL (ref 5–40)

## 2018-02-05 LAB — MICROALBUMIN / CREATININE URINE RATIO: Creatinine, Urine: 70.6 mg/dL

## 2018-02-05 LAB — COMPREHENSIVE METABOLIC PANEL
ALT: 14 IU/L (ref 0–44)
AST: 19 IU/L (ref 0–40)
Albumin/Globulin Ratio: 1.7 (ref 1.2–2.2)
Albumin: 4.4 g/dL (ref 3.5–5.5)
Alkaline Phosphatase: 62 IU/L (ref 39–117)
BUN/Creatinine Ratio: 17 (ref 9–20)
BUN: 17 mg/dL (ref 6–24)
Bilirubin Total: 0.9 mg/dL (ref 0.0–1.2)
CALCIUM: 9.7 mg/dL (ref 8.7–10.2)
CHLORIDE: 101 mmol/L (ref 96–106)
CO2: 21 mmol/L (ref 20–29)
Creatinine, Ser: 1.03 mg/dL (ref 0.76–1.27)
GFR calc Af Amer: 93 mL/min/{1.73_m2} (ref >59)
GFR, EST NON AFRICAN AMERICAN: 81 mL/min/{1.73_m2} (ref >59)
GLUCOSE: 105 mg/dL — AB (ref 65–99)
Globulin, Total: 2.6 g/dL (ref 1.5–4.5)
Potassium: 4.4 mmol/L (ref 3.5–5.2)
Sodium: 137 mmol/L (ref 134–144)
TOTAL PROTEIN: 7 g/dL (ref 6.0–8.5)

## 2018-02-05 LAB — INSULIN, RANDOM: INSULIN: 28.1 u[IU]/mL — ABNORMAL HIGH (ref 2.6–24.9)

## 2018-02-05 LAB — VITAMIN D 25 HYDROXY (VIT D DEFICIENCY, FRACTURES): VIT D 25 HYDROXY: 36.9 ng/mL (ref 30.0–100.0)

## 2018-02-05 LAB — HEMOGLOBIN A1C
ESTIMATED AVERAGE GLUCOSE: 114 mg/dL
Hgb A1c MFr Bld: 5.6 % (ref 4.8–5.6)

## 2018-02-07 ENCOUNTER — Encounter: Payer: Self-pay | Admitting: Internal Medicine

## 2018-02-12 ENCOUNTER — Encounter: Payer: Self-pay | Admitting: Internal Medicine

## 2018-02-12 ENCOUNTER — Ambulatory Visit (INDEPENDENT_AMBULATORY_CARE_PROVIDER_SITE_OTHER): Payer: 59 | Admitting: Internal Medicine

## 2018-02-12 VITALS — BP 118/80 | HR 80 | Temp 98.2°F | Ht 75.0 in | Wt 296.0 lb

## 2018-02-12 DIAGNOSIS — E559 Vitamin D deficiency, unspecified: Secondary | ICD-10-CM | POA: Diagnosis not present

## 2018-02-12 DIAGNOSIS — Z0000 Encounter for general adult medical examination without abnormal findings: Secondary | ICD-10-CM | POA: Diagnosis not present

## 2018-02-12 DIAGNOSIS — I10 Essential (primary) hypertension: Secondary | ICD-10-CM | POA: Diagnosis not present

## 2018-02-12 DIAGNOSIS — E119 Type 2 diabetes mellitus without complications: Secondary | ICD-10-CM

## 2018-02-12 MED ORDER — TRIAMTERENE-HCTZ 37.5-25 MG PO TABS: 1.0000 | ORAL_TABLET | Freq: Every day | ORAL | 3 refills | Status: DC

## 2018-02-12 MED ORDER — LIRAGLUTIDE 18 MG/3ML ~~LOC~~ SOPN: 1.8000 mg | PEN_INJECTOR | Freq: Every morning | SUBCUTANEOUS | 3 refills | Status: DC

## 2018-02-12 MED ORDER — METFORMIN HCL 1000 MG PO TABS: ORAL_TABLET | ORAL | 3 refills | Status: DC

## 2018-02-12 MED ORDER — TELMISARTAN 80 MG PO TABS: 80.0000 mg | ORAL_TABLET | Freq: Every day | ORAL | 3 refills | Status: DC

## 2018-02-12 NOTE — Assessment & Plan Note (Signed)
Labs We discussed age appropriate health related issues, including available/recomended screening tests and vaccinations. We discussed a need for adhering to healthy diet and exercise. Labs were ordered to be later reviewed . All questions were answered.

## 2018-02-12 NOTE — Assessment & Plan Note (Signed)
Try Maxzide 1/2 tab a day

## 2018-02-12 NOTE — Assessment & Plan Note (Signed)
On meds Better w/wt loss

## 2018-02-12 NOTE — Assessment & Plan Note (Signed)
Vit D 

## 2018-02-12 NOTE — Patient Instructions (Signed)
Try Maxzide 1/2 tab a day

## 2018-02-12 NOTE — Progress Notes (Signed)
Subjective:  Patient ID: Joel Medina, male    DOB: 1960-08-08  Age: 57 y.o. MRN: [id number]  CC: No chief complaint on file.   HPI Joel Medina presents for HTN, DM, LBP. Well exam Lost 45 lbs Well exam  Outpatient Medications Prior to Visit  Medication Sig Dispense Refill  . glucose blood test strip Use as instructed 100 each 0  . Insulin Pen Needle (BD PEN NEEDLE NANO U/F) 32G X 4 MM MISC USE TO INJECT EVERY MORNING 100 each 0  . Lancets (ACCU-CHEK SAFE-T PRO) lancets Use as instructed 100 each 0  . liraglutide (VICTOZA) 18 MG/3ML SOPN Inject 0.3 mLs (1.8 mg total) into the skin every morning. 3 pen 0  . metFORMIN (GLUCOPHAGE) 1000 MG tablet TAKE 1 TABLET (1,000 MG TOTAL) BY MOUTH 2 (TWO) TIMES DAILY WITH A MEAL. 60 tablet 11  . telmisartan (MICARDIS) 80 MG tablet Take 1 tablet (80 mg total) by mouth daily. 90 tablet 3  . triamterene-hydrochlorothiazide (MAXZIDE-25) 37.5-25 MG tablet Take 1 tablet by mouth daily. 90 tablet 3  . Vitamin D, Ergocalciferol, (DRISDOL) 50000 units CAPS capsule Take 1 capsule (50,000 Units total) by mouth every 7 (seven) days. 4 capsule 0   No facility-administered medications prior to visit.     ROS: Review of Systems  Constitutional: Negative for appetite change, fatigue and unexpected weight change.  HENT: Negative for congestion, nosebleeds, sneezing, sore throat and trouble swallowing.   Eyes: Negative for itching and visual disturbance.  Respiratory: Negative for cough.   Cardiovascular: Negative for chest pain, palpitations and leg swelling.  Gastrointestinal: Negative for abdominal distention, blood in stool, diarrhea and nausea.  Genitourinary: Negative for frequency and hematuria.  Musculoskeletal: Positive for arthralgias. Negative for back pain, gait problem, joint swelling and neck pain.  Skin: Negative for rash.  Neurological: Negative for dizziness, tremors, speech difficulty and weakness.  Psychiatric/Behavioral: Negative for  agitation, dysphoric mood, sleep disturbance and suicidal ideas. The patient is not nervous/anxious.     Objective:  BP 118/80 (BP Location: Left Arm, Patient Position: Sitting, Cuff Size: Large)   Pulse 80   Temp 98.2 F (36.8 C) (Oral)   Ht 6\' 3"  (1.905 m)   Wt 296 lb (134.3 kg)   SpO2 96%   BMI 37.00 kg/m   BP Readings from Last 3 Encounters:  02/12/18 118/80  02/04/18 109/73  01/14/18 112/74    Wt Readings from Last 3 Encounters:  02/12/18 296 lb (134.3 kg)  02/04/18 294 lb (133.4 kg)  01/14/18 298 lb (135.2 kg)    Physical Exam  Constitutional: He is oriented to person, place, and time. He appears well-developed. No distress.  NAD  HENT:  Mouth/Throat: Oropharynx is clear and moist.  Eyes: Pupils are equal, round, and reactive to light. Conjunctivae are normal.  Neck: Normal range of motion. No JVD present. No thyromegaly present.  Cardiovascular: Normal rate, regular rhythm, normal heart sounds and intact distal pulses. Exam reveals no gallop and no friction rub.  No murmur heard. Pulmonary/Chest: Effort normal and breath sounds normal. No respiratory distress. He has no wheezes. He has no rales. He exhibits no tenderness.  Abdominal: Soft. Bowel sounds are normal. He exhibits no distension and no mass. There is no tenderness. There is no rebound and no guarding.  Genitourinary: Rectum normal. Rectal exam shows guaiac negative stool.  Musculoskeletal: Normal range of motion. He exhibits no edema or tenderness.  Lymphadenopathy:    He has no cervical adenopathy.  Neurological: He is alert and oriented to person, place, and time. He has normal reflexes. No cranial nerve deficit. He exhibits normal muscle tone. He displays a negative Romberg sign. Coordination and gait normal.  Skin: Skin is warm and dry. No rash noted.  Psychiatric: He has a normal mood and affect. His behavior is normal. Judgment and thought content normal.  prostate 1+  Lab Results  Component  Value Date   WBC 6.9 12/27/2016   HGB 15.6 12/27/2016   HCT 44.4 12/27/2016   PLT 212 12/27/2016   GLUCOSE 105 (H) 02/04/2018   CHOL 153 02/04/2018   TRIG 164 (H) 02/04/2018   HDL 35 (L) 02/04/2018   LDLDIRECT 93.3 04/02/2014   LDLCALC 85 02/04/2018   ALT 14 02/04/2018   AST 19 02/04/2018   NA 137 02/04/2018   K 4.4 02/04/2018   CL 101 02/04/2018   CREATININE 1.03 02/04/2018   BUN 17 02/04/2018   CO2 21 02/04/2018   TSH 3.180 12/27/2016   PSA 2.20 10/01/2016   INR 1.08 11/19/2014   HGBA1C 5.6 02/04/2018   MICROALBUR 1.0 10/01/2016    Mr Brain W Wo Contrast  Result Date: 07/16/2016 CLINICAL DATA:  57 y/o M; mild headache and slurring of speech since fall 04/2016 and injury to right-sided head. EXAM: MRI HEAD WITHOUT AND WITH CONTRAST TECHNIQUE: Multiplanar, multiecho pulse sequences of the brain and surrounding structures were obtained without and with intravenous contrast. CONTRAST:  65mL MULTIHANCE GADOBENATE DIMEGLUMINE 529 MG/ML IV SOLN COMPARISON:  None. FINDINGS: Brain: No acute infarction, hemorrhage, hydrocephalus, extra-axial collection or mass lesion. Incidental prominent perivascular spaces in the left lateral frontal and right parietal white matter. No abnormal susceptibility hypointensity to suggest hemosiderin deposition. Single punctate focus of T2 FLAIR hyperintensity within the right frontal subcortical white matter is of unlikely clinical significance. No abnormal enhancement. Vascular: Normal flow voids. Skull and upper cervical spine: Normal marrow signal. Sinuses/Orbits: Trace right mastoid effusion. Mucosal thickening within the sphenoid sinus, maxillary sinuses, and anterior ethmoid air cells. Orbits are unremarkable. Other: None. IMPRESSION: No evidence for cortical contusion or diffuse axonal injury. Unremarkable MRI of the head with and without contrast for age. Electronically Signed   By: Kristine Garbe M.D.   On: 07/16/2016 15:52    Assessment &  Plan:   There are no diagnoses linked to this encounter.   No orders of the defined types were placed in this encounter.    Follow-up: No follow-ups on file.  Walker Kehr, MD

## 2018-02-12 NOTE — Assessment & Plan Note (Signed)
Better  

## 2018-02-25 ENCOUNTER — Ambulatory Visit (INDEPENDENT_AMBULATORY_CARE_PROVIDER_SITE_OTHER): Payer: 59 | Admitting: Family Medicine

## 2018-02-25 VITALS — BP 107/69 | HR 77 | Temp 97.6°F | Ht 75.0 in | Wt 293.0 lb

## 2018-02-25 DIAGNOSIS — E119 Type 2 diabetes mellitus without complications: Secondary | ICD-10-CM | POA: Diagnosis not present

## 2018-02-25 DIAGNOSIS — Z9189 Other specified personal risk factors, not elsewhere classified: Secondary | ICD-10-CM | POA: Diagnosis not present

## 2018-02-25 DIAGNOSIS — E559 Vitamin D deficiency, unspecified: Secondary | ICD-10-CM | POA: Diagnosis not present

## 2018-02-25 DIAGNOSIS — Z6836 Body mass index (BMI) 36.0-36.9, adult: Secondary | ICD-10-CM

## 2018-02-25 MED ORDER — GLUCOSE BLOOD VI STRP: ORAL_STRIP | 0 refills | Status: DC

## 2018-02-25 MED ORDER — ACCU-CHEK SAFE-T PRO LANCETS MISC: 0 refills | Status: DC

## 2018-02-25 MED ORDER — VITAMIN D (ERGOCALCIFEROL) 1.25 MG (50000 UNIT) PO CAPS: 50000.0000 [IU] | ORAL_CAPSULE | ORAL | 1 refills | Status: DC

## 2018-02-25 NOTE — Progress Notes (Signed)
Office: [phone]  /  Fax: [phone]   HPI:   Chief Complaint: OBESITY Joel Medina is here to discuss his progress with his obesity treatment plan. He is on the Category 4 plan and is following his eating plan approximately 70 % of the time. He states he is walking for 60 minutes 7 times per week. Joel Medina continues to do well with weight loss, he notes increased celebration eating over the last month but didn't gain.  His weight is 293 lb (132.9 kg) today and has had a weight loss of 1 pound over a period of 3 weeks since his last visit. He has lost 34 lbs since starting treatment with [place].  Vitamin D Deficiency Joel Medina has a diagnosis of vitamin D deficiency. He is stable on prescription Vit D, his level is slowly improving but not yet at goal. He denies nausea, vomiting or muscle weakness.  Diabetes II Joel Medina has a diagnosis of diabetes type II. Joel Medina A1c is controlled at 5.6 on Victoza and metformin. He denies nausea, vomiting, or hypoglycemia. He has been working on intensive lifestyle modifications including diet, exercise, and weight loss to help control his blood glucose levels.  At risk for cardiovascular disease Joel Medina is at a higher than average risk for cardiovascular disease due to obesity and diabetes II. He currently denies any chest pain.  ALLERGIES: Allergies  Allergen Reactions  . Amlodipine     Edema with 10 mg/d    MEDICATIONS: Current Outpatient Medications on File Prior to Visit  Medication Sig Dispense Refill  . Insulin Pen Needle (BD PEN NEEDLE NANO U/F) 32G X 4 MM MISC USE TO INJECT EVERY MORNING 100 each 0  . liraglutide (VICTOZA) 18 MG/3ML SOPN Inject 0.3 mLs (1.8 mg total) into the skin every morning. 3 pen 3  . metFORMIN (GLUCOPHAGE) 1000 MG tablet 1 po bid 180 tablet 3  . telmisartan (MICARDIS) 80 MG tablet Take 1 tablet (80 mg total) by mouth daily. 90 tablet 3  . triamterene-hydrochlorothiazide (MAXZIDE-25) 37.5-25 MG tablet Take 1 tablet by mouth  daily. 90 tablet 3   No current facility-administered medications on file prior to visit.     PAST MEDICAL HISTORY: Past Medical History:  Diagnosis Date  . Arthritis    OA BOTH HIPS - PAIN IN LEFT HIP WORSE  . Back pain   . Diabetes mellitus without complication (HCC)    oral meds  . History of pyelonephritis   . HTN (hypertension)   . Joint pain   . Obesity   . Prediabetes   . Swelling    feet and legs    PAST SURGICAL HISTORY: Past Surgical History:  Procedure Laterality Date  . [place]  . RIGHT ULNAR NERVE SURGERY TO REMOVE SCAR TISSUE  ? 2006  . TOTAL HIP ARTHROPLASTY Left 02/04/2013   Procedure: LEFT TOTAL HIP ARTHROPLASTY;  Surgeon: [name], MD;  Location: [facility];  Service: Orthopedics;  Laterality: Left;  . TOTAL HIP ARTHROPLASTY Right 11/24/2014   Procedure: RIGHT TOTAL HIP ARTHROPLASTY ANTERIOR APPROACH;  Surgeon: Gaynelle Arabian, MD;  Location: [facility];  Service: Orthopedics;  Laterality: Right;  . WISDOM TEETH EXTRACTIONS  ? 24  . WISDOM TOOTH EXTRACTION      SOCIAL HISTORY: Social History   Tobacco Use  . Smoking status: Never Smoker  . Smokeless tobacco: Never Used  Substance Use Topics  . Alcohol use: Yes    Alcohol/week: 12.0 standard drinks    Types: 12 Cans  of beer per week  . Drug use: No    FAMILY HISTORY: Family History  Problem Relation Age of Onset  . Hypertension Father   . Obesity Father   . Hypertension Mother   . Cancer Mother   . Depression Mother   . Obesity Mother   . Hypertension Other   . Colon cancer Neg Hx   . Colon polyps Neg Hx   . Esophageal cancer Neg Hx   . Rectal cancer Neg Hx   . Stomach cancer Neg Hx     ROS: Review of Systems  Constitutional: Positive for weight loss.  Cardiovascular: Negative for chest pain.  Gastrointestinal: Negative for nausea and vomiting.  Musculoskeletal:       Negative muscle weakness  Endo/Heme/Allergies:       Negative hypoglycemia    PHYSICAL  EXAM: Blood pressure 107/69, pulse 77, temperature 97.6 F (36.4 C), temperature source Oral, height 6\' 3"  (1.905 m), weight 293 lb (132.9 kg), SpO2 98 %. Body mass index is 36.62 kg/m. Physical Exam  Constitutional: He is oriented to person, place, and time. He appears well-developed and well-nourished.  Cardiovascular: Normal rate.  Pulmonary/Chest: Effort normal.  Musculoskeletal: Normal range of motion.  Neurological: He is oriented to person, place, and time.  Skin: Skin is warm and dry.  Psychiatric: He has a normal mood and affect. His behavior is normal.  Vitals reviewed.   RECENT LABS AND TESTS: BMET    Component Value Date/Time   NA 137 02/04/2018 0757   K 4.4 02/04/2018 0757   CL 101 02/04/2018 0757   CO2 21 02/04/2018 0757   GLUCOSE 105 (H) 02/04/2018 0757   GLUCOSE 197 (H) 10/01/2016 1027   BUN 17 02/04/2018 0757   CREATININE 1.03 02/04/2018 0757   CALCIUM 9.7 02/04/2018 0757   GFRNONAA 81 02/04/2018 0757   GFRAA 93 02/04/2018 0757   Lab Results  Component Value Date   HGBA1C 5.6 02/04/2018   HGBA1C 5.7 (H) 07/10/2017   HGBA1C 5.2 04/03/2017   HGBA1C 6.0 (H) 12/27/2016   HGBA1C 7.4 (H) 10/01/2016   Lab Results  Component Value Date   INSULIN 28.1 (H) 02/04/2018   INSULIN 84.2 (H) 07/10/2017   INSULIN 34.5 (H) 04/03/2017   INSULIN 53.0 (H) 12/27/2016   CBC    Component Value Date/Time   WBC 6.9 12/27/2016 1125   WBC 6.8 10/01/2016 1027   RBC 4.75 12/27/2016 1125   RBC 4.86 10/01/2016 1027   HGB 15.6 12/27/2016 1125   HCT 44.4 12/27/2016 1125   PLT 212 12/27/2016 1125   MCV 94 12/27/2016 1125   MCH 32.8 12/27/2016 1125   MCH 31.7 11/25/2014 0435   MCHC 35.1 12/27/2016 1125   MCHC 34.9 10/01/2016 1027   RDW 14.8 12/27/2016 1125   LYMPHSABS 2.3 12/27/2016 1125   MONOABS 0.4 10/01/2016 1027   EOSABS 0.1 12/27/2016 1125   BASOSABS 0.0 12/27/2016 1125   Iron/TIBC/Ferritin/ %Sat No results found for: IRON, TIBC, FERRITIN, IRONPCTSAT Lipid  Panel     Component Value Date/Time   CHOL 153 02/04/2018 0757   TRIG 164 (H) 02/04/2018 0757   HDL 35 (L) 02/04/2018 0757   CHOLHDL 4 10/01/2016 1027   VLDL 33.8 10/01/2016 1027   LDLCALC 85 02/04/2018 0757   LDLDIRECT 93.3 04/02/2014 1632   Hepatic Function Panel     Component Value Date/Time   PROT 7.0 02/04/2018 0757   ALBUMIN 4.4 02/04/2018 0757   AST 19 02/04/2018 0757   ALT  14 02/04/2018 0757   ALKPHOS 62 02/04/2018 0757   BILITOT 0.9 02/04/2018 0757   BILIDIR 0.2 10/01/2016 1027      Component Value Date/Time   TSH 3.180 12/27/2016 1125   TSH 2.60 10/01/2016 1027   TSH 3.26 06/29/2016 1205  Results for ELLEN, MAYOL (MRN [id number]) as of 02/25/2018 17:30  Ref. Range 02/04/2018 07:57  Vitamin D, 25-Hydroxy Latest Ref Range: 30.0 - 100.0 ng/mL 36.9    ASSESSMENT AND PLAN: Vitamin D deficiency - Plan: Vitamin D, Ergocalciferol, (DRISDOL) 50000 units CAPS capsule  Type 2 diabetes mellitus without complication, without long-term current use of insulin (HCC) - Plan: glucose blood test strip, Lancets (ACCU-CHEK SAFE-T PRO) lancets  At risk for heart disease  Class 2 severe obesity with serious comorbidity and body mass index (BMI) of 36.0 to 36.9 in adult, unspecified obesity type ([place])  PLAN:  Vitamin D Deficiency Joel Medina was informed that low vitamin D levels contributes to fatigue and are associated with obesity, breast, and colon cancer. Joel Medina agrees to continue taking prescription Vit D @50 ,000 IU every week #4 and we will refill for 2 months. He will follow up for routine testing of vitamin D, at least 2-3 times per year. He was informed of the risk of over-replacement of vitamin D and agrees to not increase his dose unless he discusses this with [place] first. Joel Medina agrees to follow up with our clinic in 5 weeks.  Diabetes II Joel Medina has been given extensive diabetes education by myself today including ideal fasting and post-prandial blood glucose readings,  individual ideal Hgb A1c goals and hypoglycemia prevention. We discussed the importance of good blood sugar control to decrease the likelihood of diabetic complications such as nephropathy, neuropathy, limb loss, blindness, coronary artery disease, and death. We discussed the importance of intensive lifestyle modification including diet, exercise and weight loss as the first line treatment for diabetes. Joel Medina agrees to continue his diabetes medications and we will refill lancets and test strips for 90 days. Joel Medina agrees to follow up with our clinic in 5 weeks.  Cardiovascular risk counselling Joel Medina was given extended (15 minutes) coronary artery disease prevention counseling today. He is 57 y.o. male and has risk factors for heart disease including obesity and diabetes II. We discussed intensive lifestyle modifications today with an emphasis on specific weight loss instructions and strategies. Pt was also informed of the importance of increasing exercise and decreasing saturated fats to help prevent heart disease.  Obesity Joel Medina is currently in the action stage of change. As such, his goal is to continue with weight loss efforts He has agreed to follow the Category 4 plan Joel Medina has been instructed to work up to a goal of 150 minutes of combined cardio and strengthening exercise per week for weight loss and overall health benefits. We discussed the following Behavioral Modification Strategies today: increasing lean protein intake, decreasing simple carbohydrates  and holiday eating strategies    Joel Medina has agreed to follow up with our clinic in 5 weeks. He was informed of the importance of frequent follow up visits to maximize his success with intensive lifestyle modifications for his multiple health conditions.   OBESITY BEHAVIORAL INTERVENTION VISIT  Today's visit was # 27    Starting weight: 327 lbs Starting date: 12/27/16 Today's weight : 293 lbs  Today's date: 02/25/2018 Total lbs lost  to date: 69    ASK: We discussed the diagnosis of obesity with Joel Medina today and Joel Medina agreed to give [place]  permission to discuss obesity behavioral modification therapy today.  ASSESS: Joel Medina has the diagnosis of obesity and his BMI today is 36.62 Joel Medina is in the action stage of change   ADVISE: Joel Medina was educated on the multiple health risks of obesity as well as the benefit of weight loss to improve his health. He was advised of the need for long term treatment and the importance of lifestyle modifications to improve his current health and to decrease his risk of future health problems.  AGREE: Multiple dietary modification options and treatment options were discussed and  Joel Medina agreed to follow the recommendations documented in the above note.  ARRANGE: Joel Medina was educated on the importance of frequent visits to treat obesity as outlined per CMS and USPSTF guidelines and agreed to schedule his next follow up appointment today.  I, Trixie Dredge, am acting as transcriptionist for Dennard Nip, MD  I have reviewed the above documentation for accuracy and completeness, and I agree with the above. -Dennard Nip, MD

## 2018-04-01 ENCOUNTER — Ambulatory Visit (INDEPENDENT_AMBULATORY_CARE_PROVIDER_SITE_OTHER): Payer: 59 | Admitting: Family Medicine

## 2018-04-01 ENCOUNTER — Encounter (INDEPENDENT_AMBULATORY_CARE_PROVIDER_SITE_OTHER): Payer: Self-pay

## 2018-04-11 ENCOUNTER — Other Ambulatory Visit (INDEPENDENT_AMBULATORY_CARE_PROVIDER_SITE_OTHER): Payer: Self-pay | Admitting: Family Medicine

## 2018-04-11 DIAGNOSIS — E559 Vitamin D deficiency, unspecified: Secondary | ICD-10-CM

## 2018-04-17 ENCOUNTER — Other Ambulatory Visit (INDEPENDENT_AMBULATORY_CARE_PROVIDER_SITE_OTHER): Payer: Self-pay | Admitting: Family Medicine

## 2018-04-17 DIAGNOSIS — E119 Type 2 diabetes mellitus without complications: Secondary | ICD-10-CM

## 2018-07-04 ENCOUNTER — Other Ambulatory Visit: Payer: Self-pay | Admitting: Internal Medicine

## 2018-07-04 DIAGNOSIS — E119 Type 2 diabetes mellitus without complications: Secondary | ICD-10-CM

## 2018-07-30 ENCOUNTER — Ambulatory Visit (INDEPENDENT_AMBULATORY_CARE_PROVIDER_SITE_OTHER): Payer: 59

## 2018-07-30 ENCOUNTER — Ambulatory Visit: Payer: 59 | Admitting: Podiatry

## 2018-07-30 ENCOUNTER — Other Ambulatory Visit: Payer: Self-pay | Admitting: Podiatry

## 2018-07-30 DIAGNOSIS — M2011 Hallux valgus (acquired), right foot: Secondary | ICD-10-CM | POA: Diagnosis not present

## 2018-07-30 DIAGNOSIS — M79672 Pain in left foot: Secondary | ICD-10-CM | POA: Diagnosis not present

## 2018-07-30 DIAGNOSIS — M2012 Hallux valgus (acquired), left foot: Secondary | ICD-10-CM | POA: Diagnosis not present

## 2018-07-30 DIAGNOSIS — M79671 Pain in right foot: Secondary | ICD-10-CM

## 2018-07-30 NOTE — Patient Instructions (Addendum)
Bunion  A bunion is a bump on the base of the big toe that forms when the bones of the big toe joint move out of position. Bunions may be small at first, but they often get larger over time. They can make walking painful. What are the causes? A bunion may be caused by:  Wearing narrow or pointed shoes that force the big toe to press against the other toes.  Abnormal foot development that causes the foot to roll inward (pronate).  Changes in the foot that are caused by certain diseases, such as rheumatoid arthritis or polio.  A foot injury. What increases the risk? The following factors may make you more likely to develop this condition:  Wearing shoes that squeeze the toes together.  Having certain diseases, such as: ? Rheumatoid arthritis. ? Polio. ? Cerebral palsy.  Having family members who have bunions.  Being born with a foot deformity, such as flat feet or low arches.  Doing activities that put a lot of pressure on the feet, such as ballet dancing. What are the signs or symptoms? The main symptom of a bunion is a noticeable bump on the big toe. Other symptoms may include:  Pain.  Swelling around the big toe.  Redness and inflammation.  Thick or hardened skin on the big toe or between the toes.  Stiffness or loss of motion in the big toe.  Trouble with walking. How is this diagnosed? A bunion may be diagnosed based on your symptoms, medical history, and activities. You may have tests, such as:  X-rays. These allow your health care provider to check the position of the bones in your foot and look for damage to your joint. They also help your health care provider determine the severity of your bunion and the best way to treat it.  Joint aspiration. In this test, a sample of fluid is removed from the toe joint. This test may be done if you are in a lot of pain. It helps rule out diseases that cause painful swelling of the joints, such as arthritis. How is this  treated? Treatment depends on the severity of your symptoms. The goal of treatment is to relieve symptoms and prevent the bunion from getting worse. Your health care provider may recommend:  Wearing shoes that have a wide toe box.  Using bunion pads to cushion the affected area.  Taping your toes together to keep them in a normal position.  Placing a device inside your shoe (orthotics) to help reduce pressure on your toe joint.  Taking medicine to ease pain, inflammation, and swelling.  Applying heat or ice to the affected area.  Doing stretching exercises.  Surgery to remove scar tissue and move the toes back into their normal position. This treatment is rare. Follow these instructions at home: Managing pain, stiffness, and swelling   If directed, put ice on the painful area: ? Put ice in a plastic bag. ? Place a towel between your skin and the bag. ? Leave the ice on for 20 minutes, 2-3 times a day. Activity   If directed, apply heat to the affected area before you exercise. Use the heat source that your health care provider recommends, such as a moist heat pack or a heating pad. ? Place a towel between your skin and the heat source. ? Leave the heat on for 20-30 minutes. ? Remove the heat if your skin turns bright red. This is especially important if you are unable to feel pain,   heat, or cold. You may have a greater risk of getting burned.  Do exercises as told by your health care provider. General instructions  Support your toe joint with proper footwear, shoe padding, or taping as told by your health care provider.  Take over-the-counter and prescription medicines only as told by your health care provider.  Keep all follow-up visits as told by your health care provider. This is important. Contact a health care provider if your symptoms:  Get worse.  Do not improve in 2 weeks. Get help right away if you have:  Severe pain and trouble with walking. Summary  A  bunion is a bump on the base of the big toe that forms when the bones of the big toe joint move out of position.  Bunions can make walking painful.  Treatment depends on the severity of your symptoms.  Support your toe joint with proper footwear, shoe padding, or taping as told by your health care provider. This information is not intended to replace advice given to you by your health care provider. Make sure you discuss any questions you have with your health care provider. Document Released: 06/04/2005 Document Revised: 10/15/2017 Document Reviewed: 10/15/2017 Elsevier Interactive Patient Education  2019 [employer]   Pre-Operative Instructions  Congratulations, you have decided to take an important step towards improving your quality of life.  You can be assured that the doctors and staff at [facility] will be with you every step of the way.  Here are some important things you should know:  1. Plan to be at the surgery center/hospital at least 1 (one) hour prior to your scheduled time, unless otherwise directed by the surgical center/hospital staff.  You must have a responsible adult accompany you, remain during the surgery and drive you home.  Make sure you have directions to the surgical center/hospital to ensure you arrive on time. 2. If you are having surgery at [facility] or  hospitals, you will need a copy of your medical history and physical form from your family physician within one month prior to the date of surgery. We will give you a form for your primary physician to complete.  3. We make every effort to accommodate the date you request for surgery.  However, there are times where surgery dates or times have to be moved.  We will contact you as soon as possible if a change in schedule is required.   4. No aspirin/ibuprofen for one week before surgery.  If you are on aspirin, any non-steroidal anti-inflammatory medications (Mobic, Aleve, Ibuprofen) should not be  taken seven (7) days prior to your surgery.  You make take Tylenol for pain prior to surgery.  5. Medications - If you are taking daily heart and blood pressure medications, seizure, reflux, allergy, asthma, anxiety, pain or diabetes medications, make sure you notify the surgery center/hospital before the day of surgery so they can tell you which medications you should take or avoid the day of surgery. 6. No food or drink after midnight the night before surgery unless directed otherwise by surgical center/hospital staff. 7. No alcoholic beverages 24-hours prior to surgery.  No smoking 24-hours prior or 24-hours after surgery. 8. Wear loose pants or shorts. They should be loose enough to fit over bandages, boots, and casts. 9. Don't wear slip-on shoes. Sneakers are preferred. 10. Bring your boot with you to the surgery center/hospital.  Also bring crutches or a walker if your physician has prescribed it for   you.  If you do not have this equipment, it will be provided for you after surgery. 11. If you have not been contacted by the surgery center/hospital by the day before your surgery, call to confirm the date and time of your surgery. 12. Leave-time from work may vary depending on the type of surgery you have.  Appropriate arrangements should be made prior to surgery with your employer. 13. Prescriptions will be provided immediately following surgery by your doctor.  Fill these as soon as possible after surgery and take the medication as directed. Pain medications will not be refilled on weekends and must be approved by the doctor. 14. Remove nail polish on the operative foot and avoid getting pedicures prior to surgery. 15. Wash the night before surgery.  The night before surgery wash the foot and leg well with water and the antibacterial soap provided. Be sure to pay special attention to beneath the toenails and in between the toes.  Wash for at least three (3) minutes. Rinse thoroughly with water and  dry well with a towel.  Perform this wash unless told not to do so by your physician.  Enclosed: 1 Ice pack (please put in freezer the night before surgery)   1 Hibiclens skin cleaner   Pre-op instructions  If you have any questions regarding the instructions, please do not hesitate to call our office.  [place]: [address], [place], [place] [address] -- [phone]  [place]: [address] [place], [place] [address] -- [phone]  [place]: [address]  [place], [place] [address] -- [phone]  [place]: [address], [address], [place], [place] [address] -- [phone]  Website: [email or web address] 

## 2018-08-01 NOTE — Progress Notes (Signed)
Subjective:   Patient ID: Joel Medina, male   DOB: 58 y.o.   MRN: [id number]   HPI Patient presents with significant structural deformity of the left foot with bunion formation is becoming increasingly painful and pain on top of the foot that is become very sore.  Patient states his sugars been under good control and he had surgery on his right foot years ago and while is not hurting the alignment is not good.  Patient has a history of bone spur formation and states that this left foot is making shoe gear and possible.  Patient does not smoke likes to be active   Review of Systems  All other systems reviewed and are negative.       Objective:  Physical Exam Vitals signs and nursing note reviewed.  Constitutional:      Appearance: He is well-developed.  Pulmonary:     Effort: Pulmonary effort is normal.  Musculoskeletal: Normal range of motion.  Skin:    General: Skin is warm.  Neurological:     Mental Status: He is alert.     Neurovascular status intact muscle strength is adequate range of motion within normal limits with patient found to have redness and pain around the first metatarsal head left with irritation of the bone structure skin structure no drainage with a small keratotic lesion that is very painful.  Also has a bone spur formation on top of the metatarsocuneiform joint left that is very painful with patient having significant malalignment of both feet that is part of his natural structural position that is been this way for a long time.  Patient has good digital perfusion and is well oriented x3     Assessment:  Significant forefoot malalignment left with bunion formation and exostosis with most of the deformity being bone spur formation     Plan:  H&P x-rays reviewed condition discussed at great length.  Patient is concerned about the worsening of his situation and the fact that he needs to be very active later in the spring and would like to have this corrected  and I have recommended a relatively simple procedure consisting of removing of all bone spur formation medial side first metatarsal with mild correction of structural deformity with modified McBride bunionectomy and removal of tarsal exostosis left.  At this point I did allow him to go over consent form discussing all possible complications associated with this and the fact that there is no long-term guarantees and that we will not be correcting some of the increased intermetatarsal angle with that I am very hopeful that this simple procedure will solve his problem and allow him to be active.  Patient wants surgery and after extensive review signed consent form and understands total recovery can take upwards of 6 months and I reviewed at great length the consent form prior to him signing it.  Patient is comfortable with this and is encouraged to call with any questions prior to procedure and is scheduled for outpatient surgery  X-rays indicate there is significant bone spur formation around the first metatarsal left and right with small indications that there had been previous attempts at osteotomy surgery right with no indications of improvement of pathology.  Conveyed all results to patient

## 2018-08-12 ENCOUNTER — Encounter: Payer: Self-pay | Admitting: Podiatry

## 2018-08-12 DIAGNOSIS — M2012 Hallux valgus (acquired), left foot: Secondary | ICD-10-CM | POA: Diagnosis not present

## 2018-08-12 DIAGNOSIS — M898X7 Other specified disorders of bone, ankle and foot: Secondary | ICD-10-CM | POA: Diagnosis not present

## 2018-08-12 DIAGNOSIS — M25775 Osteophyte, left foot: Secondary | ICD-10-CM | POA: Diagnosis not present

## 2018-08-12 DIAGNOSIS — M21612 Bunion of left foot: Secondary | ICD-10-CM | POA: Diagnosis not present

## 2018-08-13 ENCOUNTER — Telehealth: Payer: Self-pay | Admitting: *Deleted

## 2018-08-13 NOTE — Telephone Encounter (Signed)
Called and left a message for the patient to call me back as I was calling to check on patient after having surgery with Dr Paulla Dolly on August 12, 2018 and stated to call me back at [phone]. Lattie Haw

## 2018-08-18 ENCOUNTER — Other Ambulatory Visit: Payer: Self-pay | Admitting: Internal Medicine

## 2018-08-18 DIAGNOSIS — I10 Essential (primary) hypertension: Secondary | ICD-10-CM

## 2018-08-19 ENCOUNTER — Telehealth: Payer: Self-pay | Admitting: Podiatry

## 2018-08-19 NOTE — Telephone Encounter (Signed)
Left message informing pt's wife, due to the importance of the instruction she would need to perform I would leave a message. I informed wife, the pt may be doing a great job of keeping the foot elevated but not to have too severe of a angle at the groin, only 6 inches above the hip or level with the hip at this time in recovery, may need to remove the boot, open-ended sock, and the ace wrap, then either elevate or put foot level with the hip for 15 minutes, after 15 minutes either way then make sure foot is level with the hip and rewrap the ace loosely beginning at the toes and roll up the leg, replace the boot and sock.

## 2018-08-19 NOTE — Telephone Encounter (Signed)
Left message requesting a call back to discuss the sensation going on with pt's surgery foot.

## 2018-08-19 NOTE — Telephone Encounter (Signed)
My husband had surgery last Tuesday and he's complaining of a pins and needles sensation going on. Is this normal? If you could please call me back.

## 2018-08-22 ENCOUNTER — Ambulatory Visit (INDEPENDENT_AMBULATORY_CARE_PROVIDER_SITE_OTHER): Payer: 59 | Admitting: Podiatry

## 2018-08-22 ENCOUNTER — Ambulatory Visit (INDEPENDENT_AMBULATORY_CARE_PROVIDER_SITE_OTHER): Payer: 59

## 2018-08-22 ENCOUNTER — Encounter: Payer: Self-pay | Admitting: Podiatry

## 2018-08-22 DIAGNOSIS — Z09 Encounter for follow-up examination after completed treatment for conditions other than malignant neoplasm: Secondary | ICD-10-CM | POA: Diagnosis not present

## 2018-08-22 DIAGNOSIS — M2012 Hallux valgus (acquired), left foot: Secondary | ICD-10-CM

## 2018-08-24 NOTE — Progress Notes (Signed)
Subjective:   Patient ID: Joel Medina, male   DOB: 58 y.o.   MRN: [id number]   HPI Patient states overall is doing well with minimal discomfort and able to walk without significant pain currently   ROS      Objective:  Physical Exam  Neurovascular status intact with patient's incision site left first metatarsal left midfoot healing well with good alignment and wound edges well coapted with significant reduction of deformity     Assessment:  Doing well post foot surgery left     Plan:  H&P condition reviewed and reapplied compression dressing and instructed on continued elevation compression immobilization and reappoint 3 weeks or earlier if needed  X-ray indicates satisfactory resection of bone

## 2018-09-05 ENCOUNTER — Ambulatory Visit (INDEPENDENT_AMBULATORY_CARE_PROVIDER_SITE_OTHER): Payer: 59

## 2018-09-05 ENCOUNTER — Encounter: Payer: Self-pay | Admitting: Podiatry

## 2018-09-05 ENCOUNTER — Ambulatory Visit (INDEPENDENT_AMBULATORY_CARE_PROVIDER_SITE_OTHER): Payer: 59 | Admitting: Podiatry

## 2018-09-05 ENCOUNTER — Other Ambulatory Visit: Payer: Self-pay

## 2018-09-05 DIAGNOSIS — M2012 Hallux valgus (acquired), left foot: Secondary | ICD-10-CM | POA: Diagnosis not present

## 2018-09-05 DIAGNOSIS — Z09 Encounter for follow-up examination after completed treatment for conditions other than malignant neoplasm: Secondary | ICD-10-CM

## 2018-09-07 NOTE — Progress Notes (Signed)
Subjective:   Patient ID: Joel Medina, male   DOB: 58 y.o.   MRN: [id number]   HPI Patient states I am doing really well and my foot feels great and very happy with the surgery   ROS      Objective:  Physical Exam  Neurovascular status intact with patient's left foot doing well after having had a McBride type bunionectomy and tarsal exostectomy.  Wound edges well coapted negative Homans sign noted     Assessment:  Doing well post foot surgery left with good correction of the underlying deformity     Plan:  H&P condition reviewed and at this point I have recommended gradual return to soft shoe gear elevation to be continued and dispensed a compression stocking.  Reappoint as needed  X-ray indicates that satisfactory bone resection has occurred with good alignment and no other signs of pathology

## 2018-09-14 ENCOUNTER — Emergency Department (HOSPITAL_COMMUNITY)
Admission: EM | Admit: 2018-09-14 | Discharge: 2018-09-14 | Disposition: A | Discharge status: Home / Self Care | Payer: 59 | Attending: Emergency Medicine | Admitting: Emergency Medicine

## 2018-09-14 ENCOUNTER — Emergency Department (HOSPITAL_COMMUNITY): Payer: 59

## 2018-09-14 ENCOUNTER — Encounter (HOSPITAL_COMMUNITY): Payer: Self-pay

## 2018-09-14 ENCOUNTER — Other Ambulatory Visit: Payer: Self-pay

## 2018-09-14 DIAGNOSIS — Z79899 Other long term (current) drug therapy: Secondary | ICD-10-CM | POA: Diagnosis not present

## 2018-09-14 DIAGNOSIS — Y939 Activity, unspecified: Secondary | ICD-10-CM | POA: Diagnosis not present

## 2018-09-14 DIAGNOSIS — S2242XA Multiple fractures of ribs, left side, initial encounter for closed fracture: Secondary | ICD-10-CM | POA: Insufficient documentation

## 2018-09-14 DIAGNOSIS — S2249XS Multiple fractures of ribs, unspecified side, sequela: Secondary | ICD-10-CM

## 2018-09-14 DIAGNOSIS — Y999 Unspecified external cause status: Secondary | ICD-10-CM | POA: Insufficient documentation

## 2018-09-14 DIAGNOSIS — S42102A Fracture of unspecified part of scapula, left shoulder, initial encounter for closed fracture: Secondary | ICD-10-CM | POA: Insufficient documentation

## 2018-09-14 DIAGNOSIS — E119 Type 2 diabetes mellitus without complications: Secondary | ICD-10-CM | POA: Insufficient documentation

## 2018-09-14 DIAGNOSIS — Z794 Long term (current) use of insulin: Secondary | ICD-10-CM | POA: Insufficient documentation

## 2018-09-14 DIAGNOSIS — I10 Essential (primary) hypertension: Secondary | ICD-10-CM | POA: Insufficient documentation

## 2018-09-14 DIAGNOSIS — S299XXA Unspecified injury of thorax, initial encounter: Secondary | ICD-10-CM | POA: Diagnosis present

## 2018-09-14 DIAGNOSIS — Z96643 Presence of artificial hip joint, bilateral: Secondary | ICD-10-CM | POA: Insufficient documentation

## 2018-09-14 DIAGNOSIS — Y92410 Unspecified street and highway as the place of occurrence of the external cause: Secondary | ICD-10-CM | POA: Insufficient documentation

## 2018-09-14 DIAGNOSIS — Z23 Encounter for immunization: Secondary | ICD-10-CM | POA: Diagnosis not present

## 2018-09-14 DIAGNOSIS — S42192A Fracture of other part of scapula, left shoulder, initial encounter for closed fracture: Secondary | ICD-10-CM | POA: Diagnosis not present

## 2018-09-14 LAB — CBC WITH DIFFERENTIAL/PLATELET
Abs Immature Granulocytes: 0.13 10*3/uL — ABNORMAL HIGH (ref 0.00–0.07)
Basophils Absolute: 0.1 10*3/uL (ref 0.0–0.1)
Basophils Relative: 0 %
Eosinophils Absolute: 0 10*3/uL (ref 0.0–0.5)
Eosinophils Relative: 0 %
HCT: 45.5 % (ref 39.0–52.0)
Hemoglobin: 15.5 g/dL (ref 13.0–17.0)
Immature Granulocytes: 1 %
Lymphocytes Relative: 12 %
Lymphs Abs: 1.7 10*3/uL (ref 0.7–4.0)
MCH: 33.1 pg (ref 26.0–34.0)
MCHC: 34.1 g/dL (ref 30.0–36.0)
MCV: 97.2 fL (ref 80.0–100.0)
Monocytes Absolute: 1 10*3/uL (ref 0.1–1.0)
Monocytes Relative: 7 %
Neutro Abs: 11.5 10*3/uL — ABNORMAL HIGH (ref 1.7–7.7)
Neutrophils Relative %: 80 %
Platelets: 177 10*3/uL (ref 150–400)
RBC: 4.68 MIL/uL (ref 4.22–5.81)
RDW: 13.7 % (ref 11.5–15.5)
WBC: 14.4 10*3/uL — ABNORMAL HIGH (ref 4.0–10.5)
nRBC: 0 % (ref 0.0–0.2)

## 2018-09-14 LAB — BASIC METABOLIC PANEL
Anion gap: 11 (ref 5–15)
BUN: 23 mg/dL — ABNORMAL HIGH (ref 6–20)
CO2: 22 mmol/L (ref 22–32)
Calcium: 8.9 mg/dL (ref 8.9–10.3)
Chloride: 103 mmol/L (ref 98–111)
Creatinine, Ser: 1.11 mg/dL (ref 0.61–1.24)
GFR calc Af Amer: >60 mL/min (ref >60)
Glucose, Bld: 158 mg/dL — ABNORMAL HIGH (ref 70–99)
Potassium: 3.9 mmol/L (ref 3.5–5.1)
SODIUM: 136 mmol/L (ref 135–145)

## 2018-09-14 MED ORDER — OXYCODONE-ACETAMINOPHEN 5-325 MG PO TABS: 2.0000 | ORAL_TABLET | ORAL | 0 refills | Status: DC | PRN

## 2018-09-14 MED ORDER — ONDANSETRON HCL 4 MG/2ML IJ SOLN
4.0000 mg | Freq: Once | INTRAMUSCULAR | Status: AC
  Administered 2018-09-14: 4 mg via INTRAVENOUS
  Filled 2018-09-14: qty 2

## 2018-09-14 MED ORDER — METHOCARBAMOL 750 MG PO TABS: 750.0000 mg | ORAL_TABLET | Freq: Four times a day (QID) | ORAL | 0 refills | Status: DC

## 2018-09-14 MED ORDER — TETANUS-DIPHTHERIA TOXOIDS TD 5-2 LFU IM INJ
0.5000 mL | INJECTION | Freq: Once | INTRAMUSCULAR | Status: AC
  Administered 2018-09-14: 0.5 mL via INTRAMUSCULAR
  Filled 2018-09-14: qty 0.5

## 2018-09-14 MED ORDER — MORPHINE SULFATE (PF) 4 MG/ML IV SOLN
6.0000 mg | Freq: Once | INTRAVENOUS | Status: AC
  Administered 2018-09-14: 6 mg via INTRAVENOUS
  Filled 2018-09-14: qty 2

## 2018-09-14 MED ORDER — IOHEXOL 300 MG/ML  SOLN
75.0000 mL | Freq: Once | INTRAMUSCULAR | Status: AC | PRN
  Administered 2018-09-14: 75 mL via INTRAVENOUS

## 2018-09-14 MED ORDER — SODIUM CHLORIDE (PF) 0.9 % IJ SOLN
INTRAMUSCULAR | Status: AC
  Filled 2018-09-14: qty 50

## 2018-09-14 NOTE — ED Notes (Signed)
Patient transported to X-ray 

## 2018-09-14 NOTE — Discharge Instructions (Addendum)
Return here for severe pain, trouble breathing, or any other problems
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
## 2018-09-14 NOTE — ED Notes (Signed)
Pt wife contact info: Fynn Vanblarcom [phone]
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
## 2018-09-14 NOTE — ED Notes (Signed)
Patient transported to CT 

## 2018-09-14 NOTE — ED Notes (Signed)
Pt returned from Xray at this time  

## 2018-09-14 NOTE — ED Notes (Signed)
Pt given and verbalized understanding of d/c instructions and need for follow up with pcp. Educated on Rx and how to take them together and avoid overdosing. Told to return if s/s worsen. No further distress or questions at this time. Pt able to ambulate to bathroom upon d/c and then wheeled out.

## 2019-01-06 ENCOUNTER — Other Ambulatory Visit (INDEPENDENT_AMBULATORY_CARE_PROVIDER_SITE_OTHER): Payer: Self-pay | Admitting: Family Medicine

## 2019-01-06 DIAGNOSIS — E119 Type 2 diabetes mellitus without complications: Secondary | ICD-10-CM

## 2019-01-27 ENCOUNTER — Other Ambulatory Visit (INDEPENDENT_AMBULATORY_CARE_PROVIDER_SITE_OTHER): Payer: Self-pay | Admitting: Internal Medicine

## 2019-01-27 DIAGNOSIS — E119 Type 2 diabetes mellitus without complications: Secondary | ICD-10-CM

## 2019-02-15 ENCOUNTER — Other Ambulatory Visit: Payer: Self-pay | Admitting: Internal Medicine

## 2019-02-15 DIAGNOSIS — E119 Type 2 diabetes mellitus without complications: Secondary | ICD-10-CM

## 2019-02-16 ENCOUNTER — Encounter: Payer: 59 | Admitting: Internal Medicine

## 2019-02-18 ENCOUNTER — Encounter: Payer: 59 | Admitting: Internal Medicine

## 2019-03-06 ENCOUNTER — Other Ambulatory Visit: Payer: Self-pay | Admitting: Internal Medicine

## 2019-03-10 ENCOUNTER — Encounter: Payer: Self-pay | Admitting: Internal Medicine

## 2019-03-10 ENCOUNTER — Other Ambulatory Visit: Payer: Self-pay

## 2019-03-10 ENCOUNTER — Other Ambulatory Visit (INDEPENDENT_AMBULATORY_CARE_PROVIDER_SITE_OTHER): Payer: 59

## 2019-03-10 ENCOUNTER — Ambulatory Visit (INDEPENDENT_AMBULATORY_CARE_PROVIDER_SITE_OTHER): Payer: 59 | Admitting: Internal Medicine

## 2019-03-10 VITALS — BP 126/80 | HR 79 | Temp 98.2°F | Ht 75.0 in | Wt 327.0 lb

## 2019-03-10 DIAGNOSIS — E559 Vitamin D deficiency, unspecified: Secondary | ICD-10-CM

## 2019-03-10 DIAGNOSIS — Z0000 Encounter for general adult medical examination without abnormal findings: Secondary | ICD-10-CM

## 2019-03-10 DIAGNOSIS — E119 Type 2 diabetes mellitus without complications: Secondary | ICD-10-CM

## 2019-03-10 DIAGNOSIS — E1169 Type 2 diabetes mellitus with other specified complication: Secondary | ICD-10-CM

## 2019-03-10 DIAGNOSIS — I10 Essential (primary) hypertension: Secondary | ICD-10-CM | POA: Diagnosis not present

## 2019-03-10 DIAGNOSIS — Z125 Encounter for screening for malignant neoplasm of prostate: Secondary | ICD-10-CM | POA: Diagnosis not present

## 2019-03-10 DIAGNOSIS — E669 Obesity, unspecified: Secondary | ICD-10-CM

## 2019-03-10 DIAGNOSIS — Z23 Encounter for immunization: Secondary | ICD-10-CM

## 2019-03-10 DIAGNOSIS — N529 Male erectile dysfunction, unspecified: Secondary | ICD-10-CM

## 2019-03-10 LAB — BASIC METABOLIC PANEL
BUN: 14 mg/dL (ref 6–23)
CO2: 28 mEq/L (ref 19–32)
Calcium: 10 mg/dL (ref 8.4–10.5)
Chloride: 98 mEq/L (ref 96–112)
Creatinine, Ser: 0.91 mg/dL (ref 0.40–1.50)
GFR: 85.68 mL/min (ref >60.00)
Glucose, Bld: 92 mg/dL (ref 70–99)
Potassium: 4.2 mEq/L (ref 3.5–5.1)
Sodium: 134 mEq/L — ABNORMAL LOW (ref 135–145)

## 2019-03-10 LAB — HEMOGLOBIN A1C: Hgb A1c MFr Bld: 6 % (ref 4.6–6.5)

## 2019-03-10 LAB — LDL CHOLESTEROL, DIRECT: Direct LDL: 93 mg/dL

## 2019-03-10 LAB — CBC WITH DIFFERENTIAL/PLATELET
Basophils Absolute: 0.1 10*3/uL (ref 0.0–0.1)
Basophils Relative: 1.1 % (ref 0.0–3.0)
Eosinophils Absolute: 0.1 10*3/uL (ref 0.0–0.7)
Eosinophils Relative: 1 % (ref 0.0–5.0)
HCT: 45.6 % (ref 39.0–52.0)
Hemoglobin: 15.9 g/dL (ref 13.0–17.0)
Lymphocytes Relative: 36 % (ref 12.0–46.0)
Lymphs Abs: 2.3 10*3/uL (ref 0.7–4.0)
MCHC: 35 g/dL (ref 30.0–36.0)
MCV: 95.4 fl (ref 78.0–100.0)
Monocytes Absolute: 0.4 10*3/uL (ref 0.1–1.0)
Monocytes Relative: 6.7 % (ref 3.0–12.0)
Neutro Abs: 3.6 10*3/uL (ref 1.4–7.7)
Neutrophils Relative %: 55.2 % (ref 43.0–77.0)
Platelets: 196 10*3/uL (ref 150.0–400.0)
RBC: 4.78 Mil/uL (ref 4.22–5.81)
RDW: 14 % (ref 11.5–15.5)
WBC: 6.4 10*3/uL (ref 4.0–10.5)

## 2019-03-10 LAB — HEPATIC FUNCTION PANEL
ALT: 19 U/L (ref 0–53)
AST: 17 U/L (ref 0–37)
Albumin: 4.5 g/dL (ref 3.5–5.2)
Alkaline Phosphatase: 66 U/L (ref 39–117)
Bilirubin, Direct: 0.2 mg/dL (ref 0.0–0.3)
Total Bilirubin: 1.1 mg/dL (ref 0.2–1.2)
Total Protein: 7.2 g/dL (ref 6.0–8.3)

## 2019-03-10 LAB — URINALYSIS
Bilirubin Urine: NEGATIVE
Hgb urine dipstick: NEGATIVE
Ketones, ur: NEGATIVE
Leukocytes,Ua: NEGATIVE
Nitrite: NEGATIVE
Specific Gravity, Urine: 1.02 (ref 1.000–1.030)
Total Protein, Urine: NEGATIVE
Urine Glucose: NEGATIVE
Urobilinogen, UA: 0.2 (ref 0.0–1.0)
pH: 6 (ref 5.0–8.0)

## 2019-03-10 LAB — LIPID PANEL
Cholesterol: 156 mg/dL (ref 0–200)
HDL: 33.1 mg/dL — ABNORMAL LOW (ref >39.00)
NonHDL: 122.96
Total CHOL/HDL Ratio: 5
Triglycerides: 206 mg/dL — ABNORMAL HIGH (ref 0.0–149.0)
VLDL: 41.2 mg/dL — ABNORMAL HIGH (ref 0.0–40.0)

## 2019-03-10 LAB — TSH: TSH: 3.71 u[IU]/mL (ref 0.35–4.50)

## 2019-03-10 LAB — MICROALBUMIN / CREATININE URINE RATIO
Creatinine,U: 109.4 mg/dL
Microalb Creat Ratio: 0.6 mg/g (ref 0.0–30.0)
Microalb, Ur: <0.7 mg/dL (ref 0.0–1.9)

## 2019-03-10 LAB — PSA: PSA: 1.1 ng/mL (ref 0.10–4.00)

## 2019-03-10 MED ORDER — METFORMIN HCL 1000 MG PO TABS: ORAL_TABLET | ORAL | 3 refills | Status: DC

## 2019-03-10 MED ORDER — TELMISARTAN 80 MG PO TABS: 80.0000 mg | ORAL_TABLET | Freq: Every day | ORAL | 3 refills | Status: DC

## 2019-03-10 MED ORDER — TRIAMTERENE-HCTZ 37.5-25 MG PO TABS: 1.0000 | ORAL_TABLET | Freq: Every day | ORAL | 3 refills | Status: DC

## 2019-03-10 MED ORDER — RYBELSUS 7 MG PO TABS: 1.0000 | ORAL_TABLET | Freq: Every day | ORAL | 11 refills | Status: DC

## 2019-03-10 MED ORDER — TADALAFIL 20 MG PO TABS: 20.0000 mg | ORAL_TABLET | Freq: Every day | ORAL | 5 refills | Status: DC | PRN

## 2019-03-10 NOTE — Assessment & Plan Note (Signed)
We discussed age appropriate health related issues, including available/recomended screening tests and vaccinations. We discussed a need for adhering to healthy diet and exercise. Labs were ordered to be later reviewed . All questions were answered. Colon 2016, due 2026

## 2019-03-10 NOTE — Assessment & Plan Note (Signed)
Vit D 
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
## 2019-03-10 NOTE — Assessment & Plan Note (Signed)
A1c

## 2019-03-10 NOTE — Assessment & Plan Note (Signed)
On Metformin  Victoza

## 2019-03-10 NOTE — Patient Instructions (Signed)

## 2019-03-10 NOTE — Assessment & Plan Note (Signed)
BP Readings from Last 3 Encounters:  03/10/19 126/80  09/14/18 122/76  02/25/18 107/69

## 2019-03-11 ENCOUNTER — Telehealth: Payer: Self-pay | Admitting: Internal Medicine

## 2019-03-11 NOTE — Telephone Encounter (Signed)
Patient's wife requesting call back from CMA to discuss the possibility of taking trulicity instead of sample given at office yesterday. Patient may be contacted directly.

## 2019-03-12 NOTE — Telephone Encounter (Signed)
Reviewed medication with patient and he will try medication and call back with any concerns

## 2019-03-12 NOTE — Telephone Encounter (Signed)
Patient returning call to Joel Medina- stating that he also had some questions regarding the Semaglutide (RYBELSUS) 7 MG TABS . Attempted office x2. When switching back to patient to inform him, he was no longer on the line. Please advise.

## 2019-03-12 NOTE — Telephone Encounter (Signed)
LMTCB

## 2019-05-28 ENCOUNTER — Other Ambulatory Visit: Payer: Self-pay

## 2019-05-28 DIAGNOSIS — Z20822 Contact with and (suspected) exposure to covid-19: Secondary | ICD-10-CM

## 2019-05-31 LAB — NOVEL CORONAVIRUS, NAA: SARS-CoV-2, NAA: NOT DETECTED

## 2019-06-09 ENCOUNTER — Ambulatory Visit: Payer: 59 | Admitting: Internal Medicine

## 2019-06-17 ENCOUNTER — Encounter: Payer: Self-pay | Admitting: Internal Medicine

## 2019-06-17 ENCOUNTER — Other Ambulatory Visit: Payer: Self-pay

## 2019-06-17 ENCOUNTER — Ambulatory Visit (INDEPENDENT_AMBULATORY_CARE_PROVIDER_SITE_OTHER): Payer: 59 | Admitting: Internal Medicine

## 2019-06-17 DIAGNOSIS — E119 Type 2 diabetes mellitus without complications: Secondary | ICD-10-CM

## 2019-06-17 DIAGNOSIS — E559 Vitamin D deficiency, unspecified: Secondary | ICD-10-CM

## 2019-06-17 DIAGNOSIS — I2510 Atherosclerotic heart disease of native coronary artery without angina pectoris: Secondary | ICD-10-CM

## 2019-06-17 DIAGNOSIS — I2583 Coronary atherosclerosis due to lipid rich plaque: Secondary | ICD-10-CM

## 2019-06-17 DIAGNOSIS — I10 Essential (primary) hypertension: Secondary | ICD-10-CM | POA: Diagnosis not present

## 2019-06-17 DIAGNOSIS — Z0000 Encounter for general adult medical examination without abnormal findings: Secondary | ICD-10-CM

## 2019-06-17 DIAGNOSIS — E669 Obesity, unspecified: Secondary | ICD-10-CM

## 2019-06-17 DIAGNOSIS — E1169 Type 2 diabetes mellitus with other specified complication: Secondary | ICD-10-CM | POA: Diagnosis not present

## 2019-06-17 DIAGNOSIS — N401 Enlarged prostate with lower urinary tract symptoms: Secondary | ICD-10-CM

## 2019-06-17 MED ORDER — TADALAFIL 20 MG PO TABS: 20.0000 mg | ORAL_TABLET | Freq: Every day | ORAL | 5 refills | Status: DC | PRN

## 2019-06-17 MED ORDER — RYBELSUS 14 MG PO TABS: 1.0000 | ORAL_TABLET | Freq: Every day | ORAL | 11 refills | Status: DC

## 2019-06-17 NOTE — Assessment & Plan Note (Signed)
Micardis, Maxzide

## 2019-06-17 NOTE — Assessment & Plan Note (Signed)
Vit D def 

## 2019-06-17 NOTE — Assessment & Plan Note (Signed)
1-2 per night

## 2019-06-17 NOTE — Patient Instructions (Signed)

## 2019-06-17 NOTE — Assessment & Plan Note (Signed)
On Metformin  Victoza
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
## 2019-06-17 NOTE — Assessment & Plan Note (Signed)
ASA Statin adviced

## 2019-06-17 NOTE — Assessment & Plan Note (Signed)
Labs

## 2019-08-04 IMAGING — CR LEFT RIBS AND CHEST - 3+ VIEW
5 series · 5 of 5 positions shown · non-contrast
Comparison: January 28, 2013

CLINICAL DATA: Pain after trauma

EXAM:
LEFT RIBS AND CHEST - 3+ VIEW

[x chest ap (1 of 5)]
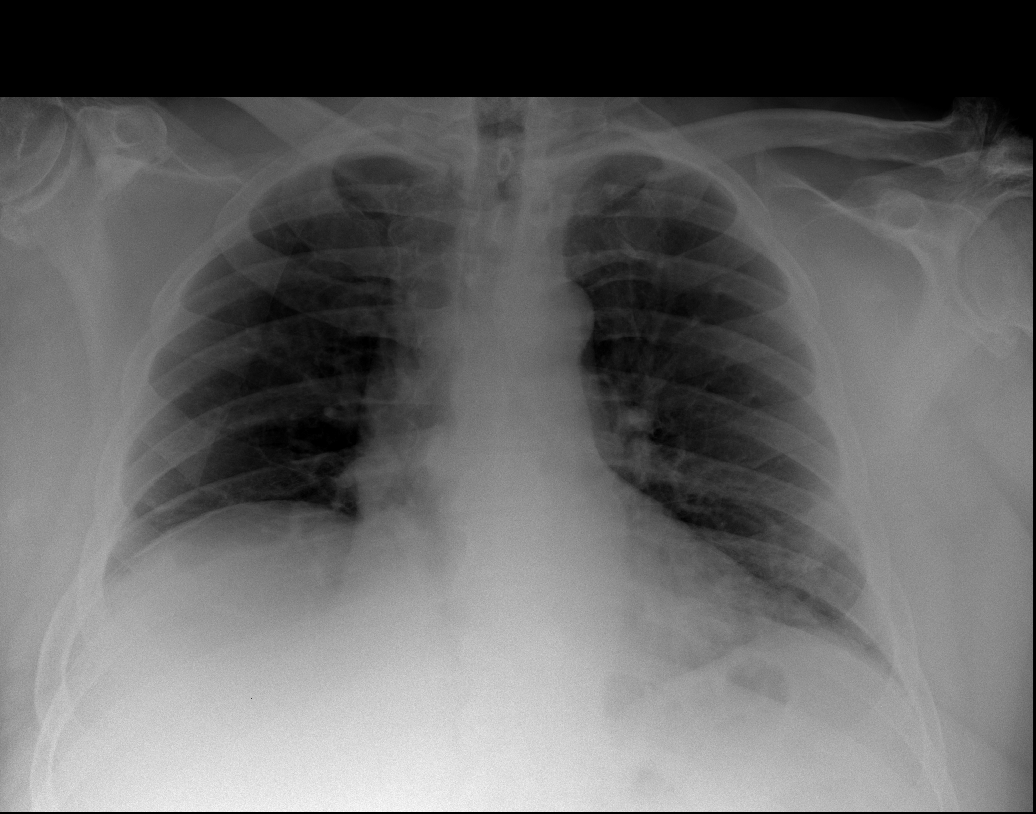

[x chest ap (2 of 5)]
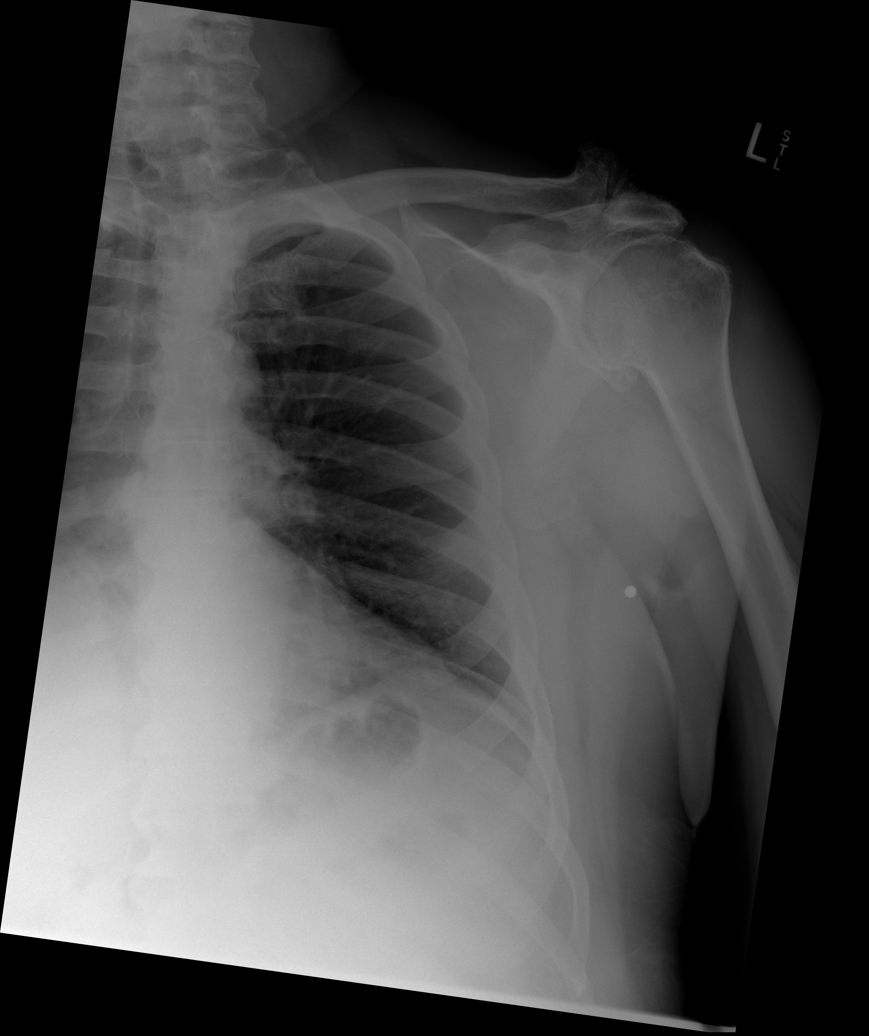

[x chest ap (3 of 5)]
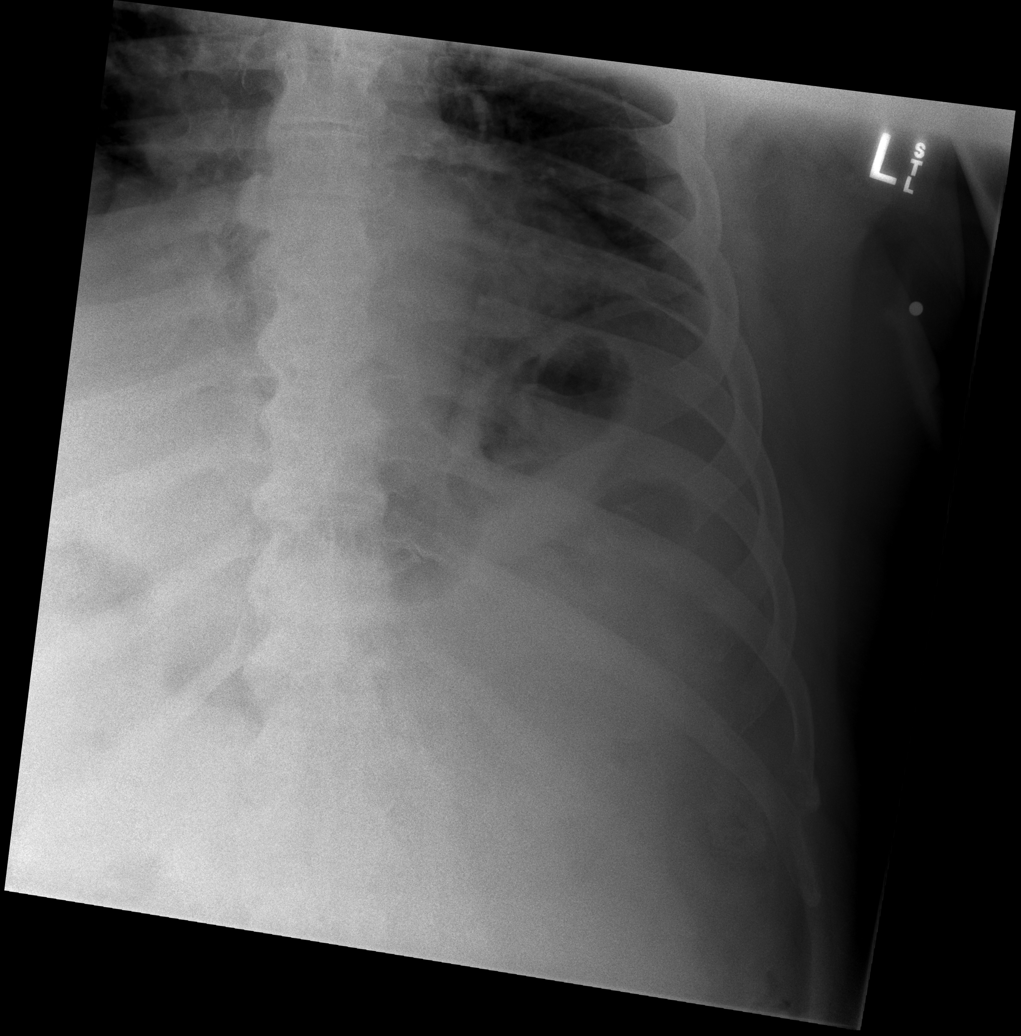

[x chest ap (4 of 5)]
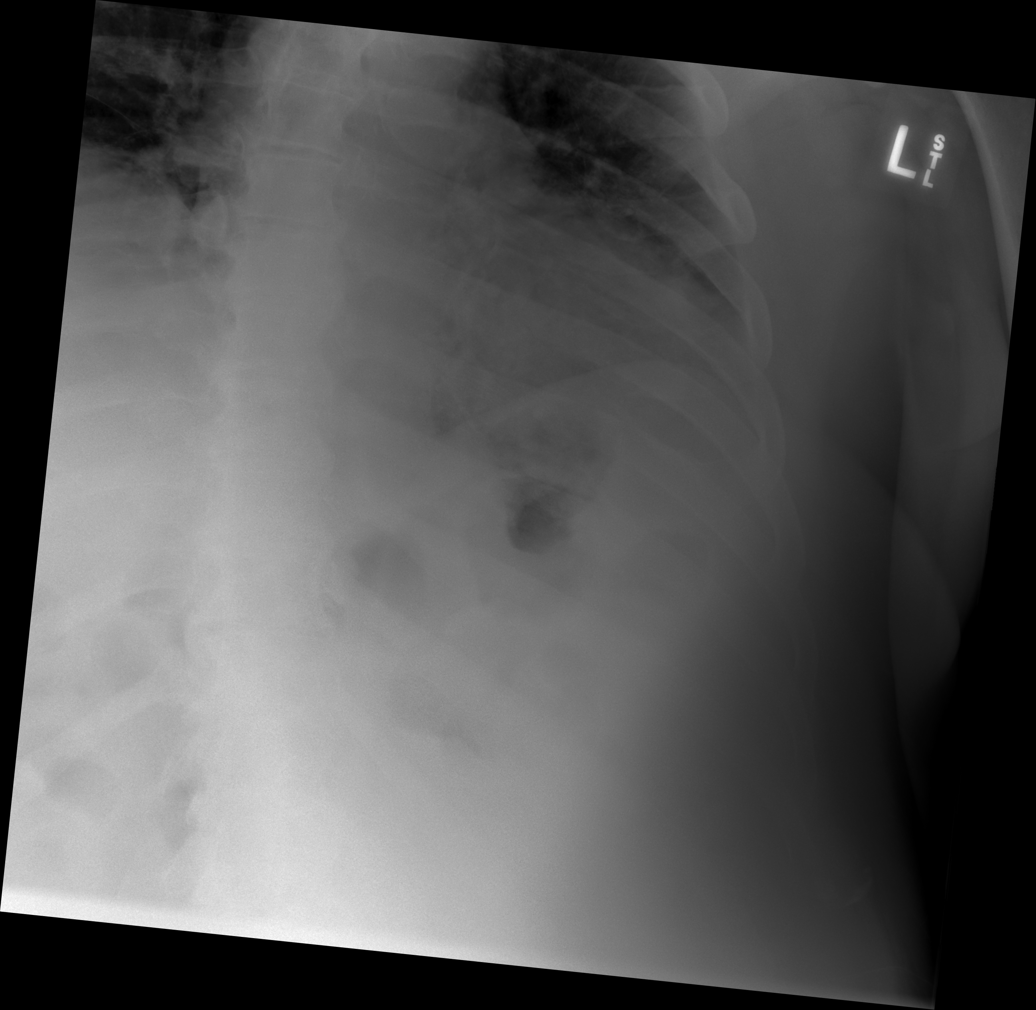

[x chest ap (5 of 5)]
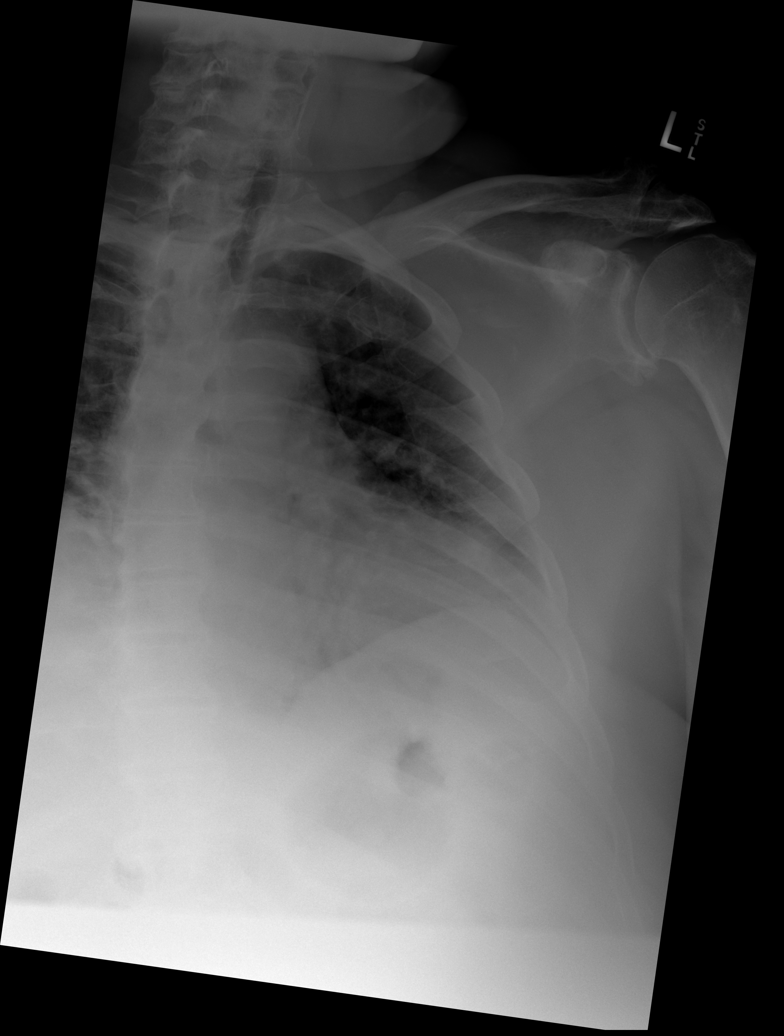

[5 of 5 positions shown; findings below may reference images not displayed]

FINDINGS: The heart, hila, and mediastinum are normal. No pneumothorax.
Degenerative changes in the bilateral AC joints and glenohumeral
joints. The inferior scapular fractures again noted. No rib
fractures are seen.
IMPRESSION: 1. Inferior scapular fracture.
2. No rib fractures or pneumothorax.

## 2019-12-14 ENCOUNTER — Ambulatory Visit: Payer: 59 | Admitting: Internal Medicine

## 2019-12-16 ENCOUNTER — Encounter: Payer: Self-pay | Admitting: Internal Medicine

## 2019-12-16 ENCOUNTER — Other Ambulatory Visit: Payer: Self-pay

## 2019-12-16 ENCOUNTER — Ambulatory Visit: Payer: 59 | Admitting: Internal Medicine

## 2019-12-16 VITALS — BP 132/90 | HR 77 | Temp 98.3°F | Ht 75.0 in | Wt 323.0 lb

## 2019-12-16 DIAGNOSIS — E119 Type 2 diabetes mellitus without complications: Secondary | ICD-10-CM

## 2019-12-16 DIAGNOSIS — Z23 Encounter for immunization: Secondary | ICD-10-CM

## 2019-12-16 DIAGNOSIS — E1169 Type 2 diabetes mellitus with other specified complication: Secondary | ICD-10-CM

## 2019-12-16 DIAGNOSIS — E559 Vitamin D deficiency, unspecified: Secondary | ICD-10-CM | POA: Diagnosis not present

## 2019-12-16 DIAGNOSIS — I10 Essential (primary) hypertension: Secondary | ICD-10-CM

## 2019-12-16 DIAGNOSIS — R739 Hyperglycemia, unspecified: Secondary | ICD-10-CM

## 2019-12-16 DIAGNOSIS — E669 Obesity, unspecified: Secondary | ICD-10-CM

## 2019-12-16 LAB — BASIC METABOLIC PANEL
BUN: 19 mg/dL (ref 6–23)
CO2: 26 mEq/L (ref 19–32)
Calcium: 9.6 mg/dL (ref 8.4–10.5)
Chloride: 100 mEq/L (ref 96–112)
Creatinine, Ser: 0.95 mg/dL (ref 0.40–1.50)
GFR: 81.31 mL/min (ref >60.00)
Glucose, Bld: 113 mg/dL — ABNORMAL HIGH (ref 70–99)
Potassium: 4 mEq/L (ref 3.5–5.1)
Sodium: 134 mEq/L — ABNORMAL LOW (ref 135–145)

## 2019-12-16 LAB — HEMOGLOBIN A1C: Hgb A1c MFr Bld: 5.7 % (ref 4.6–6.5)

## 2019-12-16 MED ORDER — RYBELSUS 14 MG PO TABS: 1.0000 | ORAL_TABLET | Freq: Every day | ORAL | 3 refills | Status: DC

## 2019-12-16 MED ORDER — GLUCOSE BLOOD VI STRP: ORAL_STRIP | 5 refills | Status: DC

## 2019-12-16 MED ORDER — ACCU-CHEK SAFE-T PRO LANCETS MISC: 5 refills | Status: AC

## 2019-12-16 MED ORDER — METFORMIN HCL 1000 MG PO TABS: ORAL_TABLET | ORAL | 3 refills | Status: DC

## 2019-12-16 MED ORDER — TELMISARTAN 80 MG PO TABS: 80.0000 mg | ORAL_TABLET | Freq: Every day | ORAL | 3 refills | Status: DC

## 2019-12-16 MED ORDER — TRIAMTERENE-HCTZ 37.5-25 MG PO TABS: 1.0000 | ORAL_TABLET | Freq: Every day | ORAL | 3 refills | Status: DC

## 2019-12-16 MED ORDER — TADALAFIL 20 MG PO TABS: 20.0000 mg | ORAL_TABLET | Freq: Every day | ORAL | 11 refills | Status: DC | PRN

## 2019-12-16 NOTE — Assessment & Plan Note (Signed)
Labs

## 2019-12-16 NOTE — Addendum Note (Signed)
Addended by: Trenda Moots on: 10/29/6045 09:52 AM   Modules accepted: Orders

## 2019-12-16 NOTE — Assessment & Plan Note (Signed)
Metformin Rybelsus

## 2019-12-16 NOTE — Assessment & Plan Note (Signed)
Vit D 

## 2019-12-16 NOTE — Addendum Note (Signed)
Addended by: Darlys Gales on: 12/16/2019 09:51 AM   Modules accepted: Orders

## 2019-12-16 NOTE — Assessment & Plan Note (Signed)
On Rybelsus 
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
## 2020-06-15 ENCOUNTER — Other Ambulatory Visit: Payer: Self-pay

## 2020-06-16 ENCOUNTER — Encounter: Payer: Self-pay | Admitting: Internal Medicine

## 2020-06-16 ENCOUNTER — Ambulatory Visit (INDEPENDENT_AMBULATORY_CARE_PROVIDER_SITE_OTHER): Payer: 59 | Admitting: Internal Medicine

## 2020-06-16 VITALS — BP 124/80 | HR 80 | Temp 98.0°F | Ht 75.0 in | Wt 322.0 lb

## 2020-06-16 DIAGNOSIS — Z0000 Encounter for general adult medical examination without abnormal findings: Secondary | ICD-10-CM

## 2020-06-16 DIAGNOSIS — E119 Type 2 diabetes mellitus without complications: Secondary | ICD-10-CM

## 2020-06-16 DIAGNOSIS — I10 Essential (primary) hypertension: Secondary | ICD-10-CM

## 2020-06-16 DIAGNOSIS — Z23 Encounter for immunization: Secondary | ICD-10-CM

## 2020-06-16 LAB — CBC WITH DIFFERENTIAL/PLATELET
Basophils Absolute: 0.1 10*3/uL (ref 0.0–0.1)
Basophils Relative: 0.9 % (ref 0.0–3.0)
Eosinophils Absolute: 0.1 10*3/uL (ref 0.0–0.7)
Eosinophils Relative: 0.7 % (ref 0.0–5.0)
HCT: 46.5 % (ref 39.0–52.0)
Hemoglobin: 16.2 g/dL (ref 13.0–17.0)
Lymphocytes Relative: 36.4 % (ref 12.0–46.0)
Lymphs Abs: 2.7 10*3/uL (ref 0.7–4.0)
MCHC: 34.9 g/dL (ref 30.0–36.0)
MCV: 94.5 fl (ref 78.0–100.0)
Monocytes Absolute: 0.4 10*3/uL (ref 0.1–1.0)
Monocytes Relative: 6 % (ref 3.0–12.0)
Neutro Abs: 4.2 10*3/uL (ref 1.4–7.7)
Neutrophils Relative %: 56 % (ref 43.0–77.0)
Platelets: 193 10*3/uL (ref 150.0–400.0)
RBC: 4.92 Mil/uL (ref 4.22–5.81)
RDW: 14 % (ref 11.5–15.5)
WBC: 7.5 10*3/uL (ref 4.0–10.5)

## 2020-06-16 LAB — URINALYSIS
Bilirubin Urine: NEGATIVE
Hgb urine dipstick: NEGATIVE
Ketones, ur: NEGATIVE
Leukocytes,Ua: NEGATIVE
Nitrite: NEGATIVE
Specific Gravity, Urine: 1.02 (ref 1.000–1.030)
Total Protein, Urine: NEGATIVE
Urine Glucose: NEGATIVE
Urobilinogen, UA: 0.2 (ref 0.0–1.0)
pH: 6 (ref 5.0–8.0)

## 2020-06-16 LAB — COMPREHENSIVE METABOLIC PANEL
ALT: 23 U/L (ref 0–53)
AST: 20 U/L (ref 0–37)
Albumin: 4.8 g/dL (ref 3.5–5.2)
Alkaline Phosphatase: 58 U/L (ref 39–117)
BUN: 20 mg/dL (ref 6–23)
CO2: 29 mEq/L (ref 19–32)
Calcium: 9.7 mg/dL (ref 8.4–10.5)
Chloride: 102 mEq/L (ref 96–112)
Creatinine, Ser: 0.95 mg/dL (ref 0.40–1.50)
GFR: 87.98 mL/min (ref >60.00)
Glucose, Bld: 97 mg/dL (ref 70–99)
Potassium: 4.2 mEq/L (ref 3.5–5.1)
Sodium: 138 mEq/L (ref 135–145)
Total Bilirubin: 0.9 mg/dL (ref 0.2–1.2)
Total Protein: 7.4 g/dL (ref 6.0–8.3)

## 2020-06-16 LAB — LIPID PANEL
Cholesterol: 182 mg/dL (ref 0–200)
HDL: 37.5 mg/dL — ABNORMAL LOW (ref >39.00)
NonHDL: 144.04
Total CHOL/HDL Ratio: 5
Triglycerides: 211 mg/dL — ABNORMAL HIGH (ref 0.0–149.0)
VLDL: 42.2 mg/dL — ABNORMAL HIGH (ref 0.0–40.0)

## 2020-06-16 LAB — LDL CHOLESTEROL, DIRECT: Direct LDL: 108 mg/dL

## 2020-06-16 LAB — TSH: TSH: 4.19 u[IU]/mL (ref 0.35–4.50)

## 2020-06-16 LAB — PSA: PSA: 1.26 ng/mL (ref 0.10–4.00)

## 2020-06-16 LAB — HEMOGLOBIN A1C: Hgb A1c MFr Bld: 5.9 % (ref 4.6–6.5)

## 2020-06-16 MED ORDER — TADALAFIL 20 MG PO TABS: 20.0000 mg | ORAL_TABLET | Freq: Every day | ORAL | 5 refills | Status: DC | PRN

## 2020-06-16 MED ORDER — GLUCOSE BLOOD VI STRP: ORAL_STRIP | 5 refills | Status: AC

## 2020-06-16 MED ORDER — METFORMIN HCL 1000 MG PO TABS: ORAL_TABLET | ORAL | 3 refills | Status: DC

## 2020-06-16 MED ORDER — RYBELSUS 14 MG PO TABS: 1.0000 | ORAL_TABLET | Freq: Every day | ORAL | 3 refills | Status: DC

## 2020-06-16 MED ORDER — TRIAMTERENE-HCTZ 37.5-25 MG PO TABS: 1.0000 | ORAL_TABLET | Freq: Every day | ORAL | 3 refills | Status: DC

## 2020-06-16 MED ORDER — TELMISARTAN 80 MG PO TABS
80.0000 mg | ORAL_TABLET | Freq: Every day | ORAL | 3 refills | Status: DC
Start: 2020-06-16 — End: 2021-06-20
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
## 2020-12-05 ENCOUNTER — Ambulatory Visit: Payer: 59 | Admitting: Internal Medicine

## 2020-12-08 ENCOUNTER — Encounter: Payer: Self-pay | Admitting: Internal Medicine

## 2020-12-08 ENCOUNTER — Other Ambulatory Visit: Payer: Self-pay

## 2020-12-08 ENCOUNTER — Ambulatory Visit (INDEPENDENT_AMBULATORY_CARE_PROVIDER_SITE_OTHER): Payer: 59 | Admitting: Internal Medicine

## 2020-12-08 VITALS — BP 120/76 | HR 74 | Temp 98.2°F | Ht 75.0 in | Wt 331.0 lb

## 2020-12-08 DIAGNOSIS — E1169 Type 2 diabetes mellitus with other specified complication: Secondary | ICD-10-CM | POA: Diagnosis not present

## 2020-12-08 DIAGNOSIS — E119 Type 2 diabetes mellitus without complications: Secondary | ICD-10-CM | POA: Diagnosis not present

## 2020-12-08 DIAGNOSIS — Z23 Encounter for immunization: Secondary | ICD-10-CM

## 2020-12-08 DIAGNOSIS — E669 Obesity, unspecified: Secondary | ICD-10-CM

## 2020-12-08 DIAGNOSIS — I10 Essential (primary) hypertension: Secondary | ICD-10-CM | POA: Diagnosis not present

## 2020-12-08 DIAGNOSIS — I2583 Coronary atherosclerosis due to lipid rich plaque: Secondary | ICD-10-CM

## 2020-12-08 DIAGNOSIS — E559 Vitamin D deficiency, unspecified: Secondary | ICD-10-CM | POA: Diagnosis not present

## 2020-12-08 DIAGNOSIS — I2510 Atherosclerotic heart disease of native coronary artery without angina pectoris: Secondary | ICD-10-CM

## 2020-12-08 MED ORDER — PRAVASTATIN SODIUM 20 MG PO TABS: 20.0000 mg | ORAL_TABLET | Freq: Every day | ORAL | 3 refills | Status: DC

## 2020-12-08 NOTE — Assessment & Plan Note (Signed)
Cont on Micardis, Maxzide CMET

## 2020-12-08 NOTE — Assessment & Plan Note (Signed)
On Vit D 

## 2020-12-08 NOTE — Assessment & Plan Note (Signed)
Cont w/ Metformin, Rybelsus

## 2020-12-08 NOTE — Assessment & Plan Note (Signed)
Will try Pravachol 20 mg -- 2022
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
## 2020-12-08 NOTE — Assessment & Plan Note (Addendum)
Cont w/ Metformin, Rybelsus Check A1c

## 2021-01-08 ENCOUNTER — Other Ambulatory Visit: Payer: Self-pay | Admitting: Internal Medicine

## 2021-01-20 ENCOUNTER — Other Ambulatory Visit: Payer: Self-pay

## 2021-01-20 ENCOUNTER — Other Ambulatory Visit (INDEPENDENT_AMBULATORY_CARE_PROVIDER_SITE_OTHER): Payer: 59

## 2021-01-20 DIAGNOSIS — I2583 Coronary atherosclerosis due to lipid rich plaque: Secondary | ICD-10-CM | POA: Diagnosis not present

## 2021-01-20 DIAGNOSIS — I2510 Atherosclerotic heart disease of native coronary artery without angina pectoris: Secondary | ICD-10-CM | POA: Diagnosis not present

## 2021-01-20 DIAGNOSIS — E119 Type 2 diabetes mellitus without complications: Secondary | ICD-10-CM

## 2021-01-20 LAB — LIPID PANEL
Cholesterol: 122 mg/dL (ref 0–200)
HDL: 28.5 mg/dL — ABNORMAL LOW (ref >39.00)
NonHDL: 93.97
Total CHOL/HDL Ratio: 4
Triglycerides: 306 mg/dL — ABNORMAL HIGH (ref 0.0–149.0)
VLDL: 61.2 mg/dL — ABNORMAL HIGH (ref 0.0–40.0)

## 2021-01-20 LAB — COMPREHENSIVE METABOLIC PANEL
ALT: 21 U/L (ref 0–53)
AST: 19 U/L (ref 0–37)
Albumin: 4.1 g/dL (ref 3.5–5.2)
Alkaline Phosphatase: 54 U/L (ref 39–117)
BUN: 17 mg/dL (ref 6–23)
CO2: 26 mEq/L (ref 19–32)
Calcium: 9.1 mg/dL (ref 8.4–10.5)
Chloride: 100 mEq/L (ref 96–112)
Creatinine, Ser: 1.05 mg/dL (ref 0.40–1.50)
GFR: 77.69 mL/min (ref >60.00)
Glucose, Bld: 126 mg/dL — ABNORMAL HIGH (ref 70–99)
Potassium: 3.8 mEq/L (ref 3.5–5.1)
Sodium: 135 mEq/L (ref 135–145)
Total Bilirubin: 0.7 mg/dL (ref 0.2–1.2)
Total Protein: 6.9 g/dL (ref 6.0–8.3)

## 2021-01-20 LAB — LDL CHOLESTEROL, DIRECT: Direct LDL: 63 mg/dL

## 2021-01-20 LAB — HEMOGLOBIN A1C: Hgb A1c MFr Bld: 6.4 % (ref 4.6–6.5)

## 2021-05-05 LAB — HM DIABETES EYE EXAM

## 2021-05-24 ENCOUNTER — Encounter: Payer: Self-pay | Admitting: Internal Medicine

## 2021-06-20 ENCOUNTER — Other Ambulatory Visit: Payer: Self-pay

## 2021-06-20 ENCOUNTER — Encounter: Payer: Self-pay | Admitting: Internal Medicine

## 2021-06-20 ENCOUNTER — Ambulatory Visit (INDEPENDENT_AMBULATORY_CARE_PROVIDER_SITE_OTHER): Payer: 59 | Admitting: Internal Medicine

## 2021-06-20 ENCOUNTER — Other Ambulatory Visit: Payer: Self-pay | Admitting: Internal Medicine

## 2021-06-20 DIAGNOSIS — E119 Type 2 diabetes mellitus without complications: Secondary | ICD-10-CM | POA: Diagnosis not present

## 2021-06-20 DIAGNOSIS — Z0000 Encounter for general adult medical examination without abnormal findings: Secondary | ICD-10-CM | POA: Diagnosis not present

## 2021-06-20 DIAGNOSIS — I10 Essential (primary) hypertension: Secondary | ICD-10-CM | POA: Diagnosis not present

## 2021-06-20 DIAGNOSIS — I2510 Atherosclerotic heart disease of native coronary artery without angina pectoris: Secondary | ICD-10-CM | POA: Diagnosis not present

## 2021-06-20 DIAGNOSIS — Z125 Encounter for screening for malignant neoplasm of prostate: Secondary | ICD-10-CM

## 2021-06-20 DIAGNOSIS — I2583 Coronary atherosclerosis due to lipid rich plaque: Secondary | ICD-10-CM

## 2021-06-20 LAB — CBC WITH DIFFERENTIAL/PLATELET
Basophils Absolute: 0 10*3/uL (ref 0.0–0.1)
Basophils Relative: 0.6 % (ref 0.0–3.0)
Eosinophils Absolute: 0.1 10*3/uL (ref 0.0–0.7)
Eosinophils Relative: 1.1 % (ref 0.0–5.0)
HCT: 44.7 % (ref 39.0–52.0)
Hemoglobin: 15.6 g/dL (ref 13.0–17.0)
Lymphocytes Relative: 39.4 % (ref 12.0–46.0)
Lymphs Abs: 3.2 10*3/uL (ref 0.7–4.0)
MCHC: 34.8 g/dL (ref 30.0–36.0)
MCV: 93.6 fl (ref 78.0–100.0)
Monocytes Absolute: 0.5 10*3/uL (ref 0.1–1.0)
Monocytes Relative: 6.5 % (ref 3.0–12.0)
Neutro Abs: 4.3 10*3/uL (ref 1.4–7.7)
Neutrophils Relative %: 52.4 % (ref 43.0–77.0)
Platelets: 186 10*3/uL (ref 150.0–400.0)
RBC: 4.78 Mil/uL (ref 4.22–5.81)
RDW: 14.1 % (ref 11.5–15.5)
WBC: 8.1 10*3/uL (ref 4.0–10.5)

## 2021-06-20 LAB — LIPID PANEL
Cholesterol: 136 mg/dL (ref 0–200)
HDL: 32.6 mg/dL — ABNORMAL LOW (ref >39.00)
NonHDL: 103.16
Total CHOL/HDL Ratio: 4
Triglycerides: 257 mg/dL — ABNORMAL HIGH (ref 0.0–149.0)
VLDL: 51.4 mg/dL — ABNORMAL HIGH (ref 0.0–40.0)

## 2021-06-20 LAB — COMPREHENSIVE METABOLIC PANEL
ALT: 23 U/L (ref 0–53)
AST: 17 U/L (ref 0–37)
Albumin: 4.5 g/dL (ref 3.5–5.2)
Alkaline Phosphatase: 56 U/L (ref 39–117)
BUN: 16 mg/dL (ref 6–23)
CO2: 27 mEq/L (ref 19–32)
Calcium: 9.6 mg/dL (ref 8.4–10.5)
Chloride: 98 mEq/L (ref 96–112)
Creatinine, Ser: 0.96 mg/dL (ref 0.40–1.50)
GFR: 86.26 mL/min (ref >60.00)
Glucose, Bld: 128 mg/dL — ABNORMAL HIGH (ref 70–99)
Potassium: 3.9 mEq/L (ref 3.5–5.1)
Sodium: 135 mEq/L (ref 135–145)
Total Bilirubin: 1 mg/dL (ref 0.2–1.2)
Total Protein: 7.1 g/dL (ref 6.0–8.3)

## 2021-06-20 LAB — URINALYSIS
Bilirubin Urine: NEGATIVE
Hgb urine dipstick: NEGATIVE
Ketones, ur: NEGATIVE
Leukocytes,Ua: NEGATIVE
Nitrite: NEGATIVE
Specific Gravity, Urine: 1.025 (ref 1.000–1.030)
Total Protein, Urine: NEGATIVE
Urine Glucose: NEGATIVE
Urobilinogen, UA: 0.2 (ref 0.0–1.0)
pH: 5.5 (ref 5.0–8.0)

## 2021-06-20 LAB — LDL CHOLESTEROL, DIRECT: Direct LDL: 70 mg/dL

## 2021-06-20 LAB — TSH: TSH: 4.25 u[IU]/mL (ref 0.35–5.50)

## 2021-06-20 LAB — PSA: PSA: 1.17 ng/mL (ref 0.10–4.00)

## 2021-06-20 LAB — MICROALBUMIN / CREATININE URINE RATIO
Creatinine,U: 139.5 mg/dL
Microalb Creat Ratio: 0.5 mg/g (ref 0.0–30.0)
Microalb, Ur: <0.7 mg/dL (ref 0.0–1.9)

## 2021-06-20 LAB — HEMOGLOBIN A1C: Hgb A1c MFr Bld: 6.6 % — ABNORMAL HIGH (ref 4.6–6.5)

## 2021-06-20 MED ORDER — TIRZEPATIDE 2.5 MG/0.5ML ~~LOC~~ SOAJ: 2.5000 mg | SUBCUTANEOUS | 3 refills | Status: DC

## 2021-06-20 MED ORDER — PRAVASTATIN SODIUM 20 MG PO TABS: 20.0000 mg | ORAL_TABLET | Freq: Every day | ORAL | 3 refills | Status: DC

## 2021-06-20 MED ORDER — TRIAMTERENE-HCTZ 37.5-25 MG PO TABS: 1.0000 | ORAL_TABLET | Freq: Every day | ORAL | 3 refills | Status: DC

## 2021-06-20 MED ORDER — TADALAFIL 20 MG PO TABS: ORAL_TABLET | ORAL | 11 refills | Status: DC

## 2021-06-20 MED ORDER — METFORMIN HCL 1000 MG PO TABS: ORAL_TABLET | ORAL | 3 refills | Status: DC

## 2021-06-20 MED ORDER — TELMISARTAN 80 MG PO TABS: 80.0000 mg | ORAL_TABLET | Freq: Every day | ORAL | 3 refills | Status: DC

## 2021-06-20 NOTE — Assessment & Plan Note (Signed)
Cont on Micardis, Maxzide BP Readings from Last 3 Encounters:  06/20/21 122/70  12/08/20 120/76  06/16/20 124/80

## 2021-06-20 NOTE — Assessment & Plan Note (Signed)
Start Mounjaro Check A1c

## 2021-06-20 NOTE — Patient Instructions (Addendum)
Mounjaro or Ozempic 

## 2021-06-20 NOTE — Assessment & Plan Note (Signed)
Cont w/Pravachol
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
## 2021-06-20 NOTE — Assessment & Plan Note (Addendum)
°  We discussed age appropriate health related issues, including available/recomended screening tests and vaccinations. Labs were ordered to be later reviewed . All questions were answered. We discussed one or more of the following - seat belt use, use of sunscreen/sun exposure exercise, fall risk reduction, second hand smoke exposure, firearm use and storage, seat belt use, a need for adhering to healthy diet and exercise. Labs were ordered.  All questions were answered. Colon 2016; due in 2026

## 2021-07-13 ENCOUNTER — Other Ambulatory Visit: Payer: Self-pay | Admitting: Internal Medicine

## 2021-07-18 ENCOUNTER — Telehealth: Payer: Self-pay | Admitting: Internal Medicine

## 2021-07-18 NOTE — Telephone Encounter (Signed)
Pt inquiring if he should discontinue RYBELSUS due to being prescribed METFORMIN  Pt requesting a c/b

## 2021-07-19 NOTE — Telephone Encounter (Signed)
No.  Please take both.  Thanks

## 2021-07-20 NOTE — Telephone Encounter (Signed)
Notified pt w/ MD response../l b 

## 2021-08-25 NOTE — Progress Notes (Signed)
error 

## 2021-09-20 ENCOUNTER — Ambulatory Visit (INDEPENDENT_AMBULATORY_CARE_PROVIDER_SITE_OTHER): Payer: 59 | Admitting: Internal Medicine

## 2021-09-20 ENCOUNTER — Encounter: Payer: Self-pay | Admitting: Internal Medicine

## 2021-09-20 VITALS — BP 122/72 | HR 79 | Temp 98.3°F | Ht 75.0 in | Wt 323.0 lb

## 2021-09-20 DIAGNOSIS — E119 Type 2 diabetes mellitus without complications: Secondary | ICD-10-CM

## 2021-09-20 DIAGNOSIS — E669 Obesity, unspecified: Secondary | ICD-10-CM

## 2021-09-20 DIAGNOSIS — R739 Hyperglycemia, unspecified: Secondary | ICD-10-CM

## 2021-09-20 DIAGNOSIS — I10 Essential (primary) hypertension: Secondary | ICD-10-CM | POA: Diagnosis not present

## 2021-09-20 DIAGNOSIS — E1169 Type 2 diabetes mellitus with other specified complication: Secondary | ICD-10-CM

## 2021-09-20 LAB — COMPREHENSIVE METABOLIC PANEL
ALT: 26 U/L (ref 0–53)
AST: 23 U/L (ref 0–37)
Albumin: 4.5 g/dL (ref 3.5–5.2)
Alkaline Phosphatase: 66 U/L (ref 39–117)
BUN: 17 mg/dL (ref 6–23)
CO2: 25 mEq/L (ref 19–32)
Calcium: 10.1 mg/dL (ref 8.4–10.5)
Chloride: 100 mEq/L (ref 96–112)
Creatinine, Ser: 0.94 mg/dL (ref 0.40–1.50)
GFR: 88.31 mL/min (ref >60.00)
Glucose, Bld: 109 mg/dL — ABNORMAL HIGH (ref 70–99)
Potassium: 4 mEq/L (ref 3.5–5.1)
Sodium: 134 mEq/L — ABNORMAL LOW (ref 135–145)
Total Bilirubin: 0.9 mg/dL (ref 0.2–1.2)
Total Protein: 7.4 g/dL (ref 6.0–8.3)

## 2021-09-20 LAB — HEMOGLOBIN A1C: Hgb A1c MFr Bld: 6.3 % (ref 4.6–6.5)

## 2021-09-20 MED ORDER — TIRZEPATIDE 5 MG/0.5ML ~~LOC~~ SOAJ: 5.0000 mg | SUBCUTANEOUS | 3 refills | Status: DC

## 2021-09-20 NOTE — Assessment & Plan Note (Addendum)
Cont on Mounjaro.  Will increase the dose ?
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
## 2021-10-15 NOTE — Assessment & Plan Note (Signed)
Cont on Mounjaro.  Will increase the dose.  Low-carb diet ?

## 2021-10-15 NOTE — Assessment & Plan Note (Signed)
Continue on Micardis and Maxide ?Continue with weight loss program ?

## 2021-11-07 ENCOUNTER — Telehealth: Payer: Self-pay

## 2021-11-07 MED ORDER — TIRZEPATIDE 7.5 MG/0.5ML ~~LOC~~ SOAJ: 7.5000 mg | SUBCUTANEOUS | 3 refills | Status: DC

## 2021-11-07 NOTE — Telephone Encounter (Signed)
Pt is requesting increase from 5 to 7.5 mg of tirzepatide (MOUNJARO) 5 MG/0.5ML Pen.  [place] [id number]

## 2021-11-07 NOTE — Telephone Encounter (Signed)
OK. Thx

## 2021-11-07 NOTE — Telephone Encounter (Signed)
Pt is requesting increase from 5 to 7.5 mg of tirzepatide (MOUNJARO) 5 MG/0.5ML Pen.   [place] [id number]

## 2021-12-27 ENCOUNTER — Encounter: Payer: Self-pay | Admitting: Internal Medicine

## 2021-12-27 ENCOUNTER — Ambulatory Visit: Payer: 59 | Admitting: Internal Medicine

## 2021-12-27 DIAGNOSIS — I10 Essential (primary) hypertension: Secondary | ICD-10-CM | POA: Diagnosis not present

## 2021-12-27 DIAGNOSIS — E1169 Type 2 diabetes mellitus with other specified complication: Secondary | ICD-10-CM

## 2021-12-27 DIAGNOSIS — E669 Obesity, unspecified: Secondary | ICD-10-CM

## 2021-12-27 MED ORDER — TIRZEPATIDE 10 MG/0.5ML ~~LOC~~ SOAJ: 10.0000 mg | SUBCUTANEOUS | 3 refills | Status: DC

## 2021-12-27 NOTE — Assessment & Plan Note (Signed)
Will increase Mounjaro dose
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
## 2021-12-27 NOTE — Assessment & Plan Note (Signed)
Wt Readings from Last 3 Encounters:  12/27/21 (!) 326 lb (147.9 kg)  09/20/21 (!) 323 lb (146.5 kg)  06/20/21 (!) 327 lb (148.3 kg)  Will increase Mounjaro dose

## 2021-12-27 NOTE — Assessment & Plan Note (Signed)
Cont w/wt loss 

## 2022-01-24 ENCOUNTER — Encounter (INDEPENDENT_AMBULATORY_CARE_PROVIDER_SITE_OTHER): Payer: Self-pay

## 2022-03-29 ENCOUNTER — Ambulatory Visit: Payer: 59 | Admitting: Internal Medicine

## 2022-03-29 ENCOUNTER — Encounter: Payer: Self-pay | Admitting: Internal Medicine

## 2022-03-29 DIAGNOSIS — Z23 Encounter for immunization: Secondary | ICD-10-CM

## 2022-03-29 DIAGNOSIS — I10 Essential (primary) hypertension: Secondary | ICD-10-CM

## 2022-03-29 DIAGNOSIS — E669 Obesity, unspecified: Secondary | ICD-10-CM

## 2022-03-29 DIAGNOSIS — D485 Neoplasm of uncertain behavior of skin: Secondary | ICD-10-CM | POA: Diagnosis not present

## 2022-03-29 DIAGNOSIS — E1169 Type 2 diabetes mellitus with other specified complication: Secondary | ICD-10-CM

## 2022-03-29 LAB — COMPREHENSIVE METABOLIC PANEL
ALT: 22 U/L (ref 0–53)
AST: 20 U/L (ref 0–37)
Albumin: 4.4 g/dL (ref 3.5–5.2)
Alkaline Phosphatase: 67 U/L (ref 39–117)
BUN: 13 mg/dL (ref 6–23)
CO2: 26 mEq/L (ref 19–32)
Calcium: 9.9 mg/dL (ref 8.4–10.5)
Chloride: 100 mEq/L (ref 96–112)
Creatinine, Ser: 1.06 mg/dL (ref 0.40–1.50)
GFR: 76.18 mL/min (ref >60.00)
Glucose, Bld: 94 mg/dL (ref 70–99)
Potassium: 3.9 mEq/L (ref 3.5–5.1)
Sodium: 135 mEq/L (ref 135–145)
Total Bilirubin: 1.1 mg/dL (ref 0.2–1.2)
Total Protein: 7.5 g/dL (ref 6.0–8.3)

## 2022-03-29 LAB — HEMOGLOBIN A1C: Hgb A1c MFr Bld: 5.7 % (ref 4.6–6.5)

## 2022-03-29 MED ORDER — TIRZEPATIDE 12.5 MG/0.5ML ~~LOC~~ SOAJ: 12.5000 mg | SUBCUTANEOUS | 3 refills | Status: DC

## 2022-03-29 NOTE — Patient Instructions (Signed)
Reduce Maxzide to 1/2 tab if low BP 

## 2022-03-29 NOTE — Assessment & Plan Note (Signed)
Reduce Maxzide to 1/2 tab if low BP 

## 2022-03-29 NOTE — Assessment & Plan Note (Signed)
Increase Mounjaro to 12.5 mg
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
## 2022-03-29 NOTE — Assessment & Plan Note (Addendum)
Ref to [place] lesion on the right leg suspicious for melanoma - see picture (pt noticed 1 month ago) - needs appt ASAP.

## 2022-06-22 ENCOUNTER — Telehealth: Payer: Self-pay | Admitting: Internal Medicine

## 2022-06-22 NOTE — Telephone Encounter (Signed)
Patient is in need of his monjaro - the pharmacy is out of the 12.'5mg'$  and on backorder - they have the '15mg'$  and plenty of it - patient wants to know if he can be switched to the 15 mg?  Please advise  Patients number:  [phone]  Pharmacy:  [facility] on [address]

## 2022-06-24 MED ORDER — TIRZEPATIDE 15 MG/0.5ML ~~LOC~~ SOAJ: 15.0000 mg | SUBCUTANEOUS | 3 refills | Status: DC

## 2022-06-24 NOTE — Telephone Encounter (Signed)
Yes.  I will send the prescription in.  Thanks

## 2022-06-25 NOTE — Telephone Encounter (Signed)
Called pt no answer LMOM MD sent Mounjaro '15mg'$  to [facility]..[name]

## 2022-06-27 ENCOUNTER — Encounter: Payer: Self-pay | Admitting: Internal Medicine

## 2022-06-27 ENCOUNTER — Ambulatory Visit (INDEPENDENT_AMBULATORY_CARE_PROVIDER_SITE_OTHER): Payer: 59

## 2022-06-27 ENCOUNTER — Ambulatory Visit: Payer: 59 | Admitting: Internal Medicine

## 2022-06-27 VITALS — BP 124/84 | HR 75 | Temp 98.6°F | Ht 75.0 in | Wt 311.0 lb

## 2022-06-27 DIAGNOSIS — J101 Influenza due to other identified influenza virus with other respiratory manifestations: Secondary | ICD-10-CM | POA: Diagnosis not present

## 2022-06-27 DIAGNOSIS — J069 Acute upper respiratory infection, unspecified: Secondary | ICD-10-CM | POA: Diagnosis not present

## 2022-06-27 DIAGNOSIS — R058 Other specified cough: Secondary | ICD-10-CM | POA: Diagnosis not present

## 2022-06-27 LAB — POC INFLUENZA A&B (BINAX/QUICKVUE)
Influenza A, POC: POSITIVE — AB
Influenza B, POC: NEGATIVE

## 2022-06-27 LAB — POCT RESPIRATORY SYNCYTIAL VIRUS: RSV Rapid Ag: NEGATIVE

## 2022-06-27 LAB — POC COVID19 BINAXNOW: SARS Coronavirus 2 Ag: NEGATIVE

## 2022-06-27 MED ORDER — OSELTAMIVIR PHOSPHATE 75 MG PO CAPS: 75.0000 mg | ORAL_CAPSULE | Freq: Two times a day (BID) | ORAL | 0 refills | Status: AC

## 2022-06-27 MED ORDER — HYDROCOD POLI-CHLORPHE POLI ER 10-8 MG/5ML PO SUER: 5.0000 mL | Freq: Two times a day (BID) | ORAL | 0 refills | Status: DC | PRN

## 2022-06-27 NOTE — Addendum Note (Signed)
Addended by: Marcina Millard on: 06/27/2022 02:59 PM   Modules accepted: Orders

## 2022-06-27 NOTE — Progress Notes (Signed)
Subjective:    Patient ID: Joel Medina, male    DOB: [birth year], 62 y.o.   MRN: [id number]      HPI Joel Medina is here for  Chief Complaint  Patient presents with   Cough    Cough, nasal congestion, body aches and chills    Symptoms started two days ago.  Started with a headache.  Got some nasal congestion, a couple of teeth hurt.  Yesterday - chills, subjective fever,   worse in evening.   Last night had sweats.    Using mucinex nasal spray, tylenol  Medications and allergies reviewed with patient and updated if appropriate.  Current Outpatient Medications on File Prior to Visit  Medication Sig Dispense Refill   glucose blood test strip Use as instructed 100 each 5   Insulin Pen Needle (BD PEN NEEDLE NANO U/F) 32G X 4 MM MISC USE TO INJECT EVERY MORNING 100 each 3   Lancets (ACCU-CHEK SAFE-T PRO) lancets Use as instructed 100 each 5   metFORMIN (GLUCOPHAGE) 1000 MG tablet TAKE 1 TABLET BY MOUTH TWICE A DAY 180 tablet 3   pravastatin (PRAVACHOL) 20 MG tablet Take 1 tablet (20 mg total) by mouth daily. 90 tablet 3   RYBELSUS 14 MG TABS TAKE 1 TABLET BY MOUTH DAILY. AS DIRECTED BEFORE MEALS 90 tablet 3   tadalafil (CIALIS) 20 MG tablet TAKE 1 TABLET BY MOUTH EVERY DAY AS NEEDED FOR ERECTILE DYSFUNCTION 5 tablet 11   telmisartan (MICARDIS) 80 MG tablet Take 1 tablet (80 mg total) by mouth daily. 90 tablet 3   tirzepatide (MOUNJARO) 15 MG/0.5ML Pen Inject 15 mg into the skin once a week. 6 mL 3   triamterene-hydrochlorothiazide (MAXZIDE-25) 37.5-25 MG tablet Take 1 tablet by mouth daily. 90 tablet 3   No current facility-administered medications on file prior to visit.    Review of Systems  Constitutional:  Positive for chills, diaphoresis and fever (subjective).  HENT:  Positive for congestion. Negative for ear pain, sinus pressure, sinus pain and sore throat.        Couple of teeth hurt  Respiratory:  Positive for cough (bringing up some phlegm). Negative for shortness  of breath and wheezing.   Gastrointestinal:  Positive for diarrhea (2 nights ago - none since). Negative for nausea.  Musculoskeletal:  Positive for myalgias.  Neurological:  Positive for headaches. Negative for dizziness and light-headedness.       Objective:   Vitals:   06/27/22 1043  BP: 124/84  Pulse: 75  Temp: 98.6 F (37 C)  SpO2: 98%   BP Readings from Last 3 Encounters:  06/27/22 124/84  03/29/22 110/72  12/27/21 118/76   Wt Readings from Last 3 Encounters:  06/27/22 (!) 311 lb (141.1 kg)  03/29/22 (!) 306 lb 9.6 oz (139.1 kg)  12/27/21 (!) 326 lb (147.9 kg)   Body mass index is 38.87 kg/m.    Physical Exam Constitutional:      General: He is not in acute distress.    Appearance: Normal appearance. He is not ill-appearing.  HENT:     Head: Normocephalic.     Right Ear: Tympanic membrane, ear canal and external ear normal. There is no impacted cerumen.     Left Ear: Tympanic membrane, ear canal and external ear normal. There is no impacted cerumen.     Mouth/Throat:     Mouth: Mucous membranes are moist.     Pharynx: No oropharyngeal exudate or posterior oropharyngeal erythema.  Eyes:  Conjunctiva/sclera: Conjunctivae normal.  Cardiovascular:     Rate and Rhythm: Normal rate and regular rhythm.  Pulmonary:     Effort: Pulmonary effort is normal. No respiratory distress.     Breath sounds: Normal breath sounds. No wheezing or rales.  Musculoskeletal:     Cervical back: Neck supple. No tenderness.  Lymphadenopathy:     Cervical: No cervical adenopathy.  Skin:    General: Skin is warm and dry.     Findings: No rash.  Neurological:     Mental Status: He is alert.        DG Chest 2 View CLINICAL DATA:  Provided history: Productive cough and congestion for 3 days. History of hypertension and diabetes.  EXAM: CHEST - 2 VIEW  COMPARISON:  Chest CT 09/14/2018. Prior chest radiographs 09/14/2018 and earlier.  FINDINGS: Heart size within normal  limits. Chronic elevation of the right hemidiaphragm. No appreciable airspace consolidation. No evidence of pleural effusion or pneumothorax. Multilevel bridging ventrolateral osteophytes within the thoracic spine with relative preservation of the intervertebral disc spaces. These findings can be seen in the setting of diffuse idiopathic skeletal hyperostosis (DISH). Degenerative changes of the imaged glenohumeral joints.  IMPRESSION: No evidence of acute cardiopulmonary abnormality.  Chronic elevation of the right hemidiaphragm.  Findings suggestive of diffuse idiopathic skeletal hyperostosis (DISH).  Electronically Signed   By: Kellie Simmering D.O.   On: 06/27/2022 11:29      Assessment & Plan:    Influenza A, productive cough: Acute Symptoms started 2 days ago COVID test and RSV test negative Influenza A test positive Check chest x-ray for productive cough -negative for pneumonia Start Tamiflu 75 mg twice daily x 5 days Tussionex cough syrup twice daily as needed Symptomatic treatment Rest, fluids Call if no improvement

## 2022-06-27 NOTE — Patient Instructions (Addendum)
      Have a chest xray downstairs.      Medications changes include :   tussionex cough syrup      Return if symptoms worsen or fail to improve.

## 2022-07-02 ENCOUNTER — Encounter: Payer: Self-pay | Admitting: Internal Medicine

## 2022-07-02 ENCOUNTER — Ambulatory Visit: Payer: 59 | Admitting: Internal Medicine

## 2022-07-02 VITALS — BP 118/70 | HR 81 | Temp 98.1°F | Ht 75.0 in | Wt 307.0 lb

## 2022-07-02 DIAGNOSIS — I10 Essential (primary) hypertension: Secondary | ICD-10-CM | POA: Diagnosis not present

## 2022-07-02 DIAGNOSIS — D485 Neoplasm of uncertain behavior of skin: Secondary | ICD-10-CM

## 2022-07-02 DIAGNOSIS — E119 Type 2 diabetes mellitus without complications: Secondary | ICD-10-CM | POA: Diagnosis not present

## 2022-07-02 DIAGNOSIS — J069 Acute upper respiratory infection, unspecified: Secondary | ICD-10-CM | POA: Diagnosis not present

## 2022-07-02 DIAGNOSIS — M545 Low back pain, unspecified: Secondary | ICD-10-CM

## 2022-07-02 LAB — COMPREHENSIVE METABOLIC PANEL
ALT: 22 U/L (ref 0–53)
AST: 21 U/L (ref 0–37)
Albumin: 4.2 g/dL (ref 3.5–5.2)
Alkaline Phosphatase: 59 U/L (ref 39–117)
BUN: 17 mg/dL (ref 6–23)
CO2: 24 mEq/L (ref 19–32)
Calcium: 9.4 mg/dL (ref 8.4–10.5)
Chloride: 99 mEq/L (ref 96–112)
Creatinine, Ser: 0.93 mg/dL (ref 0.40–1.50)
GFR: 88.97 mL/min (ref >60.00)
Glucose, Bld: 111 mg/dL — ABNORMAL HIGH (ref 70–99)
Potassium: 3.8 mEq/L (ref 3.5–5.1)
Sodium: 134 mEq/L — ABNORMAL LOW (ref 135–145)
Total Bilirubin: 1.2 mg/dL (ref 0.2–1.2)
Total Protein: 7.2 g/dL (ref 6.0–8.3)

## 2022-07-02 LAB — HEMOGLOBIN A1C: Hgb A1c MFr Bld: 5.7 % (ref 4.6–6.5)

## 2022-07-02 MED ORDER — TIRZEPATIDE 15 MG/0.5ML ~~LOC~~ SOAJ: 15.0000 mg | SUBCUTANEOUS | 3 refills | Status: DC

## 2022-07-02 NOTE — Assessment & Plan Note (Signed)
Reduce Maxzide to 1/2 tab if low BP

## 2022-07-02 NOTE — Assessment & Plan Note (Signed)
Cont on Mounjaro 15 mg/wk

## 2022-07-02 NOTE — Assessment & Plan Note (Signed)
Resolving Pt took Tamiflu
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
## 2022-07-02 NOTE — Assessment & Plan Note (Signed)
2024 CXR: Findings suggestive of diffuse idiopathic skeletal hyperostosis (DISH).

## 2022-07-02 NOTE — Assessment & Plan Note (Signed)
Melanoma per pt - resected

## 2022-07-03 ENCOUNTER — Other Ambulatory Visit: Payer: Self-pay | Admitting: Internal Medicine

## 2022-07-08 ENCOUNTER — Other Ambulatory Visit: Payer: Self-pay | Admitting: Internal Medicine

## 2022-07-08 DIAGNOSIS — I10 Essential (primary) hypertension: Secondary | ICD-10-CM

## 2022-07-16 ENCOUNTER — Other Ambulatory Visit: Payer: Self-pay | Admitting: Internal Medicine

## 2022-07-17 ENCOUNTER — Other Ambulatory Visit: Payer: Self-pay | Admitting: Internal Medicine

## 2022-07-22 ENCOUNTER — Other Ambulatory Visit: Payer: Self-pay | Admitting: Internal Medicine

## 2022-10-02 ENCOUNTER — Ambulatory Visit: Payer: 59 | Admitting: Internal Medicine

## 2022-10-02 ENCOUNTER — Encounter: Payer: Self-pay | Admitting: Internal Medicine

## 2022-10-02 ENCOUNTER — Other Ambulatory Visit (HOSPITAL_BASED_OUTPATIENT_CLINIC_OR_DEPARTMENT_OTHER): Payer: Self-pay

## 2022-10-02 VITALS — BP 118/78 | HR 75 | Temp 98.2°F | Ht 75.0 in | Wt 308.0 lb

## 2022-10-02 DIAGNOSIS — M545 Low back pain, unspecified: Secondary | ICD-10-CM | POA: Diagnosis not present

## 2022-10-02 DIAGNOSIS — E669 Obesity, unspecified: Secondary | ICD-10-CM

## 2022-10-02 DIAGNOSIS — I2583 Coronary atherosclerosis due to lipid rich plaque: Secondary | ICD-10-CM

## 2022-10-02 DIAGNOSIS — E1169 Type 2 diabetes mellitus with other specified complication: Secondary | ICD-10-CM | POA: Diagnosis not present

## 2022-10-02 DIAGNOSIS — I2510 Atherosclerotic heart disease of native coronary artery without angina pectoris: Secondary | ICD-10-CM

## 2022-10-02 MED ORDER — TIRZEPATIDE 12.5 MG/0.5ML ~~LOC~~ SOAJ: 12.5000 mg | SUBCUTANEOUS | 2 refills | Status: DC

## 2022-10-02 MED ORDER — TIRZEPATIDE 15 MG/0.5ML ~~LOC~~ SOAJ: 15.0000 mg | SUBCUTANEOUS | 3 refills | Status: DC

## 2022-10-02 MED ORDER — MOUNJARO 15 MG/0.5ML ~~LOC~~ SOAJ
15.0000 mg | SUBCUTANEOUS | 1 refills | Status: DC
  Filled 2022-10-02: qty 2, 28d supply, fill #0
  Filled 2022-10-25 – 2022-12-11 (×2): qty 2, 28d supply, fill #1

## 2022-10-02 MED ORDER — TIRZEPATIDE 10 MG/0.5ML ~~LOC~~ SOAJ: 10.0000 mg | SUBCUTANEOUS | 2 refills | Status: DC

## 2022-10-02 NOTE — Assessment & Plan Note (Signed)
Doing well 

## 2022-10-02 NOTE — Assessment & Plan Note (Signed)
Will try Pravachol 20 mg -
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
## 2022-10-02 NOTE — Assessment & Plan Note (Signed)
Mounjaro 15 mg is n/a - out for 3 weeks

## 2022-10-15 ENCOUNTER — Other Ambulatory Visit: Payer: Self-pay | Admitting: Internal Medicine

## 2022-10-15 DIAGNOSIS — E119 Type 2 diabetes mellitus without complications: Secondary | ICD-10-CM

## 2022-10-16 ENCOUNTER — Other Ambulatory Visit: Payer: Self-pay

## 2022-10-16 ENCOUNTER — Telehealth: Payer: Self-pay | Admitting: Internal Medicine

## 2022-10-16 DIAGNOSIS — I10 Essential (primary) hypertension: Secondary | ICD-10-CM

## 2022-10-16 MED ORDER — TADALAFIL 20 MG PO TABS: ORAL_TABLET | ORAL | 11 refills | Status: DC

## 2022-10-16 MED ORDER — RYBELSUS 14 MG PO TABS: ORAL_TABLET | ORAL | 5 refills | Status: DC

## 2022-10-16 MED ORDER — TELMISARTAN 80 MG PO TABS
80.0000 mg | ORAL_TABLET | Freq: Every day | ORAL | 3 refills | Status: DC
Start: 2022-10-16 — End: 2023-01-01

## 2022-10-16 MED ORDER — PRAVASTATIN SODIUM 20 MG PO TABS: 20.0000 mg | ORAL_TABLET | Freq: Every day | ORAL | 3 refills | Status: DC

## 2022-10-16 NOTE — Telephone Encounter (Signed)
Prescription Request  10/16/2022  LOV: 10/02/2022  What is the name of the medication or equipment?  tadalafil (CIALIS) 20 MG tablet  telmisartan (MICARDIS) 80 MG tablet  Semaglutide (RYBELSUS) 14 MG TABS pravastatin (PRAVACHOL) 20 MG tablet  Have you contacted your pharmacy to request a refill? Yes   Which pharmacy would you like this sent to?  [id number] - [place], [place] - [address] [place] [address] Phone: [phone] Fax: [phone]    Patient notified that their request is being sent to the clinical staff for review and that they should receive a response within 2 business days.   Please advise at Mobile [phone] (mobile)

## 2022-10-18 NOTE — Telephone Encounter (Signed)
Pt states that his insurance will not cover the [facility] RX because is also taking MOUNJARO and these 2 medications are in the same drug family.   Pt is unsure if he should be taken off the [facility] or if we need to start a PA for him.   Please review and inform pt of decision: [phone]

## 2022-10-18 NOTE — Telephone Encounter (Signed)
Pls advise if pt needs both med.Marland KitchenRaechel Chute

## 2022-10-19 NOTE — Telephone Encounter (Signed)
He does not need Rybelsus.  He is on Mounjaro.  Thanks

## 2022-10-19 NOTE — Telephone Encounter (Signed)
Notified pt w/MD response.../lmb 

## 2022-10-26 ENCOUNTER — Other Ambulatory Visit (HOSPITAL_BASED_OUTPATIENT_CLINIC_OR_DEPARTMENT_OTHER): Payer: Self-pay

## 2022-11-07 ENCOUNTER — Other Ambulatory Visit (HOSPITAL_BASED_OUTPATIENT_CLINIC_OR_DEPARTMENT_OTHER): Payer: Self-pay

## 2022-12-10 ENCOUNTER — Encounter: Payer: Self-pay | Admitting: Internal Medicine

## 2022-12-10 NOTE — Progress Notes (Unsigned)
Virtual Visit via Video Note  I connected with Joel Medina on 12/10/22 at  2:40 PM EDT by a video enabled telemedicine application and verified that I am speaking with the correct person using two identifiers.   I discussed the limitations of evaluation and management by telemedicine and the availability of in person appointments. The patient expressed understanding and agreed to proceed.  Present for the visit:  Myself, Dr Cheryll Cockayne, [place].  The patient is currently at home and I am in the office.    No referring provider.    History of Present Illness: This is an acute visit for covid   Hold statin Mya dec  mounjaro effectiveness - monitor sugar No cialis  GFR 88.97 (06/2022)   ROS    Social History   Socioeconomic History   Marital status: Married    Spouse name: Not on file   Number of children: 4   Years of education: Not on file   Highest education level: Bachelor's degree (e.g., BA, AB, BS)  Occupational History   Occupation: Advice worker [employer]  Tobacco Use   Smoking status: Never   Smokeless tobacco: Never  Substance and Sexual Activity   Alcohol use: Yes    Alcohol/week: 12.0 standard drinks of alcohol    Types: 12 Cans of beer per week   Drug use: No   Sexual activity: Yes  Other Topics Concern   Not on file  Social History Narrative   Regular exercise - Yes, gym            Social Determinants of Health   Financial Resource Strain: Low Risk  (10/01/2022)   Overall Financial Resource Strain (CARDIA)    Difficulty of Paying Living Expenses: Not very hard  Food Insecurity: No Food Insecurity (10/01/2022)   Hunger Vital Sign    Worried About Running Out of Food in the Last Year: Never true    Ran Out of Food in the Last Year: Never true  Transportation Needs: No Transportation Needs (10/01/2022)   PRAPARE - Administrator, Civil Service (Medical): No    Lack of Transportation (Non-Medical): No  Physical Activity:  Insufficiently Active (10/01/2022)   Exercise Vital Sign    Days of Exercise per Week: 7 days    Minutes of Exercise per Session: 20 min  Stress: No Stress Concern Present (10/01/2022)   [employer] of Occupational Health - Occupational Stress Questionnaire    Feeling of Stress : Only a little  Social Connections: Unknown (10/01/2022)   Social Connection and Isolation Panel [NHANES]    Frequency of Communication with Friends and Family: More than three times a week    Frequency of Social Gatherings with Friends and Family: Once a week    Attends Religious Services: Patient declined    Database administrator or Organizations: Patient declined    Attends Engineer, structural: Not on file    Marital Status: Married     Observations/Objective: Appears well in NAD   Assessment and Plan:  See Problem List for Assessment and Plan of chronic medical problems.   Follow Up Instructions:    I discussed the assessment and treatment plan with the patient. The patient was provided an opportunity to ask questions and all were answered. The patient agreed with the plan and demonstrated an understanding of the instructions.   The patient was advised to call back or seek an in-person evaluation if the symptoms worsen or if the  condition fails to improve as anticipated.    Binnie Rail, MD

## 2022-12-11 ENCOUNTER — Telehealth (INDEPENDENT_AMBULATORY_CARE_PROVIDER_SITE_OTHER): Payer: 59 | Admitting: Internal Medicine

## 2022-12-11 ENCOUNTER — Encounter: Payer: Self-pay | Admitting: Internal Medicine

## 2022-12-11 DIAGNOSIS — R519 Headache, unspecified: Secondary | ICD-10-CM

## 2022-12-11 DIAGNOSIS — J3489 Other specified disorders of nose and nasal sinuses: Secondary | ICD-10-CM

## 2022-12-11 DIAGNOSIS — E669 Obesity, unspecified: Secondary | ICD-10-CM

## 2022-12-11 DIAGNOSIS — E1169 Type 2 diabetes mellitus with other specified complication: Secondary | ICD-10-CM

## 2022-12-11 DIAGNOSIS — Z7984 Long term (current) use of oral hypoglycemic drugs: Secondary | ICD-10-CM

## 2022-12-12 ENCOUNTER — Encounter: Payer: Self-pay | Admitting: Internal Medicine

## 2022-12-13 ENCOUNTER — Other Ambulatory Visit: Payer: Self-pay | Admitting: Internal Medicine

## 2022-12-17 ENCOUNTER — Other Ambulatory Visit: Payer: Self-pay | Admitting: Internal Medicine

## 2022-12-17 ENCOUNTER — Other Ambulatory Visit: Payer: Self-pay

## 2022-12-17 MED ORDER — SEMAGLUTIDE (2 MG/DOSE) 8 MG/3ML ~~LOC~~ SOPN: 2.0000 mg | PEN_INJECTOR | SUBCUTANEOUS | 5 refills | Status: DC

## 2022-12-21 ENCOUNTER — Other Ambulatory Visit: Payer: Self-pay | Admitting: Internal Medicine

## 2022-12-21 MED ORDER — OZEMPIC (0.25 OR 0.5 MG/DOSE) 2 MG/3ML ~~LOC~~ SOPN
PEN_INJECTOR | SUBCUTANEOUS | 1 refills | Status: DC
Start: 2022-12-21 — End: 2023-01-01

## 2023-01-01 ENCOUNTER — Ambulatory Visit: Payer: 59 | Admitting: Internal Medicine

## 2023-01-01 ENCOUNTER — Encounter: Payer: Self-pay | Admitting: Internal Medicine

## 2023-01-01 VITALS — BP 118/72 | HR 75 | Temp 98.0°F | Ht 75.0 in | Wt 309.0 lb

## 2023-01-01 DIAGNOSIS — Z6838 Body mass index (BMI) 38.0-38.9, adult: Secondary | ICD-10-CM

## 2023-01-01 DIAGNOSIS — E669 Obesity, unspecified: Secondary | ICD-10-CM

## 2023-01-01 DIAGNOSIS — Z7985 Long-term (current) use of injectable non-insulin antidiabetic drugs: Secondary | ICD-10-CM | POA: Diagnosis not present

## 2023-01-01 DIAGNOSIS — I10 Essential (primary) hypertension: Secondary | ICD-10-CM | POA: Diagnosis not present

## 2023-01-01 DIAGNOSIS — Z7984 Long term (current) use of oral hypoglycemic drugs: Secondary | ICD-10-CM | POA: Diagnosis not present

## 2023-01-01 DIAGNOSIS — E119 Type 2 diabetes mellitus without complications: Secondary | ICD-10-CM

## 2023-01-01 DIAGNOSIS — E1169 Type 2 diabetes mellitus with other specified complication: Secondary | ICD-10-CM

## 2023-01-01 LAB — COMPREHENSIVE METABOLIC PANEL
ALT: 21 U/L (ref 0–53)
AST: 21 U/L (ref 0–37)
Albumin: 4.3 g/dL (ref 3.5–5.2)
Alkaline Phosphatase: 54 U/L (ref 39–117)
BUN: 16 mg/dL (ref 6–23)
CO2: 29 mEq/L (ref 19–32)
Calcium: 9.6 mg/dL (ref 8.4–10.5)
Chloride: 100 mEq/L (ref 96–112)
Creatinine, Ser: 0.99 mg/dL (ref 0.40–1.50)
GFR: 82.25 mL/min (ref >60.00)
Glucose, Bld: 136 mg/dL — ABNORMAL HIGH (ref 70–99)
Potassium: 3.9 mEq/L (ref 3.5–5.1)
Sodium: 135 mEq/L (ref 135–145)
Total Bilirubin: 1.1 mg/dL (ref 0.2–1.2)
Total Protein: 7.2 g/dL (ref 6.0–8.3)

## 2023-01-01 LAB — HEMOGLOBIN A1C: Hgb A1c MFr Bld: 6 % (ref 4.6–6.5)

## 2023-01-01 MED ORDER — TADALAFIL 20 MG PO TABS: ORAL_TABLET | ORAL | 5 refills | Status: DC

## 2023-01-01 MED ORDER — PRAVASTATIN SODIUM 20 MG PO TABS: 20.0000 mg | ORAL_TABLET | Freq: Every day | ORAL | 3 refills | Status: DC

## 2023-01-01 MED ORDER — TRIAMTERENE-HCTZ 37.5-25 MG PO TABS: 1.0000 | ORAL_TABLET | Freq: Every day | ORAL | 3 refills | Status: DC

## 2023-01-01 MED ORDER — TRIAMCINOLONE ACETONIDE 0.5 % EX CREA: 1.0000 | TOPICAL_CREAM | Freq: Three times a day (TID) | CUTANEOUS | 1 refills | Status: AC

## 2023-01-01 MED ORDER — SEMAGLUTIDE (1 MG/DOSE) 4 MG/3ML ~~LOC~~ SOPN: 1.0000 mg | PEN_INJECTOR | SUBCUTANEOUS | 3 refills | Status: DC

## 2023-01-01 MED ORDER — METFORMIN HCL 1000 MG PO TABS
ORAL_TABLET | ORAL | 3 refills | Status: DC
Start: 2023-01-01 — End: 2023-11-04

## 2023-01-01 MED ORDER — TELMISARTAN 80 MG PO TABS
80.0000 mg | ORAL_TABLET | Freq: Every day | ORAL | 3 refills | Status: DC
Start: 2023-01-01 — End: 2023-11-04

## 2023-01-01 NOTE — Assessment & Plan Note (Signed)
Mounjaro 15 mg is n/a - on Ozempic now
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
## 2023-01-02 ENCOUNTER — Encounter: Payer: Self-pay | Admitting: Internal Medicine

## 2023-05-06 ENCOUNTER — Ambulatory Visit: Payer: 59 | Admitting: Internal Medicine

## 2023-05-06 ENCOUNTER — Encounter: Payer: Self-pay | Admitting: Internal Medicine

## 2023-05-06 VITALS — BP 118/78 | HR 73 | Temp 98.2°F | Ht 75.0 in | Wt 312.0 lb

## 2023-05-06 DIAGNOSIS — N401 Enlarged prostate with lower urinary tract symptoms: Secondary | ICD-10-CM

## 2023-05-06 DIAGNOSIS — I10 Essential (primary) hypertension: Secondary | ICD-10-CM

## 2023-05-06 DIAGNOSIS — E669 Obesity, unspecified: Secondary | ICD-10-CM | POA: Diagnosis not present

## 2023-05-06 DIAGNOSIS — E1169 Type 2 diabetes mellitus with other specified complication: Secondary | ICD-10-CM | POA: Diagnosis not present

## 2023-05-06 DIAGNOSIS — Z7985 Long-term (current) use of injectable non-insulin antidiabetic drugs: Secondary | ICD-10-CM

## 2023-05-06 MED ORDER — SEMAGLUTIDE (1 MG/DOSE) 4 MG/3ML ~~LOC~~ SOPN: 1.0000 mg | PEN_INJECTOR | SUBCUTANEOUS | 3 refills | Status: DC

## 2023-05-06 NOTE — Assessment & Plan Note (Signed)
  On diet  

## 2023-05-06 NOTE — Assessment & Plan Note (Signed)
Pt never started Ozempic 1 mg yet.Marland KitchenMarland KitchenStart today
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
## 2023-05-06 NOTE — Assessment & Plan Note (Signed)
1-2 per night

## 2023-05-06 NOTE — Assessment & Plan Note (Signed)
Reduce Maxzide to 1/2 tab if low BP 

## 2023-05-27 ENCOUNTER — Other Ambulatory Visit: Payer: Self-pay | Admitting: Internal Medicine

## 2023-07-11 ENCOUNTER — Ambulatory Visit: Payer: 59 | Admitting: Internal Medicine

## 2023-08-06 ENCOUNTER — Encounter: Payer: Self-pay | Admitting: Internal Medicine

## 2023-08-06 ENCOUNTER — Ambulatory Visit: Payer: 59 | Admitting: Internal Medicine

## 2023-08-06 VITALS — BP 110/70 | HR 77 | Temp 98.5°F | Ht 75.0 in | Wt 313.0 lb

## 2023-08-06 DIAGNOSIS — Z7985 Long-term (current) use of injectable non-insulin antidiabetic drugs: Secondary | ICD-10-CM

## 2023-08-06 DIAGNOSIS — L97509 Non-pressure chronic ulcer of other part of unspecified foot with unspecified severity: Secondary | ICD-10-CM | POA: Insufficient documentation

## 2023-08-06 DIAGNOSIS — E1169 Type 2 diabetes mellitus with other specified complication: Secondary | ICD-10-CM | POA: Diagnosis not present

## 2023-08-06 DIAGNOSIS — L97521 Non-pressure chronic ulcer of other part of left foot limited to breakdown of skin: Secondary | ICD-10-CM

## 2023-08-06 DIAGNOSIS — L97511 Non-pressure chronic ulcer of other part of right foot limited to breakdown of skin: Secondary | ICD-10-CM | POA: Diagnosis not present

## 2023-08-06 DIAGNOSIS — E119 Type 2 diabetes mellitus without complications: Secondary | ICD-10-CM

## 2023-08-06 DIAGNOSIS — E669 Obesity, unspecified: Secondary | ICD-10-CM

## 2023-08-06 LAB — COMPREHENSIVE METABOLIC PANEL
ALT: 17 U/L (ref 0–53)
AST: 17 U/L (ref 0–37)
Albumin: 4.3 g/dL (ref 3.5–5.2)
Alkaline Phosphatase: 61 U/L (ref 39–117)
BUN: 13 mg/dL (ref 6–23)
CO2: 30 meq/L (ref 19–32)
Calcium: 9.5 mg/dL (ref 8.4–10.5)
Chloride: 101 meq/L (ref 96–112)
Creatinine, Ser: 1 mg/dL (ref 0.40–1.50)
GFR: 80.92 mL/min (ref >60.00)
Glucose, Bld: 125 mg/dL — ABNORMAL HIGH (ref 70–99)
Potassium: 4.4 meq/L (ref 3.5–5.1)
Sodium: 138 meq/L (ref 135–145)
Total Bilirubin: 1.1 mg/dL (ref 0.2–1.2)
Total Protein: 7.5 g/dL (ref 6.0–8.3)

## 2023-08-06 LAB — CBC WITH DIFFERENTIAL/PLATELET
Basophils Absolute: 0 10*3/uL (ref 0.0–0.1)
Basophils Relative: 0.7 % (ref 0.0–3.0)
Eosinophils Absolute: 0.1 10*3/uL (ref 0.0–0.7)
Eosinophils Relative: 0.9 % (ref 0.0–5.0)
HCT: 44.7 % (ref 39.0–52.0)
Hemoglobin: 15.6 g/dL (ref 13.0–17.0)
Lymphocytes Relative: 33 % (ref 12.0–46.0)
Lymphs Abs: 2.2 10*3/uL (ref 0.7–4.0)
MCHC: 34.8 g/dL (ref 30.0–36.0)
MCV: 93.6 fL (ref 78.0–100.0)
Monocytes Absolute: 0.4 10*3/uL (ref 0.1–1.0)
Monocytes Relative: 6.2 % (ref 3.0–12.0)
Neutro Abs: 4 10*3/uL (ref 1.4–7.7)
Neutrophils Relative %: 59.2 % (ref 43.0–77.0)
Platelets: 180 10*3/uL (ref 150.0–400.0)
RBC: 4.78 Mil/uL (ref 4.22–5.81)
RDW: 14.6 % (ref 11.5–15.5)
WBC: 6.8 10*3/uL (ref 4.0–10.5)

## 2023-08-06 LAB — HEMOGLOBIN A1C: Hgb A1c MFr Bld: 7.1 % — ABNORMAL HIGH (ref 4.6–6.5)

## 2023-08-06 MED ORDER — SEMAGLUTIDE (2 MG/DOSE) 8 MG/3ML ~~LOC~~ SOPN: 2.0000 mg | PEN_INJECTOR | SUBCUTANEOUS | 5 refills | Status: DC

## 2023-08-06 NOTE — Assessment & Plan Note (Signed)
Pt never started Ozempic 1 mg yet.Marland KitchenMarland KitchenStart today Increase Ozempic to 2 mg/d

## 2023-08-06 NOTE — Assessment & Plan Note (Signed)
Increase Ozempic to 2 mg/d
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
## 2023-08-06 NOTE — Assessment & Plan Note (Signed)
In a boot F/u w/Dr Harriet Pho

## 2023-08-08 ENCOUNTER — Encounter: Payer: Self-pay | Admitting: Internal Medicine

## 2023-08-12 ENCOUNTER — Encounter: Payer: Self-pay | Admitting: Internal Medicine

## 2023-09-24 ENCOUNTER — Other Ambulatory Visit (HOSPITAL_COMMUNITY): Payer: Self-pay

## 2023-10-28 ENCOUNTER — Encounter (HOSPITAL_COMMUNITY): Payer: Self-pay

## 2023-11-04 ENCOUNTER — Ambulatory Visit: Payer: 59 | Admitting: Internal Medicine

## 2023-11-04 ENCOUNTER — Encounter: Payer: Self-pay | Admitting: Internal Medicine

## 2023-11-04 VITALS — BP 110/68 | HR 76 | Temp 98.1°F | Ht 75.0 in | Wt 297.0 lb

## 2023-11-04 DIAGNOSIS — E538 Deficiency of other specified B group vitamins: Secondary | ICD-10-CM | POA: Insufficient documentation

## 2023-11-04 DIAGNOSIS — D485 Neoplasm of uncertain behavior of skin: Secondary | ICD-10-CM

## 2023-11-04 DIAGNOSIS — E1169 Type 2 diabetes mellitus with other specified complication: Secondary | ICD-10-CM

## 2023-11-04 DIAGNOSIS — E119 Type 2 diabetes mellitus without complications: Secondary | ICD-10-CM

## 2023-11-04 DIAGNOSIS — Z7985 Long-term (current) use of injectable non-insulin antidiabetic drugs: Secondary | ICD-10-CM

## 2023-11-04 DIAGNOSIS — I10 Essential (primary) hypertension: Secondary | ICD-10-CM | POA: Diagnosis not present

## 2023-11-04 DIAGNOSIS — Z85820 Personal history of malignant melanoma of skin: Secondary | ICD-10-CM

## 2023-11-04 DIAGNOSIS — M545 Low back pain, unspecified: Secondary | ICD-10-CM | POA: Diagnosis not present

## 2023-11-04 DIAGNOSIS — E1142 Type 2 diabetes mellitus with diabetic polyneuropathy: Secondary | ICD-10-CM | POA: Insufficient documentation

## 2023-11-04 DIAGNOSIS — E782 Mixed hyperlipidemia: Secondary | ICD-10-CM | POA: Diagnosis not present

## 2023-11-04 DIAGNOSIS — R635 Abnormal weight gain: Secondary | ICD-10-CM | POA: Insufficient documentation

## 2023-11-04 DIAGNOSIS — E1165 Type 2 diabetes mellitus with hyperglycemia: Secondary | ICD-10-CM | POA: Insufficient documentation

## 2023-11-04 DIAGNOSIS — E669 Obesity, unspecified: Secondary | ICD-10-CM

## 2023-11-04 MED ORDER — PRAVASTATIN SODIUM 20 MG PO TABS: 20.0000 mg | ORAL_TABLET | Freq: Every day | ORAL | 3 refills | Status: DC

## 2023-11-04 MED ORDER — METFORMIN HCL 1000 MG PO TABS: ORAL_TABLET | ORAL | 3 refills | Status: DC

## 2023-11-04 MED ORDER — TADALAFIL 20 MG PO TABS: ORAL_TABLET | ORAL | 5 refills | Status: DC

## 2023-11-04 MED ORDER — TRIAMTERENE-HCTZ 37.5-25 MG PO TABS: 1.0000 | ORAL_TABLET | Freq: Every day | ORAL | 3 refills | Status: DC

## 2023-11-04 MED ORDER — TELMISARTAN 80 MG PO TABS: 80.0000 mg | ORAL_TABLET | Freq: Every day | ORAL | 3 refills | Status: DC

## 2023-11-04 NOTE — Assessment & Plan Note (Addendum)
 Derm in July Melanoma - resected

## 2023-11-04 NOTE — Assessment & Plan Note (Signed)
 Back on Ozempic 

## 2023-11-04 NOTE — Assessment & Plan Note (Signed)
 2025 scalp - ?SK Derm appt in July

## 2023-11-04 NOTE — Assessment & Plan Note (Signed)
 Doing well

## 2023-11-04 NOTE — Assessment & Plan Note (Signed)
 Seeing Dr Vick Gram w/[facility] on Mounjaro 

## 2023-11-04 NOTE — Assessment & Plan Note (Signed)
Reduce Maxzide to 1/2 tab if low BP 
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
## 2023-11-04 NOTE — Assessment & Plan Note (Signed)
On Pravastatin 

## 2024-01-30 ENCOUNTER — Other Ambulatory Visit: Payer: Self-pay | Admitting: Internal Medicine

## 2024-03-27 ENCOUNTER — Other Ambulatory Visit: Payer: Self-pay | Admitting: Internal Medicine

## 2024-05-06 ENCOUNTER — Encounter: Payer: Self-pay | Admitting: Internal Medicine

## 2024-05-06 ENCOUNTER — Ambulatory Visit: Admitting: Internal Medicine

## 2024-05-06 VITALS — BP 116/78 | HR 60 | Ht 75.0 in | Wt 291.4 lb

## 2024-05-06 DIAGNOSIS — Z0000 Encounter for general adult medical examination without abnormal findings: Secondary | ICD-10-CM

## 2024-05-06 DIAGNOSIS — E782 Mixed hyperlipidemia: Secondary | ICD-10-CM | POA: Diagnosis not present

## 2024-05-06 DIAGNOSIS — E119 Type 2 diabetes mellitus without complications: Secondary | ICD-10-CM | POA: Diagnosis not present

## 2024-05-06 DIAGNOSIS — E669 Obesity, unspecified: Secondary | ICD-10-CM

## 2024-05-06 DIAGNOSIS — E538 Deficiency of other specified B group vitamins: Secondary | ICD-10-CM

## 2024-05-06 DIAGNOSIS — Z7985 Long-term (current) use of injectable non-insulin antidiabetic drugs: Secondary | ICD-10-CM

## 2024-05-06 DIAGNOSIS — M545 Low back pain, unspecified: Secondary | ICD-10-CM

## 2024-05-06 DIAGNOSIS — Z125 Encounter for screening for malignant neoplasm of prostate: Secondary | ICD-10-CM | POA: Diagnosis not present

## 2024-05-06 DIAGNOSIS — E559 Vitamin D deficiency, unspecified: Secondary | ICD-10-CM

## 2024-05-06 DIAGNOSIS — Z6836 Body mass index (BMI) 36.0-36.9, adult: Secondary | ICD-10-CM

## 2024-05-06 LAB — CBC WITH DIFFERENTIAL/PLATELET
Basophils Absolute: 0 K/uL (ref 0.0–0.1)
Basophils Relative: 0.6 % (ref 0.0–3.0)
Eosinophils Absolute: 0.1 K/uL (ref 0.0–0.7)
Eosinophils Relative: 1.2 % (ref 0.0–5.0)
HCT: 44.1 % (ref 39.0–52.0)
Hemoglobin: 15.7 g/dL (ref 13.0–17.0)
Lymphocytes Relative: 31.5 % (ref 12.0–46.0)
Lymphs Abs: 2 K/uL (ref 0.7–4.0)
MCHC: 35.6 g/dL (ref 30.0–36.0)
MCV: 92.1 fl (ref 78.0–100.0)
Monocytes Absolute: 0.4 K/uL (ref 0.1–1.0)
Monocytes Relative: 6.9 % (ref 3.0–12.0)
Neutro Abs: 3.9 K/uL (ref 1.4–7.7)
Neutrophils Relative %: 59.8 % (ref 43.0–77.0)
Platelets: 183 K/uL (ref 150.0–400.0)
RBC: 4.79 Mil/uL (ref 4.22–5.81)
RDW: 14.4 % (ref 11.5–15.5)
WBC: 6.5 K/uL (ref 4.0–10.5)

## 2024-05-06 LAB — COMPREHENSIVE METABOLIC PANEL WITH GFR
ALT: 18 U/L (ref 0–53)
AST: 19 U/L (ref 0–37)
Albumin: 4.2 g/dL (ref 3.5–5.2)
Alkaline Phosphatase: 54 U/L (ref 39–117)
BUN: 14 mg/dL (ref 6–23)
CO2: 26 meq/L (ref 19–32)
Calcium: 9.1 mg/dL (ref 8.4–10.5)
Chloride: 101 meq/L (ref 96–112)
Creatinine, Ser: 0.98 mg/dL (ref 0.40–1.50)
GFR: 82.47 mL/min (ref >60.00)
Glucose, Bld: 82 mg/dL (ref 70–99)
Potassium: 4 meq/L (ref 3.5–5.1)
Sodium: 138 meq/L (ref 135–145)
Total Bilirubin: 1 mg/dL (ref 0.2–1.2)
Total Protein: 6.8 g/dL (ref 6.0–8.3)

## 2024-05-06 LAB — URINALYSIS
Bilirubin Urine: NEGATIVE
Hgb urine dipstick: NEGATIVE
Ketones, ur: NEGATIVE
Leukocytes,Ua: NEGATIVE
Nitrite: NEGATIVE
Specific Gravity, Urine: 1.02 (ref 1.000–1.030)
Total Protein, Urine: NEGATIVE
Urine Glucose: NEGATIVE
Urobilinogen, UA: 1 (ref 0.0–1.0)
pH: 6.5 (ref 5.0–8.0)

## 2024-05-06 LAB — LIPID PANEL
Cholesterol: 116 mg/dL (ref 0–200)
HDL: 36 mg/dL — ABNORMAL LOW (ref >39.00)
LDL Cholesterol: 51 mg/dL (ref 0–99)
NonHDL: 79.67
Total CHOL/HDL Ratio: 3
Triglycerides: 142 mg/dL (ref 0.0–149.0)
VLDL: 28.4 mg/dL (ref 0.0–40.0)

## 2024-05-06 LAB — TSH: TSH: 2.63 u[IU]/mL (ref 0.35–5.50)

## 2024-05-06 LAB — PSA: PSA: 2.33 ng/mL (ref 0.10–4.00)

## 2024-05-06 MED ORDER — TADALAFIL 20 MG PO TABS: 20.0000 mg | ORAL_TABLET | ORAL | 5 refills | Status: AC

## 2024-05-06 MED ORDER — PRAVASTATIN SODIUM 20 MG PO TABS: 20.0000 mg | ORAL_TABLET | Freq: Every day | ORAL | 3 refills | Status: AC

## 2024-05-06 MED ORDER — TRIAMTERENE-HCTZ 37.5-25 MG PO TABS
1.0000 | ORAL_TABLET | Freq: Every day | ORAL | 3 refills | Status: AC
Start: 2024-05-06 — End: ?

## 2024-05-06 MED ORDER — METFORMIN HCL 1000 MG PO TABS: ORAL_TABLET | ORAL | 3 refills | Status: AC

## 2024-05-06 MED ORDER — TELMISARTAN 40 MG PO TABS: 40.0000 mg | ORAL_TABLET | Freq: Every day | ORAL | 3 refills | Status: AC

## 2024-05-06 NOTE — Assessment & Plan Note (Signed)
 Back on Ozempic 

## 2024-05-06 NOTE — Patient Instructions (Signed)
 Amlactin lotion for rough skin

## 2024-05-06 NOTE — Assessment & Plan Note (Signed)
 Seeing Dr Braulio w/[facility] On Mounjaro  15 mg, Metformin 

## 2024-05-06 NOTE — Assessment & Plan Note (Signed)
On Pravastatin 

## 2024-05-06 NOTE — Assessment & Plan Note (Signed)
 On B12

## 2024-05-06 NOTE — Assessment & Plan Note (Signed)
 On Vit D
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
## 2024-05-06 NOTE — Assessment & Plan Note (Signed)
 Doing well

## 2024-05-11 ENCOUNTER — Ambulatory Visit: Payer: Self-pay | Admitting: Internal Medicine

## 2024-05-20 ENCOUNTER — Ambulatory Visit: Admitting: Podiatry

## 2024-05-20 DIAGNOSIS — E1142 Type 2 diabetes mellitus with diabetic polyneuropathy: Secondary | ICD-10-CM | POA: Diagnosis not present

## 2024-05-20 DIAGNOSIS — S90422A Blister (nonthermal), left great toe, initial encounter: Secondary | ICD-10-CM

## 2024-05-20 DIAGNOSIS — L97521 Non-pressure chronic ulcer of other part of left foot limited to breakdown of skin: Secondary | ICD-10-CM | POA: Diagnosis not present

## 2024-05-20 MED ORDER — DOXYCYCLINE HYCLATE 100 MG PO TABS: 100.0000 mg | ORAL_TABLET | Freq: Two times a day (BID) | ORAL | 1 refills | Status: AC

## 2024-05-23 NOTE — Progress Notes (Signed)
 Subjective:   Patient ID: Joel Medina, male   DOB: 63 y.o.   MRN: [id number]   HPI Patient presents with caregiver stating that he was very active on his feet and playing golf and he developed a large blood blister on his left big toe.  States that he does have diabetes tries to keep it under control and has severe foot structural pathology bilateral.  Patient does not smoke and likes to be active   Review of Systems  All other systems reviewed and are negative.       Objective:  Physical Exam Vitals and nursing note reviewed.  Constitutional:      Appearance: He is well-developed.  Pulmonary:     Effort: Pulmonary effort is normal.  Musculoskeletal:        General: Normal range of motion.  Skin:    General: Skin is warm.  Neurological:     Mental Status: He is alert.     Neurovascular status indicated good circulatory status moderate diminishment sharp dull vibratory with severe foot structural issues with bunion deformity hammertoe deformity noted bilateral with large keratotic tissue formation.  Left hallux at the inner phalangeal joint there is a large blood blister discolored and despite moderate neuropathy it is painful when pressed.  Good digital perfusion well-oriented     Assessment:  Probability for traumatic ulceration left hallux secondary to activity levels creating this     Plan:  H&P reviewed condition and at this point educated him on diabetes and shoe gear to be careful with.  Sharp sterile debridement of ulceration noted there is only superficial breakdown of tissue I did not note subcutaneous bone or tendon exposure.  I flushed the area out applied Iodosorb instructed on home wet-to-dry dressings and keeping it covered.  It should heal uneventfully no indication systemic infection temperature normal no other problems and I gave him strict instructions if any change should occur to call us  immediately but it should heal uneventfully

## 2024-05-29 LAB — OPHTHALMOLOGY REPORT-SCANNED

## 2024-07-02 ENCOUNTER — Ambulatory Visit: Admitting: Orthopedic Surgery

## 2024-07-02 ENCOUNTER — Other Ambulatory Visit: Payer: Self-pay

## 2024-07-02 DIAGNOSIS — M79672 Pain in left foot: Secondary | ICD-10-CM | POA: Diagnosis not present

## 2024-07-02 DIAGNOSIS — L97521 Non-pressure chronic ulcer of other part of left foot limited to breakdown of skin: Secondary | ICD-10-CM

## 2024-07-06 ENCOUNTER — Encounter: Payer: Self-pay | Admitting: Orthopedic Surgery

## 2024-07-06 ENCOUNTER — Telehealth: Payer: Self-pay | Admitting: Orthopedic Surgery

## 2024-07-06 ENCOUNTER — Ambulatory Visit: Admitting: Orthopedic Surgery

## 2024-07-06 NOTE — Progress Notes (Signed)
 "  Office Visit Note   Patient: Joel Medina           Date of Birth: 06-07-1961           MRN: [id number] Visit Date: 07/02/2024              Requested by: Garald Karlynn GAILS, MD [address] [place],  [place] [address] PCP: Garald Karlynn GAILS, MD  Chief Complaint  Patient presents with   Left Foot - Wound Check      HPI: Discussed the use of AI scribe software for clinical note transcription with the patient, who gave verbal consent to proceed.  History of Present Illness Joel Medina is a 64 year old male with type 2 diabetes, diabetic neuropathy, and prior left foot surgery who presents for evaluation of a chronic left great toe ulcer with recent worsening and concern for infection.  He has a chronic ulcer located beneath the left great toe, which has acutely worsened over the past week with new malodor and purulent drainage. He initiated cephalexin  and sulfamethoxazole/trimethoprim three days ago after his sister observed signs of infection. He has monitored the wound and notes improvement, with decreased dried blood and increased granulation tissue. He denies pain at the site, attributed to neuropathic loss of sensation, and has not experienced fevers or other systemic symptoms.  He underwent bunionectomy at age 26 and dorsal bone spur excision, resulting in residual dorsal scarring. He describes longstanding rigidity and loss of flexibility of the left foot, progressively worsening since his forties. He experiences difficulty with pressure and footwear due to deformity and rigidity, and is unable to tolerate custom orthotics secondary to increased discomfort and burning sensation.  He has attempted stretching exercises in the past, but is unsure of their efficacy due to persistent rigidity.  He has bilateral total hip arthroplasties. Family is concerned about possible infection spreading to his hips, but he has not experienced symptoms suggestive of joint infection.  His hemoglobin A1c is reportedly well controlled at 5.7.     Assessment & Plan: Visit Diagnoses:  1. Pain in left foot   2. Skin ulcer of left great toe, limited to breakdown of skin (HCC)     Plan: Assessment and Plan Assessment & Plan Left great toe ulcer with infection Chronic ulcer beneath left hallux with superficial infection, no deep extension or joint involvement. Likely due to pressure from foot deformities and neuropathy. Emphasized offloading to prevent tissue loss. MRI indicated if surgery considered to evaluate for osteomyelitis. - Debrided devitalized tissue to healthy granulation. - Recommended completion of antibiotics (cephalexin , sulfamethoxazole/trimethoprim). - Advised daily cleansing with soap and water . - Applied topical antibiotic ointment, covered with gauze. - Applied Band-Aid, recommended continued wound coverage. - Placed in postoperative shoe with felt relieving donut to offload pressure. - Scheduled follow-up in two weeks to reassess healing. - Discussed need for MRI if surgical intervention considered.  Hallux rigidus, left foot Severe hallux rigidus with no range of motion, post prior bunionectomy. Rigidity causes abnormal pressure and ulcer formation. Prior surgeries complicate future planning. Surgical options discussed but deferred pending evaluation and MRI if indicated. - Placed in stiff postoperative shoe to minimize movement and offload pressure. - Discussed surgical options (e.g., osteotomy to elevate hallux), deferred pending evaluation and MRI if indicated.  Equinus contracture and Haglund's deformity, left foot Significant equinus contracture with calcification at Achilles insertion and Haglund's deformity. Limited dorsiflexion affects gait and increases forefoot pressure. Stretching may have limited efficacy  due to calcification and contracture. Surgical options discussed but deferred. - Instructed on Achilles stretching exercises (five times  daily, one minute each, knees straight, heels to floor). - Discussed limited efficacy of stretching due to calcification and contracture. - Deferred surgical intervention (Achilles lengthening) pending further evaluation.  Plantar fasciitis with calcification, left foot Extensive plantar fascia calcification causing rigidity and pain. Custom orthotics exacerbate pain. Conservative management preferred. - Advised against custom orthotics due to increased pain and pressure from calcification. - Recommended continued use of stiff shoes to minimize pressure.  Arthritis of left foot Extensive arthritic changes causing osteophyte formation, rigidity, and abnormal pressure. Chronic condition impacts foot function and ulcer risk. Surgical options unlikely curative, require further imaging before consideration. Conservative management preferred. - Ordered foot radiograph to assess extent of arthritic changes and osteophytes. - Discussed that surgical options are unlikely to be curative and would require MRI prior to consideration. - Recommended conservative management with offloading, stiff shoes, and wound care.      Follow-Up Instructions: No follow-ups on file.   Ortho Exam  Patient is alert, oriented, no adenopathy, well-dressed, normal affect, normal respiratory effort. Physical Exam EXTREMITIES: Strong palpable dorsalis pedis pulse in left foot. Significant hallux rigidus of the left great toe with no range of motion. Previous dorsal incision over left MTP joint and foot from bone spur excision. Significant equinus contracture of left ankle with Haglund's deformity and calcification. Dorsiflexion 20 degrees short of neutral with knee extended, 20 degrees past neutral with knee flexed. Ulcer beneath left great toe with odor and drainage. After informed consent a 10 blade knife was used to debride the skin and soft tissue back to healthy viable granulation tissue.  The great toe ulcer was 2 cm in  diameter prior to debridement and 3 cm in diameter after debridement with healthy granulation tissue.  There is no tunneling no exposed bone or tendon.    Imaging: No results found. No images are attached to the encounter.  Labs: Lab Results  Component Value Date   HGBA1C 7.1 (H) 08/06/2023   HGBA1C 6.0 01/01/2023   HGBA1C 5.7 07/02/2022   ESRSEDRATE 8 06/29/2016     Lab Results  Component Value Date   ALBUMIN 4.2 05/06/2024   ALBUMIN 4.3 08/06/2023   ALBUMIN 4.3 01/01/2023    No results found for: MG Lab Results  Component Value Date   VD25OH 36.9 02/04/2018   VD25OH 25.6 (L) 07/10/2017   VD25OH 35.1 04/03/2017    No results found for: PREALBUMIN    Latest Ref Rng & Units 05/06/2024   10:35 AM 08/06/2023   10:58 AM 06/20/2021    8:55 AM  CBC EXTENDED  WBC 4.0 - 10.5 K/uL 6.5  6.8  8.1   RBC 4.22 - 5.81 Mil/uL 4.79  4.78  4.78   Hemoglobin 13.0 - 17.0 g/dL 84.2  84.3  84.3   HCT 39.0 - 52.0 % 44.1  44.7  44.7   Platelets 150.0 - 400.0 K/uL 183.0  180.0  186.0   NEUT# 1.4 - 7.7 K/uL 3.9  4.0  4.3   Lymph# 0.7 - 4.0 K/uL 2.0  2.2  3.2      There is no height or weight on file to calculate BMI.  Orders:  Orders Placed This Encounter  Procedures   XR Foot Complete Left   No orders of the defined types were placed in this encounter.    Procedures: No procedures performed  Clinical Data: No additional findings.  ROS:  All other systems negative, except as noted in the HPI. Review of Systems  Objective: Vital Signs: There were no vitals taken for this visit.  Specialty Comments:  No specialty comments available.  PMFS History: Patient Active Problem List   Diagnosis Date Noted   Abnormal weight gain 11/04/2023   Polyneuropathy due to type 2 diabetes mellitus (HCC) 11/04/2023   Type 2 diabetes mellitus with hyperglycemia (HCC) 11/04/2023   Vitamin B12 deficiency (non anemic) 11/04/2023   History of melanoma 11/04/2023   Foot ulcer (HCC)  08/06/2023   URI (upper respiratory infection) 07/02/2022   Neoplasm of uncertain behavior of skin 03/29/2022   Coronary atherosclerosis 06/17/2019   Obesity, Class III, BMI 40-49.9 (morbid obesity) (HCC) 03/10/2019   Type 2 diabetes mellitus without complication, without long-term current use of insulin  (HCC) 07/10/2017   Other hyperlipidemia 07/10/2017   Mixed hyperlipidemia 04/03/2017   Vitamin D  deficiency 02/14/2017   Type 2 diabetes mellitus in patient with obesity (HCC) 03/23/2016   Low back pain 09/28/2015   Hydrocele of testis 10/05/2014   OA (osteoarthritis) of hip 02/04/2013   Preop exam for internal medicine 01/13/2013   Seborrheic dermatitis 05/11/2012   Hyperglycemia 05/09/2012   Well adult exam 05/06/2011   BPH (benign prostatic hyperplasia) 05/04/2011   Erectile dysfunction 10/30/2010   Edema 10/30/2010   DYSLIPIDEMIA 03/08/2010   Obesity 11/07/2007   HYPERTRIGLYCERIDEMIA 07/03/2007   Essential hypertension 07/03/2007   Past Medical History:  Diagnosis Date   Arthritis    OA BOTH HIPS - PAIN IN LEFT HIP WORSE   Back pain    Diabetes mellitus without complication (HCC)    oral meds   History of pyelonephritis    HTN (hypertension)    Joint pain    Obesity    Prediabetes    Swelling    feet and legs    Family History  Problem Relation Age of Onset   Hypertension Father    Obesity Father    Hypertension Mother    Cancer Mother    Depression Mother    Obesity Mother    Hypertension Other    Colon cancer Neg Hx    Colon polyps Neg Hx    Esophageal cancer Neg Hx    Rectal cancer Neg Hx    Stomach cancer Neg Hx     Past Surgical History:  Procedure Laterality Date   BUNINECTOMY - RIGHT  1977   RIGHT ULNAR NERVE SURGERY TO REMOVE SCAR TISSUE  ? 2006   TOTAL HIP ARTHROPLASTY Left 02/04/2013   Procedure: LEFT TOTAL HIP ARTHROPLASTY;  Surgeon: Dempsey LULLA Moan, MD;  Location: [facility];  Service: Orthopedics;  Laterality: Left;   TOTAL HIP ARTHROPLASTY  Right 11/24/2014   Procedure: RIGHT TOTAL HIP ARTHROPLASTY ANTERIOR APPROACH;  Surgeon: Dempsey Moan, MD;  Location: [facility];  Service: Orthopedics;  Laterality: Right;   WISDOM TEETH EXTRACTIONS  ? 41   WISDOM TOOTH EXTRACTION     Social History   Occupational History   Occupation: Advice worker [employer]  Tobacco Use   Smoking status: Never   Smokeless tobacco: Never  Substance and Sexual Activity   Alcohol use: Yes    Alcohol/week: 12.0 standard drinks of alcohol    Types: 12 Cans of beer per week   Drug use: No   Sexual activity: Yes         "

## 2024-07-06 NOTE — Telephone Encounter (Signed)
 CALLED PT LEFT VM PROVIDER WILL NOT BE IN OFFICE 1/29 NOR 2/2. PT NEED TO R/S/

## 2024-07-10 ENCOUNTER — Telehealth: Payer: Self-pay | Admitting: Orthopedic Surgery

## 2024-07-10 ENCOUNTER — Telehealth: Payer: Self-pay | Admitting: Radiology

## 2024-07-10 ENCOUNTER — Ambulatory Visit: Admitting: Physician Assistant

## 2024-07-10 DIAGNOSIS — L97521 Non-pressure chronic ulcer of other part of left foot limited to breakdown of skin: Secondary | ICD-10-CM

## 2024-07-10 DIAGNOSIS — M2022 Hallux rigidus, left foot: Secondary | ICD-10-CM

## 2024-07-10 NOTE — Progress Notes (Unsigned)
 "  Office Visit Note   Patient: Joel Medina           Date of Birth: 1960-11-25           MRN: [id number] Visit Date: 07/10/2024              Requested by: Garald Karlynn GAILS, MD [id number] [place],  [place] [address] PCP: Plotnikov, Karlynn GAILS, MD  No chief complaint on file.     HPI: 64 y/o male with chronic left foot GT ulcer.  On his last visit the left great toe, which has acutely worsened over the past week with new malodor and purulent drainage. He initiated cephalexin  and sulfamethoxazole/trimethoprim.    Past medical history.  He underwent bunionectomy at age 55 and dorsal bone spur excision, resulting in residual dorsal scarring. He describes longstanding rigidity and loss of flexibility of the left foot, progressively worsening since his for[age]. He experiences difficulty with pressure and footwear due to deformity and rigidity, and is unable to tolerate custom orthotics secondary to increased discomfort and burning sensation.   He was seen by Dr. Harden on 07/02/24.  The ulcer was debrided to healthy granulation tissue and he was instructed to complete his antibiotics.  (cephalexin ,sulfamethoxazole/trimethoprim)  He has a sister who is an MD and she suggested he stop the Bactrim and started him on Doxycycline  to prevent hyperkalemia.  His instructions were to Advised daily cleansing with soap and water .  Applied topical antibiotic ointment, covered with gauze.   He and his wife wanted to be seen to make sure   Assessment & Plan: Visit Diagnoses: No diagnosis found.  Plan: Chronic Toe ulcer with cent signs of infection.  There does not appear to be any signs of infection on exam today.  He has been on oral antibiotics as listed above.  He will continue to take them until the course is completed.   Continue current care instructions   -daily cleansing with soap and water   -clean with Vashe -topical antibiotic ointment, covered with gauze.  - we did discuss why acilles  stretching is import as well Dr. Harden mentioned early that they Discussed need for MRI if surgical intervention considered.   Follow-Up Instructions: No follow-ups on file.   Ortho Exam  Patient is alert, oriented, no adenopathy, well-dressed, normal affect, normal respiratory effort. Plantar wound left GT with 1 cm diameter 100% granulation wound bed.  Debridement of the skin edges removed dried blood staining.  No active bleeding and no surrounding cellulitis or dorsal GT cellulitis.      Imaging:        Labs: Lab Results  Component Value Date   HGBA1C 7.1 (H) 08/06/2023   HGBA1C 6.0 01/01/2023   HGBA1C 5.7 07/02/2022   ESRSEDRATE 8 06/29/2016     Lab Results  Component Value Date   ALBUMIN 4.2 05/06/2024   ALBUMIN 4.3 08/06/2023   ALBUMIN 4.3 01/01/2023    No results found for: MG Lab Results  Component Value Date   VD25OH 36.9 02/04/2018   VD25OH 25.6 (L) 07/10/2017   VD25OH 35.1 04/03/2017    No results found for: PREALBUMIN    Latest Ref Rng & Units 05/06/2024   10:35 AM 08/06/2023   10:58 AM 06/20/2021    8:55 AM  CBC EXTENDED  WBC 4.0 - 10.5 K/uL 6.5  6.8  8.1   RBC 4.22 - 5.81 Mil/uL 4.79  4.78  4.78   Hemoglobin 13.0 - 17.0 g/dL 15.7  15.6  15.6   HCT 39.0 - 52.0 % 44.1  44.7  44.7   Platelets 150.0 - 400.0 K/uL 183.0  180.0  186.0   NEUT# 1.4 - 7.7 K/uL 3.9  4.0  4.3   Lymph# 0.7 - 4.0 K/uL 2.0  2.2  3.2      There is no height or weight on file to calculate BMI.  Orders:  No orders of the defined types were placed in this encounter.  No orders of the defined types were placed in this encounter.    Procedures: No procedures performed  Clinical Data: No additional findings.  ROS:  All other systems negative, except as noted in the HPI. Review of Systems  Objective: Vital Signs: There were no vitals taken for this visit.  Specialty Comments:  No specialty comments available.  PMFS History: Patient Active Problem List    Diagnosis Date Noted   Abnormal weight gain 11/04/2023   Polyneuropathy due to type 2 diabetes mellitus (HCC) 11/04/2023   Type 2 diabetes mellitus with hyperglycemia (HCC) 11/04/2023   Vitamin B12 deficiency (non anemic) 11/04/2023   History of melanoma 11/04/2023   Foot ulcer (HCC) 08/06/2023   URI (upper respiratory infection) 07/02/2022   Neoplasm of uncertain behavior of skin 03/29/2022   Coronary atherosclerosis 06/17/2019   Obesity, Class III, BMI 40-49.9 (morbid obesity) (HCC) 03/10/2019   Type 2 diabetes mellitus without complication, without long-term current use of insulin  (HCC) 07/10/2017   Other hyperlipidemia 07/10/2017   Mixed hyperlipidemia 04/03/2017   Vitamin D  deficiency 02/14/2017   Type 2 diabetes mellitus in patient with obesity (HCC) 03/23/2016   Low back pain 09/28/2015   Hydrocele of testis 10/05/2014   OA (osteoarthritis) of hip 02/04/2013   Preop exam for internal medicine 01/13/2013   Seborrheic dermatitis 05/11/2012   Hyperglycemia 05/09/2012   Well adult exam 05/06/2011   BPH (benign prostatic hyperplasia) 05/04/2011   Erectile dysfunction 10/30/2010   Edema 10/30/2010   DYSLIPIDEMIA 03/08/2010   Obesity 11/07/2007   HYPERTRIGLYCERIDEMIA 07/03/2007   Essential hypertension 07/03/2007   Past Medical History:  Diagnosis Date   Arthritis    OA BOTH HIPS - PAIN IN LEFT HIP WORSE   Back pain    Diabetes mellitus without complication (HCC)    oral meds   History of pyelonephritis    HTN (hypertension)    Joint pain    Obesity    Prediabetes    Swelling    feet and legs    Family History  Problem Relation Age of Onset   Hypertension Father    Obesity Father    Hypertension Mother    Cancer Mother    Depression Mother    Obesity Mother    Hypertension Other    Colon cancer Neg Hx    Colon polyps Neg Hx    Esophageal cancer Neg Hx    Rectal cancer Neg Hx    Stomach cancer Neg Hx     Past Surgical History:  Procedure Laterality Date    BUNINECTOMY - RIGHT  1977   RIGHT ULNAR NERVE SURGERY TO REMOVE SCAR TISSUE  ? 2006   TOTAL HIP ARTHROPLASTY Left 02/04/2013   Procedure: LEFT TOTAL HIP ARTHROPLASTY;  Surgeon: Dempsey LULLA Moan, MD;  Location: [facility];  Service: Orthopedics;  Laterality: Left;   TOTAL HIP ARTHROPLASTY Right 11/24/2014   Procedure: RIGHT TOTAL HIP ARTHROPLASTY ANTERIOR APPROACH;  Surgeon: Dempsey Moan, MD;  Location: [facility];  Service: Orthopedics;  Laterality: Right;  WISDOM TEETH EXTRACTIONS  ? 11   WISDOM TOOTH EXTRACTION     Social History   Occupational History   Occupation: Advice worker [employer]  Tobacco Use   Smoking status: Never   Smokeless tobacco: Never  Substance and Sexual Activity   Alcohol use: Yes    Alcohol/week: 12.0 standard drinks of alcohol    Types: 12 Cans of beer per week   Drug use: No   Sexual activity: Yes       "

## 2024-07-10 NOTE — Telephone Encounter (Signed)
 SW pt, He will come in today at 1 pm for eval.

## 2024-07-10 NOTE — Telephone Encounter (Signed)
 Joel Medina patients wife called stated they are concerned about patients wound. Open wound, raw look, increased new drainage.  Seen Harden on 1/15 is suppose to return Monday concerned about inclement weather and wound with patient being diabetic.    [phone]

## 2024-07-13 ENCOUNTER — Ambulatory Visit: Admitting: Orthopedic Surgery

## 2024-07-16 ENCOUNTER — Ambulatory Visit: Admitting: Orthopedic Surgery

## 2024-07-24 ENCOUNTER — Ambulatory Visit: Admitting: Physician Assistant

## 2024-07-24 NOTE — Progress Notes (Unsigned)
 "  Office Visit Note   Patient: Joel Medina           Date of Birth: 1961/04/09           MRN: [id number] Visit Date: 07/24/2024              Requested by: Garald Karlynn GAILS, MD [address] [place],  [place] [address] PCP: Plotnikov, Karlynn GAILS, MD  Chief Complaint  Patient presents with   Left Foot - Wound Check      HPI: ***  Assessment & Plan: Visit Diagnoses: No diagnosis found.  Plan: ***  Follow-Up Instructions: No follow-ups on file.   Ortho Exam  Patient is alert, oriented, no adenopathy, well-dressed, normal affect, normal respiratory effort. ***    Imaging: No results found. No images are attached to the encounter.  Labs: Lab Results  Component Value Date   HGBA1C 7.1 (H) 08/06/2023   HGBA1C 6.0 01/01/2023   HGBA1C 5.7 07/02/2022   ESRSEDRATE 8 06/29/2016     Lab Results  Component Value Date   ALBUMIN 4.2 05/06/2024   ALBUMIN 4.3 08/06/2023   ALBUMIN 4.3 01/01/2023    No results found for: MG Lab Results  Component Value Date   VD25OH 36.9 02/04/2018   VD25OH 25.6 (L) 07/10/2017   VD25OH 35.1 04/03/2017    No results found for: PREALBUMIN    Latest Ref Rng & Units 05/06/2024   10:35 AM 08/06/2023   10:58 AM 06/20/2021    8:55 AM  CBC EXTENDED  WBC 4.0 - 10.5 K/uL 6.5  6.8  8.1   RBC 4.22 - 5.81 Mil/uL 4.79  4.78  4.78   Hemoglobin 13.0 - 17.0 g/dL 84.2  84.3  84.3   HCT 39.0 - 52.0 % 44.1  44.7  44.7   Platelets 150.0 - 400.0 K/uL 183.0  180.0  186.0   NEUT# 1.4 - 7.7 K/uL 3.9  4.0  4.3   Lymph# 0.7 - 4.0 K/uL 2.0  2.2  3.2      There is no height or weight on file to calculate BMI.  Orders:  No orders of the defined types were placed in this encounter.  No orders of the defined types were placed in this encounter.    Procedures: No procedures performed  Clinical Data: No additional findings.  ROS:  All other systems negative, except as noted in the HPI. Review of Systems  Objective: Vital Signs:  There were no vitals taken for this visit.  Specialty Comments:  No specialty comments available.  PMFS History: Patient Active Problem List   Diagnosis Date Noted   Abnormal weight gain 11/04/2023   Polyneuropathy due to type 2 diabetes mellitus (HCC) 11/04/2023   Type 2 diabetes mellitus with hyperglycemia (HCC) 11/04/2023   Vitamin B12 deficiency (non anemic) 11/04/2023   History of melanoma 11/04/2023   Foot ulcer (HCC) 08/06/2023   URI (upper respiratory infection) 07/02/2022   Neoplasm of uncertain behavior of skin 03/29/2022   Coronary atherosclerosis 06/17/2019   Obesity, Class III, BMI 40-49.9 (morbid obesity) (HCC) 03/10/2019   Type 2 diabetes mellitus without complication, without long-term current use of insulin  (HCC) 07/10/2017   Other hyperlipidemia 07/10/2017   Mixed hyperlipidemia 04/03/2017   Vitamin D  deficiency 02/14/2017   Type 2 diabetes mellitus in patient with obesity (HCC) 03/23/2016   Low back pain 09/28/2015   Hydrocele of testis 10/05/2014   OA (osteoarthritis) of hip 02/04/2013   Preop exam for internal medicine 01/13/2013  Seborrheic dermatitis 05/11/2012   Hyperglycemia 05/09/2012   Well adult exam 05/06/2011   BPH (benign prostatic hyperplasia) 05/04/2011   Erectile dysfunction 10/30/2010   Edema 10/30/2010   DYSLIPIDEMIA 03/08/2010   Obesity 11/07/2007   HYPERTRIGLYCERIDEMIA 07/03/2007   Essential hypertension 07/03/2007   Past Medical History:  Diagnosis Date   Arthritis    OA BOTH HIPS - PAIN IN LEFT HIP WORSE   Back pain    Diabetes mellitus without complication (HCC)    oral meds   History of pyelonephritis    HTN (hypertension)    Joint pain    Obesity    Prediabetes    Swelling    feet and legs    Family History  Problem Relation Age of Onset   Hypertension Father    Obesity Father    Hypertension Mother    Cancer Mother    Depression Mother    Obesity Mother     Hypertension Other    Colon cancer Neg Hx    Colon polyps Neg Hx    Esophageal cancer Neg Hx    Rectal cancer Neg Hx    Stomach cancer Neg Hx     Past Surgical History:  Procedure Laterality Date   BUNINECTOMY - RIGHT  1977   RIGHT ULNAR NERVE SURGERY TO REMOVE SCAR TISSUE  ? 2006   TOTAL HIP ARTHROPLASTY Left 02/04/2013   Procedure: LEFT TOTAL HIP ARTHROPLASTY;  Surgeon: Dempsey LULLA Moan, MD;  Location: [facility];  Service: Orthopedics;  Laterality: Left;   TOTAL HIP ARTHROPLASTY Right 11/24/2014   Procedure: RIGHT TOTAL HIP ARTHROPLASTY ANTERIOR APPROACH;  Surgeon: Dempsey Moan, MD;  Location: [facility];  Service: Orthopedics;  Laterality: Right;   WISDOM TEETH EXTRACTIONS  ? 5   WISDOM TOOTH EXTRACTION     Social History   Occupational History   Occupation: Advice worker [employer]  Tobacco Use   Smoking status: Never   Smokeless tobacco: Never  Substance and Sexual Activity   Alcohol use: Yes    Alcohol/week: 12.0 standard drinks of alcohol    Types: 12 Cans of beer per week   Drug use: No   Sexual activity: Yes       "

## 2024-11-03 ENCOUNTER — Ambulatory Visit
